# Patient Record
Sex: Female | Born: 1940 | Race: White | Hispanic: No | Smoking: Former smoker
Health system: Southern US, Community
[De-identification: ages and names within clinical notes are randomized; demographics above are authoritative.]

## PROBLEM LIST (undated history)

## (undated) DIAGNOSIS — E0500 Thyrotoxicosis with diffuse goiter without thyrotoxic crisis or storm: Secondary | ICD-10-CM

## (undated) DIAGNOSIS — I10 Essential (primary) hypertension: Secondary | ICD-10-CM

## (undated) DIAGNOSIS — R937 Abnormal findings on diagnostic imaging of other parts of musculoskeletal system: Secondary | ICD-10-CM

## (undated) DIAGNOSIS — Z720 Tobacco use: Secondary | ICD-10-CM

## (undated) DIAGNOSIS — K5792 Diverticulitis of intestine, part unspecified, without perforation or abscess without bleeding: Secondary | ICD-10-CM

## (undated) DIAGNOSIS — E0590 Thyrotoxicosis, unspecified without thyrotoxic crisis or storm: Secondary | ICD-10-CM

## (undated) DIAGNOSIS — K589 Irritable bowel syndrome without diarrhea: Secondary | ICD-10-CM

## (undated) DIAGNOSIS — M4800 Spinal stenosis, site unspecified: Secondary | ICD-10-CM

## (undated) DIAGNOSIS — M810 Age-related osteoporosis without current pathological fracture: Secondary | ICD-10-CM

## (undated) DIAGNOSIS — R000 Tachycardia, unspecified: Secondary | ICD-10-CM

## (undated) DIAGNOSIS — E162 Hypoglycemia, unspecified: Secondary | ICD-10-CM

## (undated) DIAGNOSIS — R002 Palpitations: Secondary | ICD-10-CM

## (undated) DIAGNOSIS — E785 Hyperlipidemia, unspecified: Secondary | ICD-10-CM

## (undated) DIAGNOSIS — Z780 Asymptomatic menopausal state: Secondary | ICD-10-CM

## (undated) DIAGNOSIS — E669 Obesity, unspecified: Secondary | ICD-10-CM

## (undated) HISTORY — PX: SHOULDER OPEN ROTATOR CUFF REPAIR: SHX2407

## (undated) HISTORY — DX: Thyrotoxicosis, unspecified without thyrotoxic crisis or storm: E05.90

## (undated) HISTORY — DX: Palpitations: R00.2

## (undated) HISTORY — PX: OTHER SURGICAL HISTORY: SHX169

## (undated) HISTORY — PX: APPENDECTOMY: SHX54

## (undated) HISTORY — DX: Abnormal findings on diagnostic imaging of other parts of musculoskeletal system: R93.7

## (undated) HISTORY — DX: Essential (primary) hypertension: I10

## (undated) HISTORY — DX: Asymptomatic menopausal state: Z78.0

## (undated) HISTORY — DX: Tobacco use: Z72.0

## (undated) HISTORY — PX: ABDOMINAL HYSTERECTOMY: SHX81

## (undated) HISTORY — PX: EYE SURGERY: SHX253

## (undated) HISTORY — DX: Hyperlipidemia, unspecified: E78.5

## (undated) HISTORY — DX: Age-related osteoporosis without current pathological fracture: M81.0

## (undated) HISTORY — DX: Thyrotoxicosis with diffuse goiter without thyrotoxic crisis or storm: E05.00

## (undated) HISTORY — DX: Irritable bowel syndrome, unspecified: K58.9

## (undated) HISTORY — PX: OOPHORECTOMY: SHX86

## (undated) HISTORY — DX: Obesity, unspecified: E66.9

## (undated) HISTORY — DX: Hypoglycemia, unspecified: E16.2

## (undated) HISTORY — DX: Diverticulitis of intestine, part unspecified, without perforation or abscess without bleeding: K57.92

## (undated) HISTORY — DX: Tachycardia, unspecified: R00.0

## (undated) HISTORY — PX: TONSILLECTOMY: SUR1361

---

## 2000-05-02 ENCOUNTER — Other Ambulatory Visit: Admission: RE | Admit: 2000-05-02 | Discharge: 2000-05-02 | Payer: Self-pay | Admitting: Obstetrics and Gynecology

## 2001-05-02 ENCOUNTER — Other Ambulatory Visit: Admission: RE | Admit: 2001-05-02 | Discharge: 2001-05-02 | Payer: Self-pay | Admitting: Obstetrics and Gynecology

## 2007-08-19 ENCOUNTER — Ambulatory Visit: Payer: Self-pay | Admitting: Internal Medicine

## 2007-10-29 ENCOUNTER — Encounter (HOSPITAL_COMMUNITY): Admission: RE | Admit: 2007-10-29 | Discharge: 2007-10-30 | Payer: Self-pay | Admitting: Internal Medicine

## 2007-11-02 ENCOUNTER — Emergency Department (HOSPITAL_COMMUNITY): Admission: EM | Admit: 2007-11-02 | Discharge: 2007-11-02 | Payer: Self-pay | Admitting: Emergency Medicine

## 2009-12-07 ENCOUNTER — Encounter (INDEPENDENT_AMBULATORY_CARE_PROVIDER_SITE_OTHER): Payer: Self-pay | Admitting: *Deleted

## 2009-12-08 ENCOUNTER — Ambulatory Visit: Payer: Self-pay | Admitting: Internal Medicine

## 2009-12-22 ENCOUNTER — Ambulatory Visit: Payer: Self-pay | Admitting: Internal Medicine

## 2010-05-02 ENCOUNTER — Encounter: Payer: Self-pay | Admitting: Internal Medicine

## 2010-05-11 ENCOUNTER — Encounter: Payer: Self-pay | Admitting: Endocrinology

## 2010-05-11 LAB — CONVERTED CEMR LAB: TSH: 0 microintl units/mL

## 2010-11-20 ENCOUNTER — Encounter: Payer: Self-pay | Admitting: Endocrinology

## 2010-11-21 ENCOUNTER — Other Ambulatory Visit (HOSPITAL_BASED_OUTPATIENT_CLINIC_OR_DEPARTMENT_OTHER): Payer: Self-pay | Admitting: Internal Medicine

## 2010-11-21 DIAGNOSIS — E0590 Thyrotoxicosis, unspecified without thyrotoxic crisis or storm: Secondary | ICD-10-CM

## 2010-11-21 NOTE — Letter (Signed)
Summary: Current Health Report/Patient  Current Health Report/Patient   Imported By: Sherian Rein 05/24/2010 09:15:50  _____________________________________________________________________  External Attachment:    Type:   Image     Comment:   External Document

## 2010-11-21 NOTE — Procedures (Signed)
Summary: Colonoscopy  Patient: Vanessa Boyd Note: All result statuses are Final unless otherwise noted.  Tests: (1) Colonoscopy (COL)   COL Colonoscopy           DONE     [facility]     [address]     [place], [place]  [address]           COLONOSCOPY PROCEDURE REPORT           PATIENT:  Vanessa Boyd, Vanessa Boyd  MR#:  [id number]     BIRTHDATE:  16-Sep-1941, 68 yrs. old  GENDER:  female           ENDOSCOPIST:  Hedwig Morton. Juanda Chance, MD     Referred by:  Jarome Matin, M.D.           PROCEDURE DATE:  12/22/2009     PROCEDURE:  Colonoscopy 19147     ASA CLASS:  Class II     INDICATIONS:  Routine Risk Screening           MEDICATIONS:   Versed 10 mg, Fentanyl 100 mcg           DESCRIPTION OF PROCEDURE:   After the risks benefits and     alternatives of the procedure were thoroughly explained, informed     consent was obtained.  Digital rectal exam was performed and     revealed no rectal masses.   The LB [id number]-[id number] endoscope     was introduced through the anus and advanced to the cecum, which     was identified by both the appendix and ileocecal valve, without     limitations.  The quality of the prep was good, using MiraLax.     The instrument was then slowly withdrawn as the colon was fully     examined.     <<PROCEDUREIMAGES>>           FINDINGS:  Moderate diverticulosis was found throughout the colon     (see image1 and image2). large haustral folds, narrow lumen with     spasm  This was otherwise a normal examination of the colon (see     image8, image7, image4, and image5).   Retroflexed views in the     rectum revealed no abnormalities.    The scope was then withdrawn     from the patient and the procedure completed.           COMPLICATIONS:  None           ENDOSCOPIC IMPRESSION:     1) Moderate diverticulosis throughout the colon     2) Otherwise normal examination     RECOMMENDATIONS:     high fiber diet           REPEAT EXAM:  In 10 year(s) for.        ______________________________     Hedwig Morton. Juanda Chance, MD           CC:           n.     eSIGNED:   Hedwig Morton. Brodie at 12/22/2009 10:47 AM           Linares, Beaulah Corin, [phone]  Note: An exclamation mark (!) indicates a result that was not dispersed into the flowsheet. Document Creation Date: 12/22/2009 10:47 AM _______________________________________________________________________  (1) Order result status: Final Collection or observation date-time: 12/22/2009 10:40 Requested date-time:  Receipt date-time:  Reported date-time:  Referring Physician:   Ordering Physician: Lina Sar [phone]) Specimen  Source:  Source: Launa Grill Order Number: [id number] Lab site:   Appended Document: Colonoscopy    Clinical Lists Changes  Observations: Added new observation of COLONNXTDUE: 12/2019 (12/22/2009 13:14)

## 2010-11-21 NOTE — Miscellaneous (Signed)
Summary: [facility] Previsit/prep  Clinical Lists Changes  Medications: Added new medication of DULCOLAX 5 MG  TBEC (BISACODYL) Day before procedure take 2 at 3pm and 2 at 8pm. - Signed Added new medication of METOCLOPRAMIDE HCL 10 MG  TABS (METOCLOPRAMIDE HCL) As per prep instructions. - Signed Added new medication of MIRALAX   POWD (POLYETHYLENE GLYCOL 3350) As per prep  instructions. - Signed Rx of DULCOLAX 5 MG  TBEC (BISACODYL) Day before procedure take 2 at 3pm and 2 at 8pm.;  #4 x 0;  Signed;  Entered by: Wyona Almas RN;  Authorized by: Hart Carwin MD;  Method used: [facility] to [facility]  [phone]*, [address] [place], [place]  [address], Ph: [phone] or [phone], Fax: [phone] Rx of METOCLOPRAMIDE HCL 10 MG  TABS (METOCLOPRAMIDE HCL) As per prep instructions.;  #2 x 0;  Signed;  Entered by: Wyona Almas RN;  Authorized by: Hart Carwin MD;  Method used: Electronically to [facility]  [phone]*, [address] [place], [place]  [address], Ph: [phone] or [phone], Fax: [phone] Rx of MIRALAX   POWD (POLYETHYLENE GLYCOL 3350) As per prep  instructions.;  #255gm x 0;  Signed;  Entered by: Wyona Almas RN;  Authorized by: Hart Carwin MD;  Method used: Electronically to [facility]  [phone]*, [address] [place], [place]  [address], Ph: [phone] or [phone], Fax: [phone] Allergies: Added new allergy or adverse reaction of CODEINE Observations: Added new observation of NKA: F (12/08/2009 15:22)    Prescriptions: MIRALAX   POWD (POLYETHYLENE GLYCOL 3350) As per prep  instructions.  #255gm x 0   Entered by:   Wyona Almas RN   Authorized by:   Hart Carwin MD   Signed by:   Wyona Almas RN on 12/08/2009   Method used:   Electronically to        [facility]  [phone]* (retail)       [address] [place], [place]  [address]       Ph: [phone] or [phone]       Fax: [phone]   RxID:    [id number] METOCLOPRAMIDE HCL 10 MG  TABS (METOCLOPRAMIDE HCL) As per prep instructions.  #2 x 0   Entered by:   Wyona Almas RN   Authorized by:   Hart Carwin MD   Signed by:   Wyona Almas RN on 12/08/2009   Method used:   Electronically to        [facility]  [phone]* (retail)       [address] [place], [place]  [address]       Ph: [phone] or [phone]       Fax: [phone]   RxID:   [id number] DULCOLAX 5 MG  TBEC (BISACODYL) Day before procedure take 2 at 3pm and 2 at 8pm.  #4 x 0   Entered by:   Wyona Almas RN   Authorized by:   Hart Carwin MD   Signed by:   Wyona Almas RN on 12/08/2009   Method used:   Electronically to        [facility]  [phone]* (retail)       [address] [place], [place]  [address]       Ph: [phone] or [phone]       Fax: [phone]  RxID:   [id number]

## 2010-11-21 NOTE — Letter (Signed)
Summary: [facility] Instructions  [facility]  [address] [place], [place] [address]   Phone: [phone]  Fax: [phone]       Vanessa Boyd    April 19, 1941    MRN: [id number]       Procedure Day Dorna Bloom:  Lenor Coffin  12/22/09     Arrival Time:  9:00AM     Procedure Time:  10:00AM     Location of Procedure:                    _ X_  [facility] (4th Floor)   PREPARATION FOR COLONOSCOPY WITH MIRALAX  Starting 5 days prior to your procedure 12/17/09 do not eat nuts, seeds, popcorn, corn, beans, peas,  salads, or any raw vegetables.  Do not take any fiber supplements (e.g. Metamucil, Citrucel, and Benefiber). ____________________________________________________________________________________________________   THE DAY BEFORE YOUR PROCEDURE         DATE: 12/21/09  DAY: WEDNESDAY  1   Drink clear liquids the entire day-NO SOLID FOOD  2   Do not drink anything colored red or purple.  Avoid juices with pulp.  No orange juice.  3   Drink at least 64 oz. (8 glasses) of fluid/clear liquids during the day to prevent dehydration and help the prep work efficiently.  CLEAR LIQUIDS INCLUDE: Water Jello Ice Popsicles Tea (sugar ok, no milk/cream) Powdered fruit flavored drinks Coffee (sugar ok, no milk/cream) Gatorade Juice: apple, white grape, white cranberry  Lemonade Clear bullion, consomm, broth Carbonated beverages (any kind) Strained chicken noodle soup Hard Candy  4   Mix the entire bottle of Miralax with 64 oz. of Gatorade/Powerade in the morning and put in the refrigerator to chill.  5   At 3:00 pm take 2 Dulcolax/Bisacodyl tablets.  6   At 4:30 pm take one Reglan/Metoclopramide tablet.  7  Starting at 5:00 pm drink one 8 oz glass of the Miralax mixture every 15-20 minutes until you have finished drinking the entire 64 oz.  You should finish drinking prep around 7:30 or 8:00 pm.  8   If you are nauseated, you may take the 2nd Reglan/Metoclopramide  tablet at 6:30 pm.        9    At 8:00 pm take 2 more DULCOLAX/Bisacodyl tablets.     THE DAY OF YOUR PROCEDURE      DATE:  12/22/09  DAY: Lenor Coffin  You may drink clear liquids until 8:00AM  (2 HOURS BEFORE PROCEDURE).   MEDICATION INSTRUCTIONS  Unless otherwise instructed, you should take regular prescription medications with a small sip of water as early as possible the morning of your procedure.           OTHER INSTRUCTIONS  You will need a responsible adult at least 68 years of age to accompany you and drive you home.   This person must remain in the waiting room during your procedure.  Wear loose fitting clothing that is easily removed.  Leave jewelry and other valuables at home.  However, you may wish to bring a book to read or an iPod/MP3 player to listen to music as you wait for your procedure to start.  Remove all body piercing jewelry and leave at home.  Total time from sign-in until discharge is approximately 2-3 hours.  You should go home directly after your procedure and rest.  You can resume normal activities the day after your procedure.  The day of your procedure you should not:  Drive   Make legal decisions   Operate machinery   Drink alcohol   Return to work  You will receive specific instructions about eating, activities and medications before you leave.   The above instructions have been reviewed and explained to me by   Wyona Almas RN  December 08, 2009 4:04 PM     I fully understand and can verbalize these instructions _____________________________ Date _______

## 2010-12-04 ENCOUNTER — Ambulatory Visit (HOSPITAL_COMMUNITY)
Admission: RE | Admit: 2010-12-04 | Discharge: 2010-12-04 | Disposition: A | Discharge status: Home / Self Care | Payer: Medicare Other | Source: Ambulatory Visit | Attending: Internal Medicine | Admitting: Internal Medicine

## 2010-12-04 DIAGNOSIS — E0590 Thyrotoxicosis, unspecified without thyrotoxic crisis or storm: Secondary | ICD-10-CM

## 2010-12-05 ENCOUNTER — Ambulatory Visit (HOSPITAL_COMMUNITY): Payer: Self-pay

## 2010-12-13 ENCOUNTER — Encounter: Payer: Self-pay | Admitting: Endocrinology

## 2010-12-13 DIAGNOSIS — E0590 Thyrotoxicosis, unspecified without thyrotoxic crisis or storm: Secondary | ICD-10-CM | POA: Insufficient documentation

## 2010-12-13 DIAGNOSIS — R002 Palpitations: Secondary | ICD-10-CM | POA: Insufficient documentation

## 2010-12-13 DIAGNOSIS — M810 Age-related osteoporosis without current pathological fracture: Secondary | ICD-10-CM | POA: Insufficient documentation

## 2010-12-13 DIAGNOSIS — I10 Essential (primary) hypertension: Secondary | ICD-10-CM | POA: Insufficient documentation

## 2010-12-13 DIAGNOSIS — E162 Hypoglycemia, unspecified: Secondary | ICD-10-CM | POA: Insufficient documentation

## 2010-12-18 ENCOUNTER — Other Ambulatory Visit: Payer: Medicare Other

## 2010-12-18 ENCOUNTER — Ambulatory Visit (INDEPENDENT_AMBULATORY_CARE_PROVIDER_SITE_OTHER): Payer: Medicare Other | Admitting: Endocrinology

## 2010-12-18 ENCOUNTER — Encounter: Payer: Self-pay | Admitting: Endocrinology

## 2010-12-18 ENCOUNTER — Other Ambulatory Visit: Payer: Self-pay | Admitting: Endocrinology

## 2010-12-18 DIAGNOSIS — E0590 Thyrotoxicosis, unspecified without thyrotoxic crisis or storm: Secondary | ICD-10-CM

## 2010-12-18 LAB — TSH: TSH: 0.01 u[IU]/mL — ABNORMAL LOW (ref 0.35–5.50)

## 2010-12-18 LAB — T4, FREE: Free T4: 1.06 ng/dL (ref 0.60–1.60)

## 2010-12-19 ENCOUNTER — Other Ambulatory Visit: Payer: Self-pay | Admitting: Endocrinology

## 2010-12-19 DIAGNOSIS — E0590 Thyrotoxicosis, unspecified without thyrotoxic crisis or storm: Secondary | ICD-10-CM

## 2010-12-28 NOTE — Assessment & Plan Note (Signed)
Summary: New Endo/[employer]/[facility]terson/overactive thyroid/cd--will fax o...   Vital Signs:  Patient profile:   70 year old female Height:      60 inches (152.40 cm) Weight:      167.4 pounds (76.09 kg) BMI:     32.81 O2 Sat:      97 % on Room air Temp:     98.1 degrees F (36.72 degrees C) oral Pulse rate:   80 / minute BP sitting:   120 / 72  (left arm) Cuff size:   regular  Vitals Entered By: Orlan Leavens RMA (December 18, 2010 2:07 PM)  O2 Flow:  Room air CC: New Endo   Referring Provider:  patterson  CC:  New Endo.  History of Present Illness: pt says she was dx'ed with hyperthyroidism.   she was rx'ed with increasing dosages of ptu, but sxs persist.  she went for a scan a few weeks ago, but it could not be done, as she was on ptu then.   current sxs are few mos of moderate palpitations in the chest, and assoc heat intolerance.  sxs have improved with increasing dosage of ptu.    Current Medications (verified): 1)  Atenolol 50 Mg Tabs (Atenolol) .Marland Kitchen.. 1 Tablet By Mouth Once Daily 2)  Caltrate 600+d Plus 600-400 Mg-Unit Chew (Calcium Carbonate-Vit D-Min) .Marland Kitchen.. 1 Tablet By Mouth Three Times A Day 3)  Multivitamins  Tabs (Multiple Vitamin) .Marland Kitchen.. 1 Tablet By Mouth Once Daily 4)  Propylthiouracil 50 Mg Tabs (Propylthiouracil) .... 4 Tablets Once Daily 5)  Krill Oil 1000 Mg Caps (Krill Oil) .Marland Kitchen.. 1 Capsule By Mouth Once Daily 6)  Vitamin D3 1000 Unit Caps (Cholecalciferol) .Marland Kitchen.. 1 Tablet By Mouth Once Daily 7)  Lovastatin 20 Mg Tabs (Lovastatin) .... Take 1 At Bedtime  Allergies (verified): 1)  ! Codeine  Past History:  Past Medical History: UNSPECIFIED ESSENTIAL HYPERTENSION (ICD-401.9) PALPITATIONS (ICD-785.1) HYPOGLYCEMIA, UNSPECIFIED (ICD-251.2) OSTEOPOROSIS (ICD-733.00) HYPERTHYROIDISM (ICD-242.90)  Family History: Reviewed history from 12/13/2010 and no changes required. Family History High cholesterol uvertain if any fhx of thyroid dz  Social History: Reviewed  history from 12/13/2010 and no changes required. Retired Current Smoker widowed 1985  Review of Systems       denies visual loss, sob, diarrhea, n/v, polyuria, muscle weakness, excessive diaphoresis, tremor, anxiety, menopausal sxs, hypoglycemia, easy bruising, and rhinorrhea   Physical Exam  General:  normal appearance.   Head:  head: no deformity eyes: no periorbital swelling, no proptosis external nose and ears are normal mouth: no lesion seen Neck:  several times normal size  thyroid, right > left Lungs:  Clear to auscultation bilaterally. Normal respiratory effort.  Heart:  Regular rate and rhythm without gallops noted. Normal S1,S2.  there is a soft systolic murmur Abdomen:  abdomen is soft, nontender.  no hepatosplenomegaly.   not distended.  no hernia  Msk:  muscle bulk and strength are grossly normal.  no obvious joint swelling.  gait is normal and steady  Pulses:  dorsalis pedis intact bilat.    Extremities:  no deformity.  no ulcer on the feet.  feet are of normal color and temp.  no edema  Neurologic:  cn 2-12 grossly intact.   readily moves all 4's.   sensation is intact to touch on all 4's no tremor Skin:  normal texture and temp.  no rash.  not diaphoretic  Cervical Nodes:  No significant adenopathy.  Psych:  Alert and cooperative; normal mood and affect; normal attention span and concentration.  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 Take 1 at bedtime  Other Orders: TLB-TSH (Thyroid Stimulating Hormone) (84443-TSH) TLB-T4 (Thyrox), Free [phone]) i-131 Thyroid uptake and scan (Capsule & Scan) Nuc Med Diagnostic (thyroid uptake & sca) Consultation Level IV (40981)  Patient Instructions: 1)  blood tests are being ordered for you today.  please call [phone] to hear your test results. 2)  pending the test results, please stop the propylthiouracil, but please continue the atenolol. 3)  (update: i left message on phone-tree:stay-off the ptu.  i have ordered a scan for the next 1-2 weeks).   Orders Added: 1)  TLB-TSH (Thyroid Stimulating Hormone) [84443-TSH] 2)  TLB-T4 (Thyrox), Free [95621-HY8M] 3)  i-131 Thyroid uptake and scan (Capsule & Scan) Nuc Med Diagnostic [thyroid uptake & sca] 4)  Consultation Level IV [57846]

## 2010-12-28 NOTE — Letter (Signed)
Summary: [facility]  [facility]   Imported By: Lennie Odor 12/21/2010 14:52:09  _____________________________________________________________________  External Attachment:    Type:   Image     Comment:   External Document

## 2011-01-02 ENCOUNTER — Encounter (HOSPITAL_COMMUNITY)
Admission: RE | Admit: 2011-01-02 | Discharge: 2011-01-02 | Disposition: A | Discharge status: Home / Self Care | Payer: Medicare Other | Source: Ambulatory Visit | Attending: Endocrinology | Admitting: Endocrinology

## 2011-01-02 DIAGNOSIS — E0500 Thyrotoxicosis with diffuse goiter without thyrotoxic crisis or storm: Secondary | ICD-10-CM | POA: Insufficient documentation

## 2011-01-02 DIAGNOSIS — E0590 Thyrotoxicosis, unspecified without thyrotoxic crisis or storm: Secondary | ICD-10-CM

## 2011-01-03 ENCOUNTER — Ambulatory Visit (HOSPITAL_COMMUNITY)
Admission: RE | Admit: 2011-01-03 | Discharge: 2011-01-03 | Disposition: A | Discharge status: Home / Self Care | Payer: Medicare Other | Source: Ambulatory Visit | Attending: Endocrinology | Admitting: Endocrinology

## 2011-01-03 MED ORDER — SODIUM PERTECHNETATE TC 99M INJECTION
10.3000 | Freq: Once | INTRAVENOUS | Status: AC | PRN
  Administered 2011-01-03: 10.3 via INTRAVENOUS

## 2011-01-03 MED ORDER — SODIUM IODIDE I 131 CAPSULE
9.5000 | Freq: Once | INTRAVENOUS | Status: AC | PRN
  Administered 2011-01-02: 9.5 via ORAL

## 2011-01-05 ENCOUNTER — Other Ambulatory Visit: Payer: Self-pay | Admitting: Endocrinology

## 2011-01-05 DIAGNOSIS — E0590 Thyrotoxicosis, unspecified without thyrotoxic crisis or storm: Secondary | ICD-10-CM

## 2011-01-12 ENCOUNTER — Encounter (HOSPITAL_COMMUNITY)
Admission: RE | Admit: 2011-01-12 | Discharge: 2011-01-12 | Disposition: A | Discharge status: Home / Self Care | Payer: Medicare Other | Source: Ambulatory Visit | Attending: Endocrinology | Admitting: Endocrinology

## 2011-01-12 DIAGNOSIS — E0590 Thyrotoxicosis, unspecified without thyrotoxic crisis or storm: Secondary | ICD-10-CM | POA: Insufficient documentation

## 2011-01-19 ENCOUNTER — Encounter (HOSPITAL_COMMUNITY)
Admission: RE | Admit: 2011-01-19 | Discharge: 2011-01-19 | Disposition: A | Discharge status: Home / Self Care | Payer: Medicare Other | Source: Ambulatory Visit | Attending: Endocrinology | Admitting: Endocrinology

## 2011-01-19 DIAGNOSIS — E0590 Thyrotoxicosis, unspecified without thyrotoxic crisis or storm: Secondary | ICD-10-CM | POA: Insufficient documentation

## 2011-01-19 MED ORDER — SODIUM IODIDE I 131 CAPSULE
21.5000 | Freq: Once | INTRAVENOUS | Status: AC | PRN
  Administered 2011-01-19: 21.5 via ORAL

## 2011-02-05 ENCOUNTER — Ambulatory Visit (INDEPENDENT_AMBULATORY_CARE_PROVIDER_SITE_OTHER): Payer: Medicare Other | Admitting: Internal Medicine

## 2011-02-05 ENCOUNTER — Encounter: Payer: Self-pay | Admitting: Internal Medicine

## 2011-02-05 ENCOUNTER — Telehealth: Payer: Self-pay | Admitting: *Deleted

## 2011-02-05 VITALS — BP 124/72 | HR 101 | Temp 98.7°F | Ht 60.0 in | Wt 164.0 lb

## 2011-02-05 DIAGNOSIS — Z23 Encounter for immunization: Secondary | ICD-10-CM

## 2011-02-05 DIAGNOSIS — E0590 Thyrotoxicosis, unspecified without thyrotoxic crisis or storm: Secondary | ICD-10-CM

## 2011-02-05 DIAGNOSIS — I10 Essential (primary) hypertension: Secondary | ICD-10-CM

## 2011-02-05 DIAGNOSIS — Z136 Encounter for screening for cardiovascular disorders: Secondary | ICD-10-CM

## 2011-02-05 MED ORDER — ZOSTER VACCINE LIVE 19400 UNT/0.65ML ~~LOC~~ SOLR: 0.6500 mL | Freq: Once | SUBCUTANEOUS | Status: DC

## 2011-02-05 NOTE — Telephone Encounter (Signed)
Pt ha had her i-131 rx--good.  F/u here < 1  month

## 2011-02-05 NOTE — Progress Notes (Signed)
Subjective:    Patient ID: Vanessa Boyd, female    DOB: 05/21/1941, 70 y.o.   MRN: [id number]  HPI Patient presents to establish for on-going primary continuity care. She has been seeing Dr. Juanda Chance for GI and Dr. Everardo All for thyroid management.  She is doing well and feels medically stable at this time.   Past Medical History  Diagnosis Date  . Hypertension   . Palpitations   . Hypoglycemia, unspecified   . Abnormal bone density screening   . Hyperthyroidism    Past Surgical History  Procedure Date  . Appendectomy   . Tonsillectomy   . Abdominal hysterectomy   . Oophorectomy   . Shoulder open rotator cuff repair   . Orif left elbow   . Eye surgery     bilateral cataract surgery   Family History  Problem Relation Age of Onset  . COPD Mother   . Hyperlipidemia Mother   . Hypertension Sister   . Hyperlipidemia Sister   . Diabetes Neg Hx   . Heart disease Neg Hx    History   Social History  . Marital Status: Widowed    Spouse Name: N/A    Number of Children: 3  . Years of Education: 12   Occupational History  . travel agent     retired   Social History Main Topics  . Smoking status: Former Smoker -- 20 years    Types: Cigarettes    Quit date: 10/22/1998  . Smokeless tobacco: Not on file  . Alcohol Use: 1.0 oz/week    2 drink(s) per week  . Drug Use: No  . Sexually Active: Not Currently   Other Topics Concern  . Not on file   Social History Narrative   Married '62 - widowed '85; 3 sons - '63, '64, '66; 6 grandchildren. HSG. 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 Has a good support network. End- of- Life: OK for CPR, no prolonged ventilator support, no prolong heroic or futile measures.      Review of Systems Review of Systems  Constitutional:  Negative for fever, chills, activity change and unexpected weight change.  HENT:  Negative for hearing loss, ear pain, congestion, neck stiffness and postnasal drip.  Full dentures   Eyes: Negative for pain, discharge and visual disturbance.  Respiratory: Negative for chest tightness and wheezing.   Cardiovascular: Negative for chest pain and palpitations.       [No decreased exercise tolerance Gastrointestinal: [No change in bowel habit. No bloating or gas. No reflux or indigestion Genitourinary: Negative for urgency, frequency, flank pain and difficulty urinating.  Musculoskeletal: Negative for myalgias, back pain, arthralgias and gait problem.  Neurological: Negative for dizziness, tremors, weakness and headaches.  Hematological: Negative for adenopathy.  Psychiatric/Behavioral: Negative for behavioral problems and dysphoric mood.       Objective:   Physical Exam WNWD white woman, mildly overweight in no distress HEENT- Normal cephalic, atraumatic; exernal ears normal, EACs/TMs normal; C&S clear - right eye is more prominent then left, Pupils equal, round and reactvie; oropharynx with full dentures, no buccal lesions, posterior pharynx is clear. Neck - supple with normal rane of motion, no thyromegaly or thyroid nodule palpable Nodes - no enlarged lymph nodes cervical, supraclavicular Chest - no CVAT, Lungs - clear to auscultation and percussion Heart - regular peripheral pulse - radial; quiet precordium; regular rate and rhythym Abd - BS positive Extremities - without deformity. Neuro- awake, alert and  oriented; speech clear; cognition normal.       Assessment & Plan:  1. Hyperthyroidism - patient with mild right exopthalmos. Normal neck exam. She is s/p radioactive thyroid ablation.  Plan - per Dr. Everardo All  2. Hypertension - good control on present medication.  3. Health maintenance - she will return for a wellness exam. She will obtain old labs prior to reordering any studies.  In summary - a very nice woman who is established for primary care.

## 2011-02-05 NOTE — Telephone Encounter (Signed)
Pt here for New pt visit with Dr Debby Bud. She was seen as  New endo pt back in feb. She would like to know what the Next step will be after having thyroid uptake and if she should take lovastatin and atenolol. Please Advise. Thank you

## 2011-02-06 NOTE — Telephone Encounter (Signed)
Pt advised via VM, told to call back with any further questions up until appt

## 2011-02-26 ENCOUNTER — Telehealth: Payer: Self-pay

## 2011-02-26 NOTE — Telephone Encounter (Signed)
Pt called requesting MD advise on when and if follow up appt was needed after having I-131 therapy 03/30, Please advise

## 2011-02-26 NOTE — Telephone Encounter (Signed)
Within next few weeks 

## 2011-02-26 NOTE — Telephone Encounter (Signed)
Pt advised and transferred to schedule appt.  

## 2011-03-02 ENCOUNTER — Encounter: Payer: Self-pay | Admitting: Endocrinology

## 2011-03-02 ENCOUNTER — Other Ambulatory Visit (INDEPENDENT_AMBULATORY_CARE_PROVIDER_SITE_OTHER): Payer: Medicare Other

## 2011-03-02 ENCOUNTER — Ambulatory Visit (INDEPENDENT_AMBULATORY_CARE_PROVIDER_SITE_OTHER): Payer: Medicare Other | Admitting: Endocrinology

## 2011-03-02 VITALS — BP 108/66 | HR 70 | Temp 98.7°F | Ht 60.0 in | Wt 161.4 lb

## 2011-03-02 DIAGNOSIS — E0590 Thyrotoxicosis, unspecified without thyrotoxic crisis or storm: Secondary | ICD-10-CM

## 2011-03-02 NOTE — Progress Notes (Signed)
Subjective:    Patient ID: Vanessa Boyd, female    DOB: 01-21-1941, 70 y.o.   MRN: [id number]  HPI Pt is now 5 weeks s/p i-131 rx, for hyperthyroidism due to grave's dz, right sided.  She has intermittent excessive diaphoresis, but no fever.   Past Medical History  Diagnosis Date  . Hypertension   . Palpitations   . Hypoglycemia, unspecified   . Abnormal bone density screening   . Hyperthyroidism     Past Surgical History  Procedure Date  . Appendectomy   . Tonsillectomy   . Abdominal hysterectomy   . Oophorectomy   . Shoulder open rotator cuff repair   . Orif left elbow   . Eye surgery     bilateral cataract surgery    History   Social History  . Marital Status: Widowed    Spouse Name: N/A    Number of Children: 3  . Years of Education: 12   Occupational History  . travel agent     retired   Social History Main Topics  . Smoking status: Former Smoker -- 20 years    Types: Cigarettes    Quit date: 10/22/1998  . Smokeless tobacco: Not on file  . Alcohol Use: 1.0 oz/week    2 drink(s) per week  . Drug Use: No  . Sexually Active: Not Currently   Other Topics Concern  . Not on file   Social History Narrative   Married '62 - widowed '85; 3 sons - '63, '64, '66; 6 grandchildren. HSG. 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 Has a good support network. End- of- Life: OK for CPR, no prolonged ventilator support, no prolong heroic or futile measures.     Current Outpatient Prescriptions on File Prior to Visit  Medication Sig Dispense Refill  . atenolol (TENORMIN) 50 MG tablet Take 50 mg by mouth daily.       . Calcium Carbonate-Vitamin D (CALTRATE 600+D) 600-400 MG-UNIT per tablet Take 1 tablet by mouth daily.        Marland Kitchen KRILL OIL 1000 MG CAPS Take by mouth.        . MULTIPLE VITAMIN PO Take by mouth.        . Omega-3 Fat Ac-Cholecalciferol (MINICAPS VITAMIN-D/OMEGA-3) [phone] MG-UNIT CAPS Take by mouth.        .  lovastatin (MEVACOR) 10 MG tablet Take 10 mg by mouth at bedtime.         Current Facility-Administered Medications on File Prior to Visit  Medication Dose Route Frequency Provider Last Rate Last Dose  . zoster vaccine live (PF) (ZOSTAVAX) injection 19,400 Units  0.65 mL Subcutaneous Once Duke Salvia, MD        Allergies  Allergen Reactions  . Codeine     REACTION: nausea/vomiting    Family History  Problem Relation Age of Onset  . COPD Mother   . Hyperlipidemia Mother   . Hypertension Sister   . Hyperlipidemia Sister   . Diabetes Neg Hx   . Heart disease Neg Hx     BP 108/66  Pulse 70  Temp(Src) 98.7 F (37.1 C) (Oral)  Ht 5' (1.524 m)  Wt 161 lb 6.4 oz (73.211 kg)  BMI 31.52 kg/m2  SpO2 96%    Review of Systems Denies tremor and palpitations.     Objective:   Physical Exam GENERAL: no distress Neck - No masses.  There is slight thyromegaly on the right  lobe, but none on the left.   Skin:  Not diaphoretic    Lab Results  Component Value Date   TSH 0.01* 03/02/2011   Assessment & Plan:  Hyperthyroidism.  Not better yet.

## 2011-03-02 NOTE — Patient Instructions (Addendum)
blood tests are being ordered for you today.  please call [phone] to hear your test results.  You will be prompted to enter the 9-digit "MRN" number that appears at the top left of this page, followed by #.  Then you will hear the message. Please make a follow-up appointment in 5-6 weeks. Cc dr Jarold Motto (update: i left message on phone-tree:  rx as we discussed)

## 2011-03-05 ENCOUNTER — Telehealth: Payer: Self-pay

## 2011-03-05 NOTE — Telephone Encounter (Signed)
Thyroid is not better yet.  Ret as scheduled, and we'll recheck then.

## 2011-03-05 NOTE — Telephone Encounter (Signed)
Pt called requesting results of recent labs. Pt states phone tree message states results not available (I verified).

## 2011-03-06 NOTE — Telephone Encounter (Signed)
Pt advised and will keep ROV as scheduled

## 2011-04-19 ENCOUNTER — Ambulatory Visit (INDEPENDENT_AMBULATORY_CARE_PROVIDER_SITE_OTHER): Payer: Medicare Other | Admitting: Endocrinology

## 2011-04-19 ENCOUNTER — Encounter: Payer: Self-pay | Admitting: Endocrinology

## 2011-04-19 ENCOUNTER — Other Ambulatory Visit (INDEPENDENT_AMBULATORY_CARE_PROVIDER_SITE_OTHER): Payer: Medicare Other

## 2011-04-19 VITALS — BP 110/66 | HR 61 | Temp 98.6°F | Ht 60.0 in | Wt 161.0 lb

## 2011-04-19 DIAGNOSIS — E0590 Thyrotoxicosis, unspecified without thyrotoxic crisis or storm: Secondary | ICD-10-CM

## 2011-04-19 MED ORDER — ATENOLOL 25 MG PO TABS: 25.0000 mg | ORAL_TABLET | Freq: Every day | ORAL | Status: DC

## 2011-04-19 NOTE — Progress Notes (Signed)
  Subjective:    Patient ID: Vanessa Boyd, female    DOB: 1940/10/31, 70 y.o.   MRN: [id number]  HPI Pt is now 3 months s/p i-131 rx, for hyperthyroidism due to grave's dz, right sided. She has intermittent fatigue and palpitations. She takes tenormin as rx'ed.  Past Medical History  Diagnosis Date  . Hypertension   . Palpitations   . Hypoglycemia, unspecified   . Abnormal bone density screening   . Hyperthyroidism     Past Surgical History  Procedure Date  . Appendectomy   . Tonsillectomy   . Abdominal hysterectomy   . Oophorectomy   . Shoulder open rotator cuff repair   . Orif left elbow   . Eye surgery     bilateral cataract surgery    History   Social History  . Marital Status: Widowed    Spouse Name: N/A    Number of Children: 3  . Years of Education: 12   Occupational History  . travel agent     retired   Social History Main Topics  . Smoking status: Former Smoker -- 20 years    Types: Cigarettes    Quit date: 10/22/1998  . Smokeless tobacco: Not on file  . Alcohol Use: 1.0 oz/week    2 drink(s) per week  . Drug Use: No  . Sexually Active: Not Currently   Other Topics Concern  . Not on file   Social History Narrative   Married '62 - widowed '85; 3 sons - '63, '64, '66; 6 grandchildren. HSG. 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 Has a good support network. End- of- Life: OK for CPR, no prolonged ventilator support, no prolong heroic or futile measures.     Current Outpatient Prescriptions on File Prior to Visit  Medication Sig Dispense Refill  . Calcium Carbonate-Vitamin D (CALTRATE 600+D) 600-400 MG-UNIT per tablet Take 1 tablet by mouth 2 (two) times daily.       Marland Kitchen KRILL OIL 1000 MG CAPS Take by mouth.        . Omega-3 Fat Ac-Cholecalciferol (MINICAPS VITAMIN-D/OMEGA-3) [phone] MG-UNIT CAPS Take by mouth.         Current Facility-Administered Medications on File Prior to Visit  Medication Dose Route  Frequency Provider Last Rate Last Dose  . zoster vaccine live (PF) (ZOSTAVAX) injection 19,400 Units  0.65 mL Subcutaneous Once Duke Salvia, MD        Allergies  Allergen Reactions  . Codeine     REACTION: nausea/vomiting    Family History  Problem Relation Age of Onset  . COPD Mother   . Hyperlipidemia Mother   . Hypertension Sister   . Hyperlipidemia Sister   . Diabetes Neg Hx   . Heart disease Neg Hx     BP 110/66  Pulse 61  Temp(Src) 98.6 F (37 C) (Oral)  Ht 5' (1.524 m)  Wt 161 lb (73.029 kg)  BMI 31.44 kg/m2  SpO2 98%     Review of Systems Denies weight change.      Objective:   Physical Exam GENERAL: no distress Thyroid:  Twice normal size on the right; normal on the left.    Lab Results  Component Value Date   TSH 0.01 Repeated and verified X2.* 04/19/2011   Assessment & Plan:  Hyperthyroidism, not better yet.

## 2011-04-19 NOTE — Patient Instructions (Addendum)
blood tests are being ordered for you today.  please call [phone] to hear your test results.  You will be prompted to enter the 9-digit "MRN" number that appears at the top left of this page, followed by #.  Then you will hear the message. Please make a follow-up appointment in 1 month, with blood tests prior (i'll order these for you).   (update: i left message on phone-tree:  Not better yet).

## 2011-05-15 ENCOUNTER — Other Ambulatory Visit (INDEPENDENT_AMBULATORY_CARE_PROVIDER_SITE_OTHER): Payer: Medicare Other

## 2011-05-15 DIAGNOSIS — E0590 Thyrotoxicosis, unspecified without thyrotoxic crisis or storm: Secondary | ICD-10-CM

## 2011-05-15 LAB — TSH: TSH: 0.05 u[IU]/mL — ABNORMAL LOW (ref 0.35–5.50)

## 2011-05-15 LAB — T4, FREE: Free T4: 1.19 ng/dL (ref 0.60–1.60)

## 2011-05-17 ENCOUNTER — Ambulatory Visit (INDEPENDENT_AMBULATORY_CARE_PROVIDER_SITE_OTHER): Payer: Medicare Other | Admitting: Endocrinology

## 2011-05-17 ENCOUNTER — Encounter: Payer: Self-pay | Admitting: Endocrinology

## 2011-05-17 VITALS — BP 118/68 | HR 67 | Temp 97.4°F | Ht 60.0 in | Wt 164.8 lb

## 2011-05-17 DIAGNOSIS — E0590 Thyrotoxicosis, unspecified without thyrotoxic crisis or storm: Secondary | ICD-10-CM

## 2011-05-17 NOTE — Patient Instructions (Addendum)
Your thyroid needs more time to get better from the radioactive iodine treatment. Please make a follow-up appointment in 1 month, with blood tests a few days prior (i have ordered these for you).   Reduce the atenolol to 1/2 of 25 mg, daily. If your thyroid is still high next month, we should consider another radioactive iodine treatment.

## 2011-05-17 NOTE — Progress Notes (Signed)
Subjective:    Patient ID: Vanessa Boyd, female    DOB: [birth year], 70 y.o.   MRN: [id number]  HPI Pt is now 4 months s/p i-131 rx, for hyperthyroidism due to grave's dz, right sided.  She has few mos of slight intermittent palpitations in the chest, and assoc fatigue Past Medical History  Diagnosis Date  . Hypertension   . Palpitations   . Hypoglycemia, unspecified   . Abnormal bone density screening   . Hyperthyroidism     Past Surgical History  Procedure Date  . Appendectomy   . Tonsillectomy   . Abdominal hysterectomy   . Oophorectomy   . Shoulder open rotator cuff repair   . Orif left elbow   . Eye surgery     bilateral cataract surgery    History   Social History  . Marital Status: Widowed    Spouse Name: N/A    Number of Children: 3  . Years of Education: 12   Occupational History  . travel agent     retired   Social History Main Topics  . Smoking status: Former Smoker -- 20 years    Types: Cigarettes    Quit date: 10/22/1998  . Smokeless tobacco: Not on file  . Alcohol Use: 1.0 oz/week    2 drink(s) per week  . Drug Use: No  . Sexually Active: Not Currently   Other Topics Concern  . Not on file   Social History Narrative   Married '62 - widowed '85; 3 sons - '63, '64, '66; 6 grandchildren. HSG. 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 Has a good support network. End- of- Life: OK for CPR, no prolonged ventilator support, no prolong heroic or futile measures.     Current Outpatient Prescriptions on File Prior to Visit  Medication Sig Dispense Refill  . atenolol (TENORMIN) 25 MG tablet Take 1 tablet (25 mg total) by mouth daily.  30 tablet  2  . Calcium Carbonate-Vitamin D (CALTRATE 600+D) 600-400 MG-UNIT per tablet Take 1 tablet by mouth 2 (two) times daily.       . Cholecalciferol (VITAMIN D3) 1000 UNITS CAPS Take 1 capsule by mouth daily.        Marland Kitchen KRILL OIL 1000 MG CAPS Take by mouth.        .  metFORMIN (GLUCOPHAGE-XR) 500 MG 24 hr tablet Take 500 mg by mouth daily.        . Multiple Vitamins-Minerals (CENTRUM SILVER PO) Take 1 tablet by mouth daily.        . Naproxen Sodium (ALEVE) 220 MG CAPS Take by mouth as needed.        . Pseudoephedrine-Naproxen Na (ALEVE COLD & SINUS) 120-220 MG TB12 Take by mouth as needed.        . simvastatin (ZOCOR) 20 MG tablet Take 20 mg by mouth at bedtime.         Current Facility-Administered Medications on File Prior to Visit  Medication Dose Route Frequency Provider Last Rate Last Dose  . zoster vaccine live (PF) (ZOSTAVAX) injection 19,400 Units  0.65 mL Subcutaneous Once Duke Salvia, MD        Allergies  Allergen Reactions  . Codeine     REACTION: nausea/vomiting    Family History  Problem Relation Age of Onset  . COPD Mother   . Hyperlipidemia Mother   . Hypertension Sister   . Hyperlipidemia Sister   . Diabetes Neg Hx   .  Heart disease Neg Hx    BP 118/68  Pulse 67  Temp(Src) 97.4 F (36.3 C) (Oral)  Ht 5' (1.524 m)  Wt 164 lb 12.8 oz (74.753 kg)  BMI 32.19 kg/m2  SpO2 96%  Review of Systems Denies fever and weight change.    Objective:   Physical Exam GENERAL: no distress Neck: right thyroid lobe is approx 2x normal size.  The left is normal. Ext: no edema    Lab Results  Component Value Date   TSH 0.05* 05/15/2011  free T4=1.19  Assessment & Plan:  Hyperthyroidism due to grave's dz.  Not better yet.  Htn, still slightly overcontrolled. Palpitations, mild. prob due to hyperthyroidism.  Based on the mild nature of this symptom, this would not by itself  necessitate the tenormin.

## 2011-06-12 ENCOUNTER — Other Ambulatory Visit (INDEPENDENT_AMBULATORY_CARE_PROVIDER_SITE_OTHER): Payer: Medicare Other

## 2011-06-12 DIAGNOSIS — E0590 Thyrotoxicosis, unspecified without thyrotoxic crisis or storm: Secondary | ICD-10-CM

## 2011-06-12 LAB — T4, FREE: Free T4: 0.8 ng/dL (ref 0.60–1.60)

## 2011-06-12 LAB — TSH: TSH: 0.03 u[IU]/mL — ABNORMAL LOW (ref 0.35–5.50)

## 2011-06-15 ENCOUNTER — Ambulatory Visit (INDEPENDENT_AMBULATORY_CARE_PROVIDER_SITE_OTHER): Payer: Medicare Other | Admitting: Endocrinology

## 2011-06-15 ENCOUNTER — Encounter: Payer: Self-pay | Admitting: Endocrinology

## 2011-06-15 VITALS — BP 110/72 | HR 67 | Temp 98.3°F | Ht 60.0 in | Wt 164.0 lb

## 2011-06-15 DIAGNOSIS — E0590 Thyrotoxicosis, unspecified without thyrotoxic crisis or storm: Secondary | ICD-10-CM

## 2011-06-15 NOTE — Progress Notes (Signed)
Subjective:    Patient ID: Vanessa Boyd, female    DOB: [birth year], 70 y.o.   MRN: [id number]  HPI Pt is now 5 months s/p i-131 rx, for hyperthyroidism due to grave's dz, right sided. palpitations are much less now.  Fatigue is also less Past Medical History  Diagnosis Date  . Hypertension   . Palpitations   . Hypoglycemia, unspecified   . Abnormal bone density screening   . Hyperthyroidism     Past Surgical History  Procedure Date  . Appendectomy   . Tonsillectomy   . Abdominal hysterectomy   . Oophorectomy   . Shoulder open rotator cuff repair   . Orif left elbow   . Eye surgery     bilateral cataract surgery    History   Social History  . Marital Status: Widowed    Spouse Name: N/A    Number of Children: 3  . Years of Education: 12   Occupational History  . travel agent     retired   Social History Main Topics  . Smoking status: Former Smoker -- 20 years    Types: Cigarettes    Quit date: 10/22/1998  . Smokeless tobacco: Not on file  . Alcohol Use: 1.0 oz/week    2 drink(s) per week  . Drug Use: No  . Sexually Active: Not Currently   Other Topics Concern  . Not on file   Social History Narrative   Married '62 - widowed '85; 3 sons - '63, '64, '66; 6 grandchildren. HSG. 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 Has a good support network. End- of- Life: OK for CPR, no prolonged ventilator support, no prolong heroic or futile measures.     Current Outpatient Prescriptions on File Prior to Visit  Medication Sig Dispense Refill  . atenolol (TENORMIN) 25 MG tablet 1/2 tab daily       . Calcium Carbonate-Vitamin D (CALTRATE 600+D) 600-400 MG-UNIT per tablet Take 1 tablet by mouth 2 (two) times daily.       . Cholecalciferol (VITAMIN D3) 1000 UNITS CAPS Take 1 capsule by mouth daily.        Marland Kitchen KRILL OIL 1000 MG CAPS Take by mouth.        . metFORMIN (GLUCOPHAGE-XR) 500 MG 24 hr tablet Take 500 mg by mouth daily.         . Multiple Vitamins-Minerals (CENTRUM SILVER PO) Take 1 tablet by mouth daily.        . Naproxen Sodium (ALEVE) 220 MG CAPS Take by mouth as needed.        . Pseudoephedrine-Naproxen Na (ALEVE COLD & SINUS) 120-220 MG TB12 Take by mouth as needed.        . simvastatin (ZOCOR) 20 MG tablet Take 20 mg by mouth at bedtime.         Current Facility-Administered Medications on File Prior to Visit  Medication Dose Route Frequency Provider Last Rate Last Dose  . zoster vaccine live (PF) (ZOSTAVAX) injection 19,400 Units  0.65 mL Subcutaneous Once Duke Salvia, MD        Allergies  Allergen Reactions  . Codeine     REACTION: nausea/vomiting    Family History  Problem Relation Age of Onset  . COPD Mother   . Hyperlipidemia Mother   . Hypertension Sister   . Hyperlipidemia Sister   . Diabetes Neg Hx   . Heart disease Neg Hx    BP  110/72  Pulse 67  Temp(Src) 98.3 F (36.8 C) (Oral)  Ht 5' (1.524 m)  Wt 164 lb (74.39 kg)  BMI 32.03 kg/m2  SpO2 97% Review of Systems Denies weight change    Objective:   Physical Exam GENERAL: no distress Neck: there is thyroid enlargement on the right lobe only.  No thyroid nodule is palpable.  No palpable lymphadenopathy at the anterior neck.  Lab Results  Component Value Date   TSH 0.03* 06/12/2011      Assessment & Plan:  Hyperthyroidism, persistent despite i-131 rx

## 2011-06-15 NOTE — Patient Instructions (Addendum)
let's check another thyroid "scan" (a special, but easy and painless type of thyroid x ray).  It works like this: you go to the x-ray department of the hospital to swallow a pill, which contains a miniscule amount of radiation.  You will not notice any symptoms from this.  You will go back to the x-ray department the next day, to lie down in front of a camera.  The results of this will be sent to me.   Based on the results, i hope to order for you a nother treatment pill of radioactive iodine.  Although it is a larger amount of radiation, you will again notice no symptoms from this.  The pill is gone from your body in a few days (during which you should stay away from other people), but takes several months to work.  Therefore, please return here approximately 6 weeks after the treatment.  This treatment has been available for many years, and the only known side-effect is an underactive thyroid.  It is possible that i would eventually prescribe for you a thyroid hormone pill, which is very inexpensive.  You don't have to worry about side-effects of this thyroid hormone pill, because it is the same molecule your thyroid makes.

## 2011-07-04 ENCOUNTER — Encounter (HOSPITAL_COMMUNITY)
Admission: RE | Admit: 2011-07-04 | Discharge: 2011-07-04 | Disposition: A | Discharge status: Home / Self Care | Payer: Medicare Other | Source: Ambulatory Visit | Attending: Endocrinology | Admitting: Endocrinology

## 2011-07-04 DIAGNOSIS — E0590 Thyrotoxicosis, unspecified without thyrotoxic crisis or storm: Secondary | ICD-10-CM

## 2011-07-04 DIAGNOSIS — E049 Nontoxic goiter, unspecified: Secondary | ICD-10-CM | POA: Insufficient documentation

## 2011-07-05 ENCOUNTER — Telehealth: Payer: Self-pay | Admitting: Endocrinology

## 2011-07-05 ENCOUNTER — Encounter (HOSPITAL_COMMUNITY)
Admission: RE | Admit: 2011-07-05 | Discharge: 2011-07-05 | Disposition: A | Discharge status: Home / Self Care | Payer: Medicare Other | Source: Ambulatory Visit | Attending: Endocrinology | Admitting: Endocrinology

## 2011-07-05 DIAGNOSIS — E0590 Thyrotoxicosis, unspecified without thyrotoxic crisis or storm: Secondary | ICD-10-CM

## 2011-07-05 MED ORDER — SODIUM PERTECHNETATE TC 99M INJECTION
9.4000 | Freq: Once | INTRAVENOUS | Status: AC | PRN
  Administered 2011-07-05: 9 via INTRAVENOUS

## 2011-07-05 MED ORDER — SODIUM IODIDE I 131 CAPSULE
6.7000 | Freq: Once | INTRAVENOUS | Status: AC | PRN
  Administered 2011-07-04: 6.7 via ORAL

## 2011-07-05 NOTE — Telephone Encounter (Signed)
i left message on phone tree i ordered i-131 rx Ret 6 weeks

## 2011-07-12 LAB — BASIC METABOLIC PANEL
BUN: 13
CO2: 23
GFR calc non Af Amer: >60
Glucose, Bld: 126 — ABNORMAL HIGH
Potassium: 3.8

## 2011-07-12 LAB — CBC
HCT: 43.5
MCHC: 34.1
Platelets: 206
RDW: 12.2

## 2011-07-12 LAB — POCT CARDIAC MARKERS: Myoglobin, poc: 59.9

## 2011-07-20 ENCOUNTER — Ambulatory Visit (HOSPITAL_COMMUNITY): Payer: Medicare Other

## 2011-07-31 ENCOUNTER — Encounter (HOSPITAL_COMMUNITY)
Admission: RE | Admit: 2011-07-31 | Discharge: 2011-07-31 | Disposition: A | Discharge status: Home / Self Care | Payer: Medicare Other | Source: Ambulatory Visit | Attending: Endocrinology | Admitting: Endocrinology

## 2011-07-31 ENCOUNTER — Encounter (HOSPITAL_COMMUNITY): Payer: Self-pay

## 2011-07-31 DIAGNOSIS — E0590 Thyrotoxicosis, unspecified without thyrotoxic crisis or storm: Secondary | ICD-10-CM | POA: Insufficient documentation

## 2011-07-31 MED ORDER — SODIUM IODIDE I 131 CAPSULE
50.7000 | Freq: Once | INTRAVENOUS | Status: AC | PRN
  Administered 2011-07-31: 50.7 via ORAL

## 2011-09-10 ENCOUNTER — Other Ambulatory Visit (INDEPENDENT_AMBULATORY_CARE_PROVIDER_SITE_OTHER): Payer: Medicare Other

## 2011-09-10 DIAGNOSIS — E0590 Thyrotoxicosis, unspecified without thyrotoxic crisis or storm: Secondary | ICD-10-CM

## 2011-09-10 LAB — T4, FREE: Free T4: 0.53 ng/dL — ABNORMAL LOW (ref 0.60–1.60)

## 2011-09-11 ENCOUNTER — Encounter: Payer: Self-pay | Admitting: Endocrinology

## 2011-09-11 ENCOUNTER — Ambulatory Visit (INDEPENDENT_AMBULATORY_CARE_PROVIDER_SITE_OTHER): Payer: Medicare Other | Admitting: Endocrinology

## 2011-09-11 VITALS — BP 102/64 | HR 64 | Temp 98.3°F | Ht 61.0 in | Wt 166.0 lb

## 2011-09-11 DIAGNOSIS — E0590 Thyrotoxicosis, unspecified without thyrotoxic crisis or storm: Secondary | ICD-10-CM

## 2011-09-11 NOTE — Progress Notes (Signed)
Subjective:    Patient ID: Vanessa Boyd, female    DOB: October 28, 1940, 70 y.o.   MRN: [id number]  HPI Pt is now 6 weeks s/p her 2nd i-131 rx for hyperthyroidism, due to right-sided grave's dz.  She now has cold intolerance, hair loss, and dry skin.   Past Medical History  Diagnosis Date  . Hypertension   . Palpitations   . Hypoglycemia, unspecified   . Abnormal bone density screening   . Hyperthyroidism     Past Surgical History  Procedure Date  . Appendectomy   . Tonsillectomy   . Abdominal hysterectomy   . Oophorectomy   . Shoulder open rotator cuff repair   . Orif left elbow   . Eye surgery     bilateral cataract surgery    History   Social History  . Marital Status: Widowed    Spouse Name: N/A    Number of Children: 3  . Years of Education: 12   Occupational History  . travel agent     retired   Social History Main Topics  . Smoking status: Former Smoker -- 20 years    Types: Cigarettes    Quit date: 10/22/1998  . Smokeless tobacco: Not on file  . Alcohol Use: 1.0 oz/week    2 drink(s) per week  . Drug Use: No  . Sexually Active: Not Currently   Other Topics Concern  . Not on file   Social History Narrative   Married '62 - widowed '85; 3 sons - '63, '64, '66; 6 grandchildren. HSG. 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 Has a good support network. End- of- Life: OK for CPR, no prolonged ventilator support, no prolong heroic or futile measures.     Current Outpatient Prescriptions on File Prior to Visit  Medication Sig Dispense Refill  . atenolol (TENORMIN) 25 MG tablet 1/2 tab daily       . Calcium Carbonate-Vitamin D (CALTRATE 600+D) 600-400 MG-UNIT per tablet Take 1 tablet by mouth 2 (two) times daily.       . Cholecalciferol (VITAMIN D3) 1000 UNITS CAPS Take 1 capsule by mouth daily.        Marland Kitchen KRILL OIL 1000 MG CAPS Take by mouth.        . metFORMIN (GLUCOPHAGE-XR) 500 MG 24 hr tablet Take 500 mg by mouth  daily.        . Multiple Vitamins-Minerals (CENTRUM SILVER PO) Take 1 tablet by mouth daily.        . Naproxen Sodium (ALEVE) 220 MG CAPS Take by mouth as needed.        . Pseudoephedrine-Naproxen Na (ALEVE COLD & SINUS) 120-220 MG TB12 Take by mouth as needed.        . simvastatin (ZOCOR) 20 MG tablet Take 20 mg by mouth at bedtime.         Current Facility-Administered Medications on File Prior to Visit  Medication Dose Route Frequency Provider Last Rate Last Dose  . zoster vaccine live (PF) (ZOSTAVAX) injection 19,400 Units  0.65 mL Subcutaneous Once Duke Salvia, MD        Allergies  Allergen Reactions  . Codeine     REACTION: nausea/vomiting    Family History  Problem Relation Age of Onset  . COPD Mother   . Hyperlipidemia Mother   . Hypertension Sister   . Hyperlipidemia Sister   . Diabetes Neg Hx   . Heart disease Neg Hx  BP 102/64  Pulse 64  Temp(Src) 98.3 F (36.8 C) (Oral)  Ht 5\' 1"  (1.549 m)  Wt 166 lb (75.297 kg)  BMI 31.37 kg/m2  SpO2 96%  Review of Systems Denies weight change.      Objective:   Physical Exam VITAL SIGNS:  See vs page GENERAL: no distress Thyroid is slightly enlarged on the right, and normal on the left.  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  She has some features of both.

## 2011-09-11 NOTE — Patient Instructions (Addendum)
Please go back to the lab in approx 2 weeks, to recheck the thyroid blood tests.  then  please call [phone] to hear your test results.  You will be prompted to enter the 9-digit "MRN" number that appears at the top left of this page, followed by #.  Then you will hear the message. Please come back for a follow-up appointment in 2 months.  Please make an appointment.

## 2011-10-09 ENCOUNTER — Other Ambulatory Visit (INDEPENDENT_AMBULATORY_CARE_PROVIDER_SITE_OTHER): Payer: Medicare Other

## 2011-10-09 DIAGNOSIS — E0590 Thyrotoxicosis, unspecified without thyrotoxic crisis or storm: Secondary | ICD-10-CM

## 2011-10-09 LAB — TSH: TSH: 1.02 u[IU]/mL (ref 0.35–5.50)

## 2011-10-09 LAB — T4, FREE: Free T4: 0.6 ng/dL (ref 0.60–1.60)

## 2011-10-14 ENCOUNTER — Other Ambulatory Visit: Payer: Self-pay | Admitting: Endocrinology

## 2011-10-30 ENCOUNTER — Telehealth: Payer: Self-pay | Admitting: *Deleted

## 2011-10-30 DIAGNOSIS — E0590 Thyrotoxicosis, unspecified without thyrotoxic crisis or storm: Secondary | ICD-10-CM

## 2011-10-30 NOTE — Telephone Encounter (Signed)
Pt has upcoming appointment on 1/22 for F/U on thyroid and she would like labs orders to be done before appointment.

## 2011-10-30 NOTE — Telephone Encounter (Signed)
Pt informed lab orders placed into system.  

## 2011-10-30 NOTE — Telephone Encounter (Signed)
i ordered

## 2011-11-12 ENCOUNTER — Other Ambulatory Visit (INDEPENDENT_AMBULATORY_CARE_PROVIDER_SITE_OTHER): Payer: Medicare Other

## 2011-11-12 DIAGNOSIS — E0590 Thyrotoxicosis, unspecified without thyrotoxic crisis or storm: Secondary | ICD-10-CM

## 2011-11-12 LAB — T4, FREE: Free T4: 0.7 ng/dL (ref 0.60–1.60)

## 2011-11-12 LAB — TSH: TSH: 0.66 u[IU]/mL (ref 0.35–5.50)

## 2011-11-13 ENCOUNTER — Encounter: Payer: Self-pay | Admitting: Endocrinology

## 2011-11-13 ENCOUNTER — Ambulatory Visit (INDEPENDENT_AMBULATORY_CARE_PROVIDER_SITE_OTHER): Payer: Medicare Other | Admitting: Endocrinology

## 2011-11-13 VITALS — BP 110/72 | HR 72 | Temp 97.0°F | Ht 59.0 in | Wt 165.1 lb

## 2011-11-13 DIAGNOSIS — E0590 Thyrotoxicosis, unspecified without thyrotoxic crisis or storm: Secondary | ICD-10-CM

## 2011-11-13 NOTE — Patient Instructions (Addendum)
Please come back for a follow-up appointment in 2 months.  Please make an appointment.  Please do the blood tests 1-2 days prior.   The is a chance that the overactive thyroid will flare up once again.   (update: i left message on phone-tree:  rx as we discussed)

## 2011-11-13 NOTE — Progress Notes (Signed)
Subjective:    Patient ID: Vanessa Boyd, female    DOB: 24-Apr-1941, 71 y.o.   MRN: [id number]  HPI Pt is now 3 1/2 months s/p her 2nd i-131 rx for hyperthyroidism, due to right-sided grave's dz.  pt states she feels well in general.   Past Medical History  Diagnosis Date  . Hypertension   . Palpitations   . Hypoglycemia, unspecified   . Abnormal bone density screening   . Hyperthyroidism     Past Surgical History  Procedure Date  . Appendectomy   . Tonsillectomy   . Abdominal hysterectomy   . Oophorectomy   . Shoulder open rotator cuff repair   . Orif left elbow   . Eye surgery     bilateral cataract surgery    History   Social History  . Marital Status: Widowed    Spouse Name: N/A    Number of Children: 3  . Years of Education: 12   Occupational History  . travel agent     retired   Social History Main Topics  . Smoking status: Former Smoker -- 20 years    Types: Cigarettes    Quit date: 10/22/1998  . Smokeless tobacco: Not on file  . Alcohol Use: 1.0 oz/week    2 drink(s) per week  . Drug Use: No  . Sexually Active: Not Currently   Other Topics Concern  . Not on file   Social History Narrative   Married '62 - widowed '85; 3 sons - '63, '64, '66; 6 grandchildren. HSG. 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 Has a good support network. End- of- Life: OK for CPR, no prolonged ventilator support, no prolong heroic or futile measures.     Current Outpatient Prescriptions on File Prior to Visit  Medication Sig Dispense Refill  . atenolol (TENORMIN) 25 MG tablet 1/2 tab daily       . Calcium Carbonate-Vitamin D (CALTRATE 600+D) 600-400 MG-UNIT per tablet Take 1 tablet by mouth 2 (two) times daily.       . Cholecalciferol (VITAMIN D3) 1000 UNITS CAPS Take 1 capsule by mouth daily.        Marland Kitchen KRILL OIL 1000 MG CAPS Take by mouth.        . metFORMIN (GLUCOPHAGE-XR) 500 MG 24 hr tablet Take 500 mg by mouth daily.          . Multiple Vitamins-Minerals (CENTRUM SILVER PO) Take 1 tablet by mouth daily.        . Naproxen Sodium (ALEVE) 220 MG CAPS Take by mouth as needed.        . simvastatin (ZOCOR) 20 MG tablet Take 20 mg by mouth at bedtime.        . Pseudoephedrine-Naproxen Na (ALEVE COLD & SINUS) 120-220 MG TB12 Take by mouth as needed.         Current Facility-Administered Medications on File Prior to Visit  Medication Dose Route Frequency Provider Last Rate Last Dose  . zoster vaccine live (PF) (ZOSTAVAX) injection 19,400 Units  0.65 mL Subcutaneous Once Duke Salvia, MD        Allergies  Allergen Reactions  . Codeine     REACTION: nausea/vomiting    Family History  Problem Relation Age of Onset  . COPD Mother   . Hyperlipidemia Mother   . Hypertension Sister   . Hyperlipidemia Sister   . Diabetes Neg Hx   . Heart disease Neg Hx  BP 110/72  Pulse 72  Temp(Src) 97 F (36.1 C) (Oral)  Ht 4\' 11"  (1.499 m)  Wt 165 lb 1.3 oz (74.88 kg)  BMI 33.34 kg/m2  SpO2 98%   Review of Systems Denies weight change.      Objective:   Physical Exam VITAL SIGNS:  See vs page GENERAL: no distress Thyroid is slightly enlarged on the right, and normal on the left.    Lab Results  Component Value Date   TSH 0.66 11/12/2011      Assessment & Plan:  Hyperthyroidism, resolved with i-131 rx

## 2012-01-08 ENCOUNTER — Other Ambulatory Visit: Payer: Medicare Other

## 2012-01-08 ENCOUNTER — Other Ambulatory Visit (INDEPENDENT_AMBULATORY_CARE_PROVIDER_SITE_OTHER): Payer: Medicare Other

## 2012-01-08 DIAGNOSIS — E0590 Thyrotoxicosis, unspecified without thyrotoxic crisis or storm: Secondary | ICD-10-CM

## 2012-01-10 ENCOUNTER — Encounter: Payer: Self-pay | Admitting: Endocrinology

## 2012-01-10 ENCOUNTER — Ambulatory Visit (INDEPENDENT_AMBULATORY_CARE_PROVIDER_SITE_OTHER): Payer: Medicare Other | Admitting: Endocrinology

## 2012-01-10 VITALS — BP 128/82 | HR 74 | Temp 98.2°F | Ht 60.0 in | Wt 166.0 lb

## 2012-01-10 DIAGNOSIS — E0590 Thyrotoxicosis, unspecified without thyrotoxic crisis or storm: Secondary | ICD-10-CM

## 2012-01-10 NOTE — Progress Notes (Signed)
Subjective:    Patient ID: Vanessa Boyd, female    DOB: 30-Nov-1940, 71 y.o.   MRN: [id number]  HPI Pt is now 5 1/2 months s/p her 2nd i-131 rx for hyperthyroidism, due to right-sided grave's dz.  pt states she feels well in general.  Past Medical History  Diagnosis Date  . Hypertension   . Palpitations   . Hypoglycemia, unspecified   . Abnormal bone density screening   . Hyperthyroidism     Past Surgical History  Procedure Date  . Appendectomy   . Tonsillectomy   . Abdominal hysterectomy   . Oophorectomy   . Shoulder open rotator cuff repair   . Orif left elbow   . Eye surgery     bilateral cataract surgery    History   Social History  . Marital Status: Widowed    Spouse Name: N/A    Number of Children: 3  . Years of Education: 12   Occupational History  . travel agent     retired   Social History Main Topics  . Smoking status: Former Smoker -- 20 years    Types: Cigarettes    Quit date: 10/22/1998  . Smokeless tobacco: Not on file  . Alcohol Use: 1.0 oz/week    2 drink(s) per week  . Drug Use: No  . Sexually Active: Not Currently   Other Topics Concern  . Not on file   Social History Narrative   Married '62 - widowed '85; 3 sons - '63, '64, '66; 6 grandchildren. HSG. 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 Has a good support network. End- of- Life: OK for CPR, no prolonged ventilator support, no prolong heroic or futile measures.     Current Outpatient Prescriptions on File Prior to Visit  Medication Sig Dispense Refill  . atenolol (TENORMIN) 25 MG tablet 1/2 tab daily       . Calcium Carbonate-Vitamin D (CALTRATE 600+D) 600-400 MG-UNIT per tablet Take 1 tablet by mouth 2 (two) times daily.       . Cholecalciferol (VITAMIN D3) 1000 UNITS CAPS Take 1 capsule by mouth daily.        Marland Kitchen KRILL OIL 1000 MG CAPS Take by mouth.        . metFORMIN (GLUCOPHAGE-XR) 500 MG 24 hr tablet Take 500 mg by mouth daily.          . Multiple Vitamins-Minerals (CENTRUM SILVER PO) Take 1 tablet by mouth daily.        . Naproxen Sodium (ALEVE) 220 MG CAPS Take by mouth as needed.        . Pseudoephedrine-Naproxen Na (ALEVE COLD & SINUS) 120-220 MG TB12 Take by mouth as needed.        . simvastatin (ZOCOR) 20 MG tablet Take 20 mg by mouth at bedtime.         Current Facility-Administered Medications on File Prior to Visit  Medication Dose Route Frequency Provider Last Rate Last Dose  . zoster vaccine live (PF) (ZOSTAVAX) injection 19,400 Units  0.65 mL Subcutaneous Once Jacques Navy, MD        Allergies  Allergen Reactions  . Codeine     REACTION: nausea/vomiting    Family History  Problem Relation Age of Onset  . COPD Mother   . Hyperlipidemia Mother   . Hypertension Sister   . Hyperlipidemia Sister   . Diabetes Neg Hx   . Heart disease Neg Hx  BP 128/82  Pulse 74  Temp(Src) 98.2 F (36.8 C) (Oral)  Ht 5' (1.524 m)  Wt 166 lb (75.297 kg)  BMI 32.42 kg/m2  SpO2 97%  Review of Systems Denies fever    Objective:   Physical Exam VITAL SIGNS:  See vs page GENERAL: no distress Right thyroid lobe is slightly enlarged.  Left is normal  Lab Results  Component Value Date   TSH 0.75 01/08/2012      Assessment & Plan:  Hyperthyroidism, resolved with i-131 rx.  However, she is at risk for recurrence, given her persistent endogenous thyroid function.

## 2012-01-10 NOTE — Patient Instructions (Addendum)
Please come back for a follow-up appointment in 4 months.  Please do the blood tests 1-2 days prior.   No treatment is needed now.

## 2012-01-14 ENCOUNTER — Other Ambulatory Visit: Payer: Self-pay | Admitting: *Deleted

## 2012-01-14 MED ORDER — ATENOLOL 25 MG PO TABS: ORAL_TABLET | ORAL | Status: DC

## 2012-01-14 NOTE — Telephone Encounter (Signed)
R'cd fax from [facility] for 90 day refill of Atenolol

## 2012-05-12 ENCOUNTER — Other Ambulatory Visit (INDEPENDENT_AMBULATORY_CARE_PROVIDER_SITE_OTHER): Payer: Medicare Other

## 2012-05-12 DIAGNOSIS — E0590 Thyrotoxicosis, unspecified without thyrotoxic crisis or storm: Secondary | ICD-10-CM

## 2012-05-14 ENCOUNTER — Encounter: Payer: Self-pay | Admitting: Endocrinology

## 2012-05-14 ENCOUNTER — Ambulatory Visit (INDEPENDENT_AMBULATORY_CARE_PROVIDER_SITE_OTHER): Payer: Medicare Other | Admitting: Endocrinology

## 2012-05-14 VITALS — BP 110/78 | HR 62 | Temp 98.3°F | Ht 61.0 in | Wt 167.0 lb

## 2012-05-14 DIAGNOSIS — E0590 Thyrotoxicosis, unspecified without thyrotoxic crisis or storm: Secondary | ICD-10-CM

## 2012-05-14 NOTE — Patient Instructions (Addendum)
i'm glad that your thyroid has gone down to normal, and that you need no thyroid medication.  However, this means that your thyroid was slowed down, but not completely destroyed by the radioactive iodine.  This in turn means that the overactive thyroid could flare-up again. Please come back for a follow-up appointment in 6 months

## 2012-05-14 NOTE — Progress Notes (Signed)
Subjective:    Patient ID: Vanessa Boyd, female    DOB: [birth year], 71 y.o.   MRN: [id number]  HPI Pt is now 9 months s/p her 2nd i-131 rx for hyperthyroidism, due to right-sided grave's dz.  pt states she feels well in general, except for intermittent palpitations.   Past Medical History  Diagnosis Date  . Hypertension   . Palpitations   . Hypoglycemia, unspecified   . Abnormal bone density screening   . Hyperthyroidism     Past Surgical History  Procedure Date  . Appendectomy   . Tonsillectomy   . Abdominal hysterectomy   . Oophorectomy   . Shoulder open rotator cuff repair   . Orif left elbow   . Eye surgery     bilateral cataract surgery    History   Social History  . Marital Status: Widowed    Spouse Name: N/A    Number of Children: 3  . Years of Education: 12   Occupational History  . travel agent     retired   Social History Main Topics  . Smoking status: Former Smoker -- 20 years    Types: Cigarettes    Quit date: 10/22/1998  . Smokeless tobacco: Not on file  . Alcohol Use: 1.0 oz/week    2 drink(s) per week  . Drug Use: No  . Sexually Active: Not Currently   Other Topics Concern  . Not on file   Social History Narrative   Married '62 - widowed '85; 3 sons - '63, '64, '66; 6 grandchildren. HSG. 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 Has a good support network. End- of- Life: OK for CPR, no prolonged ventilator support, no prolong heroic or futile measures.     Current Outpatient Prescriptions on File Prior to Visit  Medication Sig Dispense Refill  . atenolol (TENORMIN) 25 MG tablet Take 1/2 tablet by mouth daily  45 tablet  2  . Calcium Carbonate-Vitamin D (CALTRATE 600+D) 600-400 MG-UNIT per tablet Take 1 tablet by mouth 2 (two) times daily.       . Cholecalciferol (VITAMIN D3) 1000 UNITS CAPS Take 1 capsule by mouth daily.        Marland Kitchen KRILL OIL 1000 MG CAPS Take by mouth.        . metFORMIN  (GLUCOPHAGE-XR) 500 MG 24 hr tablet Take 500 mg by mouth daily.        . Multiple Vitamins-Minerals (CENTRUM SILVER PO) Take 1 tablet by mouth daily.        . Naproxen Sodium (ALEVE) 220 MG CAPS Take by mouth as needed.        . Pseudoephedrine-Naproxen Na (ALEVE COLD & SINUS) 120-220 MG TB12 Take by mouth as needed.        . simvastatin (ZOCOR) 20 MG tablet Take 20 mg by mouth at bedtime.         Current Facility-Administered Medications on File Prior to Visit  Medication Dose Route Frequency Provider Last Rate Last Dose  . zoster vaccine live (PF) (ZOSTAVAX) injection 19,400 Units  0.65 mL Subcutaneous Once Jacques Navy, MD        Allergies  Allergen Reactions  . Codeine     REACTION: nausea/vomiting    Family History  Problem Relation Age of Onset  . COPD Mother   . Hyperlipidemia Mother   . Hypertension Sister   . Hyperlipidemia Sister   . Diabetes Neg Hx   .  Heart disease Neg Hx     BP 110/78  Pulse 62  Temp 98.3 F (36.8 C) (Oral)  Ht 5\' 1"  (1.549 m)  Wt 167 lb (75.751 kg)  BMI 31.55 kg/m2  SpO2 98%    Review of Systems Denies fever    Objective:   Physical Exam VITAL SIGNS:  See vs page GENERAL: no distress Right thyroid lobe is slightly enlarged.  Left is normal   Lab Results  Component Value Date   TSH 1.25 05/12/2012      Assessment & Plan:  Hyperthyroidism, resolved with i-131 rx

## 2012-06-18 ENCOUNTER — Encounter: Payer: Medicare Other | Attending: Internal Medicine | Admitting: *Deleted

## 2012-06-18 ENCOUNTER — Encounter: Payer: Self-pay | Admitting: *Deleted

## 2012-06-18 VITALS — Ht 60.0 in | Wt 164.1 lb

## 2012-06-18 DIAGNOSIS — E669 Obesity, unspecified: Secondary | ICD-10-CM | POA: Insufficient documentation

## 2012-06-18 DIAGNOSIS — Z713 Dietary counseling and surveillance: Secondary | ICD-10-CM | POA: Insufficient documentation

## 2012-06-18 NOTE — Progress Notes (Signed)
  Medical Nutrition Therapy:  Appt start time: 1130 end time:  1230.   Assessment:  Primary concerns today: obesity and hyperglycemia  MEDICATIONS: see list   DIETARY INTAKE:  Usual eating pattern includes 3 meals and 2-3 snacks per day.  Everyday foods include starches, lean proteins, fruits, vegetables.  Avoided foods include none.    24-hr recall:  B ( AM): yoplait greek with 2 tbsp granola  Snk ( AM): clementine or 1/2 apple or string cheese  L ( PM): toasted sunflower seed bread and slices at home with sargento ultra thin cheese with ham with light mayo may have pickle Snk ( PM): fruit or string cheese D ( PM): roasted chicken with sliced cucumber with tabule (bulgher wheat with vegetables)  Snk ( PM): 1/2 cup frozen yogurt Beverages: water or diet green tea, crystal light, skim milk  [facility] [facility]  Has a trainer, but doesn't use every time  Estimated energy needs: 1400 calories 158 g carbohydrates 105 g protein 39 g fat  Progress Towards Goal(s):  In progress.   Nutritional Diagnosis:  Iola-3.3 Overweight/obesity As related to larger portions, snacking, and limited physical activity.  As evidenced by BMI of 32.    Intervention:  Nutrition counseling provided.  [place]uma is here for weight loss education.  She has started making changes to her diet and exercise routine and wants validation that she's on the right track.  Since her appointment with doctor on 06/05/12, she's lost 2 pounds.  She has been limiting her portions and limiting her "cheats" (sweets).  She is active 3 days/week.  Her HgA1C has been increasing over the years and today we spent some time discussing carbohydrates and the appropriate portion size.  She didn't know that fruits and milk products, in addition to starches and sweets, affect blood glucose.  Encouraged limited portions of carbohydrates to to continue to limit sweets.  Encouraged lean protein at every meal and to  increase non-starchy vegetables.  Encouraged more whole grains.  Discussed MyPlate recommendations for meal planning.  Encouraged daily walking for increased LBM, preserving bone density, and weight loss.    Handouts given during visit include:  My meal plan card  Monitoring/Evaluation:  Dietary intake, exercise, blood glucose, and body weight prn.  Madason will make follow up, if needed

## 2012-06-18 NOTE — Patient Instructions (Signed)
Goals:  Eat 3 meals/day, Avoid meal skipping   Increase protein rich foods  Follow "Plate Method" for portion control  Limit carbohydrate1-2 servings/meal   Choose more whole grains, lean protein, low-fat dairy, and fruits/non-starchy vegetables.   Aim for >30 min of physical activity daily  Limit sugar-sweetened beverages and concentrated sweets   

## 2012-10-13 ENCOUNTER — Other Ambulatory Visit: Payer: Self-pay | Admitting: Endocrinology

## 2013-03-03 ENCOUNTER — Encounter: Payer: Self-pay | Admitting: *Deleted

## 2013-03-05 ENCOUNTER — Ambulatory Visit: Payer: Self-pay | Admitting: Nurse Practitioner

## 2014-08-23 ENCOUNTER — Encounter: Payer: Self-pay | Admitting: *Deleted

## 2015-04-12 ENCOUNTER — Ambulatory Visit (HOSPITAL_COMMUNITY)
Admission: RE | Admit: 2015-04-12 | Discharge: 2015-04-12 | Disposition: A | Discharge status: Home / Self Care | Payer: Medicare Other | Source: Ambulatory Visit | Attending: Internal Medicine | Admitting: Internal Medicine

## 2015-04-12 ENCOUNTER — Encounter (HOSPITAL_COMMUNITY): Payer: Self-pay

## 2015-04-12 ENCOUNTER — Other Ambulatory Visit (HOSPITAL_COMMUNITY): Payer: Self-pay | Admitting: Internal Medicine

## 2015-04-12 DIAGNOSIS — M810 Age-related osteoporosis without current pathological fracture: Secondary | ICD-10-CM | POA: Insufficient documentation

## 2015-04-12 MED ORDER — DENOSUMAB 60 MG/ML ~~LOC~~ SOLN
60.0000 mg | Freq: Once | SUBCUTANEOUS | Status: AC
  Administered 2015-04-12: 60 mg via SUBCUTANEOUS
  Filled 2015-04-12: qty 1

## 2015-04-12 NOTE — Discharge Instructions (Signed)
IF YOU ARE GOING TO BE 15 OR MINUTES LATE FOR YOUR APPOINTMENT, PLEASE CALL [phone] TO MAKE OTHER ARRANGEMENTS FOR YOUR TREATMENT ° °IF YOU ARRIVE EARLY FOR YOUR SCHEDULED APPOINTMENT , YOU MAY HAVE TO WAIT UNTIL YOUR SCHEDULED TIME.Denosumab injection °What is this medicine? °DENOSUMAB (den oh sue mab) slows bone breakdown. Prolia is used to treat osteoporosis in women after menopause and in men. Xgeva is used to prevent bone fractures and other bone problems caused by cancer bone metastases. Xgeva is also used to treat giant cell tumor of the bone. °This medicine may be used for other purposes; ask your health care provider or pharmacist if you have questions. °COMMON BRAND NAME(S): Prolia, XGEVA °What should I tell my health care provider before I take this medicine? °They need to know if you have any of these conditions: °-dental disease °-eczema °-infection or history of infections °-kidney disease or on dialysis °-low blood calcium or vitamin D °-malabsorption syndrome °-scheduled to have surgery or tooth extraction °-taking medicine that contains denosumab °-thyroid or parathyroid disease °-an unusual reaction to denosumab, other medicines, foods, dyes, or preservatives °-pregnant or trying to get pregnant °-breast-feeding °How should I use this medicine? °This medicine is for injection under the skin. It is given by a health care professional in a hospital or clinic setting. °If you are getting Prolia, a special MedGuide will be given to you by the pharmacist with each prescription and refill. Be sure to read this information carefully each time. °For Prolia, talk to your pediatrician regarding the use of this medicine in children. Special care may be needed. For Xgeva, talk to your pediatrician regarding the use of this medicine in children. While this drug may be prescribed for children as young as 13 years for selected conditions, precautions do apply. °Overdosage: If you think you've taken too much of  this medicine contact a poison control center or emergency room at once. °Overdosage: If you think you have taken too much of this medicine contact a poison control center or emergency room at once. °NOTE: This medicine is only for you. Do not share this medicine with others. °What if I miss a dose? °It is important not to miss your dose. Call your doctor or health care professional if you are unable to keep an appointment. °What may interact with this medicine? °Do not take this medicine with any of the following medications: °-other medicines containing denosumab °This medicine may also interact with the following medications: °-medicines that suppress the immune system °-medicines that treat cancer °-steroid medicines like prednisone or cortisone °This list may not describe all possible interactions. Give your health care provider a list of all the medicines, herbs, non-prescription drugs, or dietary supplements you use. Also tell them if you smoke, drink alcohol, or use illegal drugs. Some items may interact with your medicine. °What should I watch for while using this medicine? °Visit your doctor or health care professional for regular checks on your progress. Your doctor or health care professional may order blood tests and other tests to see how you are doing. °Call your doctor or health care professional if you get a cold or other infection while receiving this medicine. Do not treat yourself. This medicine may decrease your body's ability to fight infection. °You should make sure you get enough calcium and vitamin D while you are taking this medicine, unless your doctor tells you not to. Discuss the foods you eat and the vitamins you take with your health care   professional. °See your dentist regularly. Brush and floss your teeth as directed. Before you have any dental work done, tell your dentist you are receiving this medicine. °Do not become pregnant while taking this medicine or for 5 months after  stopping it. Women should inform their doctor if they wish to become pregnant or think they might be pregnant. There is a potential for serious side effects to an unborn child. Talk to your health care professional or pharmacist for more information. °What side effects may I notice from receiving this medicine? °Side effects that you should report to your doctor or health care professional as soon as possible: °-allergic reactions like skin rash, itching or hives, swelling of the face, lips, or tongue °-breathing problems °-chest pain °-fast, irregular heartbeat °-feeling faint or lightheaded, falls °-fever, chills, or any other sign of infection °-muscle spasms, tightening, or twitches °-numbness or tingling °-skin blisters or bumps, or is dry, peels, or red °-slow healing or unexplained pain in the mouth or jaw °-unusual bleeding or bruising °Side effects that usually do not require medical attention (Report these to your doctor or health care professional if they continue or are bothersome.): °-muscle pain °-stomach upset, gas °This list may not describe all possible side effects. Call your doctor for medical advice about side effects. You may report side effects to FDA at 1-800-FDA-1088. °Where should I keep my medicine? °This medicine is only given in a clinic, doctor's office, or other health care setting and will not be stored at home. °NOTE: This sheet is a summary. It may not cover all possible information. If you have questions about this medicine, talk to your doctor, pharmacist, or health care provider. °© 2015, [employer]. (2012-04-07 12:37:47) ° °

## 2015-10-12 ENCOUNTER — Ambulatory Visit (HOSPITAL_COMMUNITY)
Admission: RE | Admit: 2015-10-12 | Discharge: 2015-10-12 | Disposition: A | Discharge status: Home / Self Care | Payer: Medicare Other | Source: Ambulatory Visit | Attending: Internal Medicine | Admitting: Internal Medicine

## 2015-10-23 DIAGNOSIS — M4800 Spinal stenosis, site unspecified: Secondary | ICD-10-CM

## 2015-10-23 HISTORY — DX: Spinal stenosis, site unspecified: M48.00

## 2015-10-26 ENCOUNTER — Encounter: Payer: Self-pay | Admitting: Internal Medicine

## 2015-11-14 ENCOUNTER — Encounter (HOSPITAL_COMMUNITY): Payer: Self-pay

## 2015-11-14 ENCOUNTER — Encounter (HOSPITAL_COMMUNITY)
Admission: RE | Admit: 2015-11-14 | Discharge: 2015-11-14 | Disposition: A | Discharge status: Home / Self Care | Payer: Medicare Other | Source: Ambulatory Visit | Attending: Internal Medicine | Admitting: Internal Medicine

## 2015-11-14 DIAGNOSIS — M810 Age-related osteoporosis without current pathological fracture: Secondary | ICD-10-CM | POA: Diagnosis not present

## 2015-11-14 MED ORDER — DENOSUMAB 60 MG/ML ~~LOC~~ SOLN
60.0000 mg | Freq: Once | SUBCUTANEOUS | Status: AC
  Administered 2015-11-14: 60 mg via SUBCUTANEOUS
  Filled 2015-11-14: qty 1

## 2015-11-14 NOTE — Discharge Instructions (Signed)
PROLIA °Denosumab injection °What is this medicine? °DENOSUMAB (den oh sue mab) slows bone breakdown. Prolia is used to treat osteoporosis in women after menopause and in men. Xgeva is used to prevent bone fractures and other bone problems caused by cancer bone metastases. Xgeva is also used to treat giant cell tumor of the bone. °This medicine may be used for other purposes; ask your health care provider or pharmacist if you have questions. °What should I tell my health care provider before I take this medicine? °They need to know if you have any of these conditions: °-dental disease °-eczema °-infection or history of infections °-kidney disease or on dialysis °-low blood calcium or vitamin D °-malabsorption syndrome °-scheduled to have surgery or tooth extraction °-taking medicine that contains denosumab °-thyroid or parathyroid disease °-an unusual reaction to denosumab, other medicines, foods, dyes, or preservatives °-pregnant or trying to get pregnant °-breast-feeding °How should I use this medicine? °This medicine is for injection under the skin. It is given by a health care professional in a hospital or clinic setting. °If you are getting Prolia, a special MedGuide will be given to you by the pharmacist with each prescription and refill. Be sure to read this information carefully each time. °For Prolia, talk to your pediatrician regarding the use of this medicine in children. Special care may be needed. For Xgeva, talk to your pediatrician regarding the use of this medicine in children. While this drug may be prescribed for children as young as 13 years for selected conditions, precautions do apply. °Overdosage: If you think you have taken too much of this medicine contact a poison control center or emergency room at once. °NOTE: This medicine is only for you. Do not share this medicine with others. °What if I miss a dose? °It is important not to miss your dose. Call your doctor or health care professional if  you are unable to keep an appointment. °What may interact with this medicine? °Do not take this medicine with any of the following medications: °-other medicines containing denosumab °This medicine may also interact with the following medications: °-medicines that suppress the immune system °-medicines that treat cancer °-steroid medicines like prednisone or cortisone °This list may not describe all possible interactions. Give your health care provider a list of all the medicines, herbs, non-prescription drugs, or dietary supplements you use. Also tell them if you smoke, drink alcohol, or use illegal drugs. Some items may interact with your medicine. °What should I watch for while using this medicine? °Visit your doctor or health care professional for regular checks on your progress. Your doctor or health care professional may order blood tests and other tests to see how you are doing. °Call your doctor or health care professional if you get a cold or other infection while receiving this medicine. Do not treat yourself. This medicine may decrease your body's ability to fight infection. °You should make sure you get enough calcium and vitamin D while you are taking this medicine, unless your doctor tells you not to. Discuss the foods you eat and the vitamins you take with your health care professional. °See your dentist regularly. Brush and floss your teeth as directed. Before you have any dental work done, tell your dentist you are receiving this medicine. °Do not become pregnant while taking this medicine or for 5 months after stopping it. Women should inform their doctor if they wish to become pregnant or think they might be pregnant. There is a potential for serious side   effects to an unborn child. Talk to your health care professional or pharmacist for more information. °What side effects may I notice from receiving this medicine? °Side effects that you should report to your doctor or health care professional as  soon as possible: °-allergic reactions like skin rash, itching or hives, swelling of the face, lips, or tongue °-breathing problems °-chest pain °-fast, irregular heartbeat °-feeling faint or lightheaded, falls °-fever, chills, or any other sign of infection °-muscle spasms, tightening, or twitches °-numbness or tingling °-skin blisters or bumps, or is dry, peels, or red °-slow healing or unexplained pain in the mouth or jaw °-unusual bleeding or bruising °Side effects that usually do not require medical attention (Report these to your doctor or health care professional if they continue or are bothersome.): °-muscle pain °-stomach upset, gas °This list may not describe all possible side effects. Call your doctor for medical advice about side effects. You may report side effects to FDA at 1-800-FDA-1088. °Where should I keep my medicine? °This medicine is only given in a clinic, doctor's office, or other health care setting and will not be stored at home. °NOTE: This sheet is a summary. It may not cover all possible information. If you have questions about this medicine, talk to your doctor, pharmacist, or health care provider. °  °© 2016, [employer]. (2012-04-07 12:37:47) °Osteoporosis °Osteoporosis is the thinning and loss of density in the bones. Osteoporosis makes the bones more brittle, fragile, and likely to break (fracture). Over time, osteoporosis can cause the bones to become so weak that they fracture after a simple fall. The bones most likely to fracture are the bones in the hip, wrist, and spine. °CAUSES  °The exact cause is not known. °RISK FACTORS °Anyone can develop osteoporosis. You may be at greater risk if you have a family history of the condition or have poor nutrition. You may also have a higher risk if you are:  °· Female.   °· 50 years old or older. °· A smoker. °· Not physically active.   °· White or Asian. °· Slender. °SIGNS AND SYMPTOMS  °A fracture might be the first sign of the  disease, especially if it results from a fall or injury that would not usually cause a bone to break. Other signs and symptoms include:  °· Low back and neck pain. °· Stooped posture. °· Height loss. °DIAGNOSIS  °To make a diagnosis, your health care provider may: °· Take a medical history. °· Perform a physical exam. °· Order tests, such as: °¨ A bone mineral density test. °¨ A dual-energy X-ray absorptiometry test. °TREATMENT  °The goal of osteoporosis treatment is to strengthen your bones to reduce your risk of a fracture. Treatment may involve: °· Making lifestyle changes, such as: °¨ Eating a diet rich in calcium. °¨ Doing weight-bearing and muscle-strengthening exercises. °¨ Stopping tobacco use. °¨ Limiting alcohol intake. °· Taking medicine to slow the process of bone loss or to increase bone density. °· Monitoring your levels of calcium and vitamin D. °HOME CARE INSTRUCTIONS °· Include calcium and vitamin D in your diet. Calcium is important for bone health, and vitamin D helps the body absorb calcium. °· Perform weight-bearing and muscle-strengthening exercises as directed by your health care provider. °· Do not use any tobacco products, including cigarettes, chewing tobacco, and electronic cigarettes. If you need help quitting, ask your health care provider. °· Limit your alcohol intake. °· Take medicines only as directed by your health care provider. °· Keep all   follow-up visits as directed by your health care provider. This is important. °· Take precautions at home to lower your risk of falling, such as: °¨ Keeping rooms well lit and clutter free. °¨ Installing safety rails on stairs. °¨ Using rubber mats in the bathroom and other areas that are often wet or slippery. °SEEK IMMEDIATE MEDICAL CARE IF:  °You fall or injure yourself.  °  °This information is not intended to replace advice given to you by your health care provider. Make sure you discuss any questions you have with your health care  provider. °  °Document Released: 07/18/2005 Document Revised: 10/29/2014 Document Reviewed: 03/18/2014 °Elsevier Interactive Patient Education ©2016 [employer] ° ° °

## 2016-01-30 ENCOUNTER — Ambulatory Visit (INDEPENDENT_AMBULATORY_CARE_PROVIDER_SITE_OTHER): Payer: Medicare Other | Admitting: Internal Medicine

## 2016-05-15 ENCOUNTER — Encounter (HOSPITAL_COMMUNITY): Payer: Self-pay

## 2016-05-15 ENCOUNTER — Ambulatory Visit (HOSPITAL_COMMUNITY)
Admission: RE | Admit: 2016-05-15 | Discharge: 2016-05-15 | Disposition: A | Discharge status: Home / Self Care | Payer: Medicare Other | Source: Ambulatory Visit | Attending: Internal Medicine | Admitting: Internal Medicine

## 2016-05-15 ENCOUNTER — Encounter (INDEPENDENT_AMBULATORY_CARE_PROVIDER_SITE_OTHER): Payer: Self-pay

## 2016-05-15 DIAGNOSIS — M810 Age-related osteoporosis without current pathological fracture: Secondary | ICD-10-CM | POA: Insufficient documentation

## 2016-05-15 HISTORY — DX: Spinal stenosis, site unspecified: M48.00

## 2016-05-15 MED ORDER — DENOSUMAB 60 MG/ML ~~LOC~~ SOLN
60.0000 mg | Freq: Once | SUBCUTANEOUS | Status: AC
  Administered 2016-05-15: 60 mg via SUBCUTANEOUS
  Filled 2016-05-15: qty 1

## 2016-05-15 NOTE — Discharge Instructions (Signed)
Prolia °Denosumab injection °What is this medicine? °DENOSUMAB (den oh sue mab) slows bone breakdown. Prolia is used to treat osteoporosis in women after menopause and in men. Xgeva is used to prevent bone fractures and other bone problems caused by cancer bone metastases. Xgeva is also used to treat giant cell tumor of the bone. °This medicine may be used for other purposes; ask your health care provider or pharmacist if you have questions. °What should I tell my health care provider before I take this medicine? °They need to know if you have any of these conditions: °-dental disease °-eczema °-infection or history of infections °-kidney disease or on dialysis °-low blood calcium or vitamin D °-malabsorption syndrome °-scheduled to have surgery or tooth extraction °-taking medicine that contains denosumab °-thyroid or parathyroid disease °-an unusual reaction to denosumab, other medicines, foods, dyes, or preservatives °-pregnant or trying to get pregnant °-breast-feeding °How should I use this medicine? °This medicine is for injection under the skin. It is given by a health care professional in a hospital or clinic setting. °If you are getting Prolia, a special MedGuide will be given to you by the pharmacist with each prescription and refill. Be sure to read this information carefully each time. °For Prolia, talk to your pediatrician regarding the use of this medicine in children. Special care may be needed. For Xgeva, talk to your pediatrician regarding the use of this medicine in children. While this drug may be prescribed for children as young as 13 years for selected conditions, precautions do apply. °Overdosage: If you think you have taken too much of this medicine contact a poison control center or emergency room at once. °NOTE: This medicine is only for you. Do not share this medicine with others. °What if I miss a dose? °It is important not to miss your dose. Call your doctor or health care professional  if you are unable to keep an appointment. °What may interact with this medicine? °Do not take this medicine with any of the following medications: °-other medicines containing denosumab °This medicine may also interact with the following medications: °-medicines that suppress the immune system °-medicines that treat cancer °-steroid medicines like prednisone or cortisone °This list may not describe all possible interactions. Give your health care provider a list of all the medicines, herbs, non-prescription drugs, or dietary supplements you use. Also tell them if you smoke, drink alcohol, or use illegal drugs. Some items may interact with your medicine. °What should I watch for while using this medicine? °Visit your doctor or health care professional for regular checks on your progress. Your doctor or health care professional may order blood tests and other tests to see how you are doing. °Call your doctor or health care professional if you get a cold or other infection while receiving this medicine. Do not treat yourself. This medicine may decrease your body's ability to fight infection. °You should make sure you get enough calcium and vitamin D while you are taking this medicine, unless your doctor tells you not to. Discuss the foods you eat and the vitamins you take with your health care professional. °See your dentist regularly. Brush and floss your teeth as directed. Before you have any dental work done, tell your dentist you are receiving this medicine. °Do not become pregnant while taking this medicine or for 5 months after stopping it. Women should inform their doctor if they wish to become pregnant or think they might be pregnant. There is a potential for serious side   effects to an unborn child. Talk to your health care professional or pharmacist for more information. °What side effects may I notice from receiving this medicine? °Side effects that you should report to your doctor or health care professional  as soon as possible: °-allergic reactions like skin rash, itching or hives, swelling of the face, lips, or tongue °-breathing problems °-chest pain °-fast, irregular heartbeat °-feeling faint or lightheaded, falls °-fever, chills, or any other sign of infection °-muscle spasms, tightening, or twitches °-numbness or tingling °-skin blisters or bumps, or is dry, peels, or red °-slow healing or unexplained pain in the mouth or jaw °-unusual bleeding or bruising °Side effects that usually do not require medical attention (Report these to your doctor or health care professional if they continue or are bothersome.): °-muscle pain °-stomach upset, gas °This list may not describe all possible side effects. Call your doctor for medical advice about side effects. You may report side effects to FDA at 1-800-FDA-1088. °Where should I keep my medicine? °This medicine is only given in a clinic, doctor's office, or other health care setting and will not be stored at home. °NOTE: This sheet is a summary. It may not cover all possible information. If you have questions about this medicine, talk to your doctor, pharmacist, or health care provider. °  °© 2016, [employer]. (2012-04-07 12:37:47) ° °

## 2016-10-17 ENCOUNTER — Emergency Department (HOSPITAL_COMMUNITY): Payer: Medicare Other

## 2016-10-17 ENCOUNTER — Emergency Department (HOSPITAL_COMMUNITY)
Admission: EM | Admit: 2016-10-17 | Discharge: 2016-10-17 | Disposition: A | Discharge status: Home / Self Care | Payer: Medicare Other | Attending: Emergency Medicine | Admitting: Emergency Medicine

## 2016-10-17 ENCOUNTER — Encounter (HOSPITAL_COMMUNITY): Payer: Self-pay

## 2016-10-17 DIAGNOSIS — R51 Headache: Secondary | ICD-10-CM | POA: Insufficient documentation

## 2016-10-17 DIAGNOSIS — Y999 Unspecified external cause status: Secondary | ICD-10-CM | POA: Insufficient documentation

## 2016-10-17 DIAGNOSIS — I10 Essential (primary) hypertension: Secondary | ICD-10-CM | POA: Insufficient documentation

## 2016-10-17 DIAGNOSIS — Y939 Activity, unspecified: Secondary | ICD-10-CM | POA: Insufficient documentation

## 2016-10-17 DIAGNOSIS — S199XXA Unspecified injury of neck, initial encounter: Secondary | ICD-10-CM | POA: Diagnosis present

## 2016-10-17 DIAGNOSIS — Y929 Unspecified place or not applicable: Secondary | ICD-10-CM | POA: Diagnosis not present

## 2016-10-17 DIAGNOSIS — Z79899 Other long term (current) drug therapy: Secondary | ICD-10-CM | POA: Insufficient documentation

## 2016-10-17 DIAGNOSIS — Z87891 Personal history of nicotine dependence: Secondary | ICD-10-CM | POA: Insufficient documentation

## 2016-10-17 DIAGNOSIS — R52 Pain, unspecified: Secondary | ICD-10-CM

## 2016-10-17 DIAGNOSIS — S12101A Unspecified nondisplaced fracture of second cervical vertebra, initial encounter for closed fracture: Secondary | ICD-10-CM | POA: Diagnosis not present

## 2016-10-17 DIAGNOSIS — W0110XA Fall on same level from slipping, tripping and stumbling with subsequent striking against unspecified object, initial encounter: Secondary | ICD-10-CM | POA: Diagnosis not present

## 2016-10-17 DIAGNOSIS — W010XXA Fall on same level from slipping, tripping and stumbling without subsequent striking against object, initial encounter: Secondary | ICD-10-CM

## 2016-10-17 LAB — I-STAT CHEM 8, ED
BUN: 12 mg/dL (ref 6–20)
CALCIUM ION: 1.2 mmol/L (ref 1.15–1.40)
Chloride: 103 mmol/L (ref 101–111)
Creatinine, Ser: 0.7 mg/dL (ref 0.44–1.00)
GLUCOSE: 110 mg/dL — AB (ref 65–99)
HCT: 37 % (ref 36.0–46.0)
HEMOGLOBIN: 12.6 g/dL (ref 12.0–15.0)
Potassium: 4.3 mmol/L (ref 3.5–5.1)
SODIUM: 139 mmol/L (ref 135–145)
TCO2: 26 mmol/L (ref 0–100)

## 2016-10-17 MED ORDER — IOPAMIDOL (ISOVUE-370) INJECTION 76%
100.0000 mL | Freq: Once | INTRAVENOUS | Status: AC | PRN
  Administered 2016-10-17: 100 mL via INTRAVENOUS

## 2016-10-17 MED ORDER — IOPAMIDOL (ISOVUE-370) INJECTION 76%
INTRAVENOUS | Status: AC
  Filled 2016-10-17: qty 100

## 2016-10-17 MED ORDER — TRAMADOL HCL 50 MG PO TABS: 50.0000 mg | ORAL_TABLET | Freq: Four times a day (QID) | ORAL | 0 refills | Status: DC | PRN

## 2016-10-17 MED ORDER — OXYCODONE-ACETAMINOPHEN 5-325 MG PO TABS
1.0000 | ORAL_TABLET | ORAL | Status: AC | PRN
Start: 2016-10-17 — End: 2016-10-17
  Administered 2016-10-17 (×2): 1 via ORAL
  Filled 2016-10-17 (×2): qty 1

## 2016-10-17 NOTE — ED Triage Notes (Signed)
Patient states she hit her head on a cabinet door received a laceration on her head and then fell backwards hitting the back of her head on the floor. Today, patient is still having pain in the back of her head and right posterior neck pain. MAE, No numbness or tingling of extremities. Patient denies blurred vision or N/V.

## 2016-10-17 NOTE — Discharge Instructions (Signed)
It was our pleasure to provide your ER care today - we hope that you feel better.  Wear cervical collar at all times until evaluated by neurosurgeon in their office - see referral - call office tomorrow AM to arrange appointment time.   Take motrin or aleve as need for pain.  You may also take ultram as need for pain - no driving when taking.  Your ct scan made incidental note of 'a 2.9 cm RIGHT thyroid nodule' - the radiologist recommends an outpatient thyroid ultrasound in the next couple weeks - contact your primary care doctors office tomorrow, discuss this result with them, and have them arrange the outpatient imaging study.  Return to ER if worse, new symptoms, numbness/weakness, other concern.

## 2016-10-17 NOTE — ED Provider Notes (Addendum)
[facility]-EMERGENCY DEPT Provider Note   CSN: [id number] Arrival date & time: 10/17/16  1131     History   Chief Complaint Chief Complaint  Patient presents with  . Fall  . Head Injury  . Neck Pain    HPI Vanessa Boyd is a 75 y.o. female.  Patient c/o recent fall and head injury, with persistent pain to back of head and neck. Fall was approximately 6 days ago. Stood up, hit cabinet with top of head, then fell back and hit back of head on floor. Notes transient loc. Since fall, persistent pain to back or head and upper neck. Pain constant, dull, non radiating, moderate. No nausea or vomiting. Denies other pain or injury. No numbness/weakness, or change in normal functional ability.    The history is provided by the patient.  Fall  Associated symptoms include headaches. Pertinent negatives include no chest pain, no abdominal pain and no shortness of breath.  Head Injury   Pertinent negatives include no numbness, no vomiting and no weakness.  Neck Pain   Associated symptoms include headaches. Pertinent negatives include no chest pain, no numbness and no weakness.    Past Medical History:  Diagnosis Date  . Abnormal bone density screening   . Hypertension   . Hyperthyroidism   . Hypoglycemia, unspecified   . IBS (irritable bowel syndrome)   . Obesity   . Osteoporosis   . Palpitations   . Post-menopausal   . Spinal stenosis 2017    Patient Active Problem List   Diagnosis Date Noted  . HYPERTHYROIDISM 12/13/2010  . HYPOGLYCEMIA, UNSPECIFIED 12/13/2010  . UNSPECIFIED ESSENTIAL HYPERTENSION 12/13/2010  . OSTEOPOROSIS 12/13/2010  . PALPITATIONS 12/13/2010    Past Surgical History:  Procedure Laterality Date  . ABDOMINAL HYSTERECTOMY    . APPENDECTOMY    . EYE SURGERY     bilateral cataract surgery  . OOPHORECTOMY    . orif left elbow    . SHOULDER OPEN ROTATOR CUFF REPAIR    . TONSILLECTOMY      OB History    Gravida Para Term Preterm AB Living   3 3       3      SAB TAB Ectopic Multiple Live Births                   Home Medications    Prior to Admission medications   Medication Sig Start Date End Date Taking? Authorizing Provider  Ascorbic Acid (VITAMIN C PO) Take 1 tablet by mouth daily.   Yes Historical Provider, MD  Calcium Carbonate-Vitamin D (CALTRATE 600+D) 600-400 MG-UNIT per tablet Take 1 tablet by mouth 2 (two) times daily.    Yes Historical Provider, MD  Cholecalciferol (VITAMIN D3) 1000 UNITS CAPS Take 1 capsule by mouth daily.     Yes Historical Provider, MD  denosumab (PROLIA) 60 MG/ML SOLN injection Inject 60 mg into the skin every 6 (six) months. Administer in upper arm, thigh, or abdomen   Yes Historical Provider, MD  gabapentin (NEURONTIN) 300 MG capsule Take 300 mg by mouth 2 (two) times daily as needed (back pain).  07/10/16  Yes Historical Provider, MD  KRILL OIL 1000 MG CAPS Take by mouth.     Yes Historical Provider, MD  metFORMIN (GLUCOPHAGE-XR) 500 MG 24 hr tablet Take 500 mg by mouth daily.     Yes Historical Provider, MD  Multiple Vitamins-Minerals (CENTRUM SILVER PO) Take 1 tablet by mouth daily.     Yes Historical Provider, MD  Naproxen Sodium (ALEVE) 220 MG CAPS Take 440 mg by mouth daily as needed (pain,headache).    Yes Historical Provider, MD  simvastatin (ZOCOR) 40 MG tablet Take 40 mg by mouth daily. 10/06/16  Yes Historical Provider, MD  atenolol (TENORMIN) 25 MG tablet TAKE 1/2 TABLET BY MOUTH ONCE DAILY Patient not taking: Reported on 10/17/2016 10/13/12   Jacques NavyMichael E Norins, MD    Family History Family History  Problem Relation Age of Onset  . COPD Mother   . Hyperlipidemia Mother   . Hypertension Sister   . Hyperlipidemia Sister   . Diabetes Neg Hx   . Heart disease Neg Hx     Social History Social History  Substance Use Topics  . Smoking status: Former Smoker    Years: 20.00    Types: Cigarettes    Quit date: 10/22/1998  . Smokeless tobacco: Never Used  . Alcohol use 1.0 oz/week    2 Standard  drinks or equivalent per week     Comment: occasionally     Allergies   Codeine   Review of Systems Review of Systems  Constitutional: Negative for chills and fever.  HENT: Negative for sore throat.   Eyes: Negative for visual disturbance.  Respiratory: Negative for shortness of breath.   Cardiovascular: Negative for chest pain.  Gastrointestinal: Negative for abdominal pain and vomiting.  Genitourinary: Negative for flank pain.  Musculoskeletal: Positive for neck pain. Negative for back pain.  Skin: Negative for rash.  Neurological: Positive for headaches. Negative for weakness and numbness.  Hematological: Does not bruise/bleed easily.  Psychiatric/Behavioral: Negative for confusion.     Physical Exam Updated Vital Signs BP 180/94 (BP Location: Right Arm) Comment: informed nurse of b/p  Pulse 97   Temp 98.4 F (36.9 C) (Oral)   Resp 20   SpO2 100%   Physical Exam  Constitutional: She is oriented to person, place, and time. She appears well-developed and well-nourished. No distress.  HENT:  Tenderness posterior scalp.   Eyes: Conjunctivae are normal. Pupils are equal, round, and reactive to light. No scleral icterus.  Neck: Neck supple. No tracheal deviation present.  Cardiovascular: Normal rate, regular rhythm, normal heart sounds and intact distal pulses.   Pulmonary/Chest: Effort normal and breath sounds normal. No respiratory distress.  Abdominal: Soft. Normal appearance and bowel sounds are normal. She exhibits no distension. There is no tenderness.  Musculoskeletal: She exhibits no edema.  Upper cervical tenderness, otherwise, CTLS spine, non tender, aligned, no step off.   Neurological: She is alert and oriented to person, place, and time.  Speech clear/fluent. Motor intact bil, stre 5/5. sens grossly intact. Ambulates w steady gait.   Skin: Skin is warm and dry. No rash noted. She is not diaphoretic.  Psychiatric: She has a normal mood and affect.  Nursing  note and vitals reviewed.    ED Treatments / Results  Labs (all labs ordered are listed, but only abnormal results are displayed) Labs Reviewed - No data to display  EKG  EKG Interpretation None       Radiology No results found.  Procedures Procedures (including critical care time)  Medications Ordered in ED Medications  oxyCODONE-acetaminophen (PERCOCET/ROXICET) 5-325 MG per tablet 1 tablet (1 tablet Oral Given 10/17/16 1439)     Initial Impression / Assessment and Plan / ED Course  I have reviewed the triage vital signs and the nursing notes.  Pertinent labs & imaging results that were available during my care of the patient were reviewed by  me and considered in my medical decision making (see chart for details).  Clinical Course    Cervical collar.  CT's ordered.   Reviewed nursing notes and prior charts for additional history.   Tylenol po.   Neurosurgery consulted.    Final Clinical Impressions(s) / ED Diagnoses   Final diagnoses:  None    New Prescriptions New Prescriptions   No medications on file         Cathren Laine, MD 10/17/16 [phone]

## 2016-10-17 NOTE — ED Notes (Signed)
Pt in ct 

## 2016-10-17 NOTE — ED Notes (Signed)
She is in no distress and ambulates to b.r. And back without difficulty. She is aware we are awaiting CT.

## 2016-11-16 ENCOUNTER — Ambulatory Visit (HOSPITAL_COMMUNITY)
Admission: RE | Admit: 2016-11-16 | Discharge: 2016-11-16 | Disposition: A | Discharge status: Home / Self Care | Payer: Medicare Other | Source: Ambulatory Visit | Attending: Internal Medicine | Admitting: Internal Medicine

## 2016-11-16 ENCOUNTER — Encounter (HOSPITAL_COMMUNITY): Payer: Self-pay

## 2016-11-16 DIAGNOSIS — M810 Age-related osteoporosis without current pathological fracture: Secondary | ICD-10-CM | POA: Insufficient documentation

## 2016-11-16 MED ORDER — DENOSUMAB 60 MG/ML ~~LOC~~ SOLN
60.0000 mg | Freq: Once | SUBCUTANEOUS | Status: AC
  Administered 2016-11-16: 60 mg via SUBCUTANEOUS
  Filled 2016-11-16: qty 1

## 2016-11-16 NOTE — Discharge Instructions (Signed)
°Prolia  ° °Denosumab injection °What is this medicine? °DENOSUMAB (den oh sue mab) slows bone breakdown. Prolia is used to treat osteoporosis in women after menopause and in men. Xgeva is used to prevent bone fractures and other bone problems caused by cancer bone metastases. Xgeva is also used to treat giant cell tumor of the bone. °COMMON BRAND NAME(S): Prolia, XGEVA °What should I tell my health care provider before I take this medicine? °They need to know if you have any of these conditions: °-dental disease °-eczema °-infection or history of infections °-kidney disease or on dialysis °-low blood calcium or vitamin D °-malabsorption syndrome °-scheduled to have surgery or tooth extraction °-taking medicine that contains denosumab °-thyroid or parathyroid disease °-an unusual reaction to denosumab, other medicines, foods, dyes, or preservatives °-pregnant or trying to get pregnant °-breast-feeding °How should I use this medicine? °This medicine is for injection under the skin. It is given by a health care professional in a hospital or clinic setting. °If you are getting Prolia, a special MedGuide will be given to you by the pharmacist with each prescription and refill. Be sure to read this information carefully each time. °For Prolia, talk to your pediatrician regarding the use of this medicine in children. Special care may be needed. For Xgeva, talk to your pediatrician regarding the use of this medicine in children. While this drug may be prescribed for children as young as 13 years for selected conditions, precautions do apply. °What if I miss a dose? °It is important not to miss your dose. Call your doctor or health care professional if you are unable to keep an appointment. °What may interact with this medicine? °Do not take this medicine with any of the following medications: °-other medicines containing denosumab °This medicine may also interact with the following medications: °-medicines that suppress  the immune system °-medicines that treat cancer °-steroid medicines like prednisone or cortisone °What should I watch for while using this medicine? °Visit your doctor or health care professional for regular checks on your progress. Your doctor or health care professional may order blood tests and other tests to see how you are doing. °Call your doctor or health care professional if you get a cold or other infection while receiving this medicine. Do not treat yourself. This medicine may decrease your body's ability to fight infection. °You should make sure you get enough calcium and vitamin D while you are taking this medicine, unless your doctor tells you not to. Discuss the foods you eat and the vitamins you take with your health care professional. °See your dentist regularly. Brush and floss your teeth as directed. Before you have any dental work done, tell your dentist you are receiving this medicine. °Do not become pregnant while taking this medicine or for 5 months after stopping it. Women should inform their doctor if they wish to become pregnant or think they might be pregnant. There is a potential for serious side effects to an unborn child. Talk to your health care professional or pharmacist for more information. °What side effects may I notice from receiving this medicine? °Side effects that you should report to your doctor or health care professional as soon as possible: °-allergic reactions like skin rash, itching or hives, swelling of the face, lips, or tongue °-breathing problems °-chest pain °-fast, irregular heartbeat °-feeling faint or lightheaded, falls °-fever, chills, or any other sign of infection °-muscle spasms, tightening, or twitches °-numbness or tingling °-skin blisters or bumps, or is dry, peels,   or red °-slow healing or unexplained pain in the mouth or jaw °-unusual bleeding or bruising °Side effects that usually do not require medical attention (report to your doctor or health care  professional if they continue or are bothersome): °-muscle pain °-stomach upset, gas °Where should I keep my medicine? °This medicine is only given in a clinic, doctor's office, or other health care setting and will not be stored at home. °© 2017 [employer] (2015-11-10 10:06:55) ° °

## 2016-12-28 ENCOUNTER — Other Ambulatory Visit: Payer: Self-pay | Admitting: Internal Medicine

## 2016-12-28 DIAGNOSIS — E041 Nontoxic single thyroid nodule: Secondary | ICD-10-CM

## 2017-02-11 ENCOUNTER — Other Ambulatory Visit: Payer: Medicare Other

## 2017-02-21 ENCOUNTER — Ambulatory Visit
Admission: RE | Admit: 2017-02-21 | Discharge: 2017-02-21 | Disposition: A | Discharge status: Home / Self Care | Payer: Medicare Other | Source: Ambulatory Visit | Attending: Internal Medicine | Admitting: Internal Medicine

## 2017-02-21 DIAGNOSIS — E041 Nontoxic single thyroid nodule: Secondary | ICD-10-CM

## 2017-02-26 ENCOUNTER — Other Ambulatory Visit: Payer: Self-pay | Admitting: Internal Medicine

## 2017-02-26 DIAGNOSIS — E041 Nontoxic single thyroid nodule: Secondary | ICD-10-CM

## 2017-03-06 ENCOUNTER — Other Ambulatory Visit (HOSPITAL_COMMUNITY)
Admission: RE | Admit: 2017-03-06 | Discharge: 2017-03-06 | Disposition: A | Discharge status: Home / Self Care | Payer: Medicare Other | Source: Ambulatory Visit | Attending: Radiology | Admitting: Radiology

## 2017-03-06 ENCOUNTER — Ambulatory Visit
Admission: RE | Admit: 2017-03-06 | Discharge: 2017-03-06 | Disposition: A | Discharge status: Home / Self Care | Payer: Medicare Other | Source: Ambulatory Visit | Attending: Internal Medicine | Admitting: Internal Medicine

## 2017-03-06 DIAGNOSIS — E041 Nontoxic single thyroid nodule: Secondary | ICD-10-CM | POA: Diagnosis present

## 2017-05-17 ENCOUNTER — Ambulatory Visit (HOSPITAL_COMMUNITY)
Admission: RE | Admit: 2017-05-17 | Discharge: 2017-05-17 | Disposition: A | Discharge status: Home / Self Care | Payer: Medicare Other | Source: Ambulatory Visit | Attending: Internal Medicine | Admitting: Internal Medicine

## 2017-05-17 ENCOUNTER — Encounter (HOSPITAL_COMMUNITY): Payer: Self-pay

## 2017-05-17 DIAGNOSIS — M810 Age-related osteoporosis without current pathological fracture: Secondary | ICD-10-CM | POA: Diagnosis not present

## 2017-05-17 MED ORDER — DENOSUMAB 60 MG/ML ~~LOC~~ SOLN
60.0000 mg | Freq: Once | SUBCUTANEOUS | Status: AC
  Administered 2017-05-17: 60 mg via SUBCUTANEOUS
  Filled 2017-05-17: qty 1

## 2017-05-17 NOTE — Discharge Instructions (Signed)
Denosumab injection / Prolia What is this medicine? DENOSUMAB (den oh sue mab) slows bone breakdown. Prolia is used to treat osteoporosis in women after menopause and in men. Delton See is used to treat a high calcium level due to cancer and to prevent bone fractures and other bone problems caused by multiple myeloma or cancer bone metastases. Delton See is also used to treat giant cell tumor of the bone. This medicine may be used for other purposes; ask your health care provider or pharmacist if you have questions. COMMON BRAND NAME(S): Prolia, XGEVA What should I tell my health care provider before I take this medicine? They need to know if you have any of these conditions: -dental disease -having surgery or tooth extraction -infection -kidney disease -low levels of calcium or Vitamin D in the blood -malnutrition -on hemodialysis -skin conditions or sensitivity -thyroid or parathyroid disease -an unusual reaction to denosumab, other medicines, foods, dyes, or preservatives -pregnant or trying to get pregnant -breast-feeding How should I use this medicine? This medicine is for injection under the skin. It is given by a health care professional in a hospital or clinic setting. If you are getting Prolia, a special MedGuide will be given to you by the pharmacist with each prescription and refill. Be sure to read this information carefully each time. For Prolia, talk to your pediatrician regarding the use of this medicine in children. Special care may be needed. For Delton See, talk to your pediatrician regarding the use of this medicine in children. While this drug may be prescribed for children as young as 13 years for selected conditions, precautions do apply. Overdosage: If you think you have taken too much of this medicine contact a poison control center or emergency room at once. NOTE: This medicine is only for you. Do not share this medicine with others. What if I miss a dose? It is important not to  miss your dose. Call your doctor or health care professional if you are unable to keep an appointment. What may interact with this medicine? Do not take this medicine with any of the following medications: -other medicines containing denosumab This medicine may also interact with the following medications: -medicines that lower your chance of fighting infection -steroid medicines like prednisone or cortisone This list may not describe all possible interactions. Give your health care provider a list of all the medicines, herbs, non-prescription drugs, or dietary supplements you use. Also tell them if you smoke, drink alcohol, or use illegal drugs. Some items may interact with your medicine. What should I watch for while using this medicine? Visit your doctor or health care professional for regular checks on your progress. Your doctor or health care professional may order blood tests and other tests to see how you are doing. Call your doctor or health care professional for advice if you get a fever, chills or sore throat, or other symptoms of a cold or flu. Do not treat yourself. This drug may decrease your body's ability to fight infection. Try to avoid being around people who are sick. You should make sure you get enough calcium and vitamin D while you are taking this medicine, unless your doctor tells you not to. Discuss the foods you eat and the vitamins you take with your health care professional. See your dentist regularly. Brush and floss your teeth as directed. Before you have any dental work done, tell your dentist you are receiving this medicine. Do not become pregnant while taking this medicine or for 5 months  after stopping it. Talk with your doctor or health care professional about your birth control options while taking this medicine. Women should inform their doctor if they wish to become pregnant or think they might be pregnant. There is a potential for serious side effects to an unborn  child. Talk to your health care professional or pharmacist for more information. What side effects may I notice from receiving this medicine? Side effects that you should report to your doctor or health care professional as soon as possible: -allergic reactions like skin rash, itching or hives, swelling of the face, lips, or tongue -bone pain -breathing problems -dizziness -jaw pain, especially after dental work -redness, blistering, peeling of the skin -signs and symptoms of infection like fever or chills; cough; sore throat; pain or trouble passing urine -signs of low calcium like fast heartbeat, muscle cramps or muscle pain; pain, tingling, numbness in the hands or feet; seizures -unusual bleeding or bruising -unusually weak or tired Side effects that usually do not require medical attention (report to your doctor or health care professional if they continue or are bothersome): -constipation -diarrhea -headache -joint pain -loss of appetite -muscle pain -runny nose -tiredness -upset stomach This list may not describe all possible side effects. Call your doctor for medical advice about side effects. You may report side effects to FDA at 1-800-FDA-1088. Where should I keep my medicine? This medicine is only given in a clinic, doctor's office, or other health care setting and will not be stored at home. NOTE: This sheet is a summary. It may not cover all possible information. If you have questions about this medicine, talk to your doctor, pharmacist, or health care provider.  2018 [employer] (2016-10-30 19:17:21)

## 2017-11-18 ENCOUNTER — Ambulatory Visit (HOSPITAL_COMMUNITY)
Admission: RE | Admit: 2017-11-18 | Discharge: 2017-11-18 | Disposition: A | Discharge status: Home / Self Care | Payer: Medicare Other | Source: Ambulatory Visit | Attending: Internal Medicine | Admitting: Internal Medicine

## 2017-11-18 ENCOUNTER — Encounter (HOSPITAL_COMMUNITY): Payer: Self-pay

## 2017-11-18 DIAGNOSIS — M810 Age-related osteoporosis without current pathological fracture: Secondary | ICD-10-CM | POA: Insufficient documentation

## 2017-11-18 MED ORDER — DENOSUMAB 60 MG/ML ~~LOC~~ SOLN
60.0000 mg | Freq: Once | SUBCUTANEOUS | Status: AC
  Administered 2017-11-18: 60 mg via SUBCUTANEOUS
  Filled 2017-11-18: qty 1

## 2017-11-18 NOTE — Discharge Instructions (Signed)
Denosumab injection / Prolia What is this medicine? DENOSUMAB (den oh sue mab) slows bone breakdown. Prolia is used to treat osteoporosis in women after menopause and in men. Delton See is used to treat a high calcium level due to cancer and to prevent bone fractures and other bone problems caused by multiple myeloma or cancer bone metastases. Delton See is also used to treat giant cell tumor of the bone. This medicine may be used for other purposes; ask your health care provider or pharmacist if you have questions. COMMON BRAND NAME(S): Prolia, XGEVA What should I tell my health care provider before I take this medicine? They need to know if you have any of these conditions: -dental disease -having surgery or tooth extraction -infection -kidney disease -low levels of calcium or Vitamin D in the blood -malnutrition -on hemodialysis -skin conditions or sensitivity -thyroid or parathyroid disease -an unusual reaction to denosumab, other medicines, foods, dyes, or preservatives -pregnant or trying to get pregnant -breast-feeding How should I use this medicine? This medicine is for injection under the skin. It is given by a health care professional in a hospital or clinic setting. If you are getting Prolia, a special MedGuide will be given to you by the pharmacist with each prescription and refill. Be sure to read this information carefully each time. For Prolia, talk to your pediatrician regarding the use of this medicine in children. Special care may be needed. For Delton See, talk to your pediatrician regarding the use of this medicine in children. While this drug may be prescribed for children as young as 13 years for selected conditions, precautions do apply. Overdosage: If you think you have taken too much of this medicine contact a poison control center or emergency room at once. NOTE: This medicine is only for you. Do not share this medicine with others. What if I miss a dose? It is important not to  miss your dose. Call your doctor or health care professional if you are unable to keep an appointment. What may interact with this medicine? Do not take this medicine with any of the following medications: -other medicines containing denosumab This medicine may also interact with the following medications: -medicines that lower your chance of fighting infection -steroid medicines like prednisone or cortisone This list may not describe all possible interactions. Give your health care provider a list of all the medicines, herbs, non-prescription drugs, or dietary supplements you use. Also tell them if you smoke, drink alcohol, or use illegal drugs. Some items may interact with your medicine. What should I watch for while using this medicine? Visit your doctor or health care professional for regular checks on your progress. Your doctor or health care professional may order blood tests and other tests to see how you are doing. Call your doctor or health care professional for advice if you get a fever, chills or sore throat, or other symptoms of a cold or flu. Do not treat yourself. This drug may decrease your body's ability to fight infection. Try to avoid being around people who are sick. You should make sure you get enough calcium and vitamin D while you are taking this medicine, unless your doctor tells you not to. Discuss the foods you eat and the vitamins you take with your health care professional. See your dentist regularly. Brush and floss your teeth as directed. Before you have any dental work done, tell your dentist you are receiving this medicine. Do not become pregnant while taking this medicine or for 5 months  after stopping it. Talk with your doctor or health care professional about your birth control options while taking this medicine. Women should inform their doctor if they wish to become pregnant or think they might be pregnant. There is a potential for serious side effects to an unborn  child. Talk to your health care professional or pharmacist for more information. What side effects may I notice from receiving this medicine? Side effects that you should report to your doctor or health care professional as soon as possible: -allergic reactions like skin rash, itching or hives, swelling of the face, lips, or tongue -bone pain -breathing problems -dizziness -jaw pain, especially after dental work -redness, blistering, peeling of the skin -signs and symptoms of infection like fever or chills; cough; sore throat; pain or trouble passing urine -signs of low calcium like fast heartbeat, muscle cramps or muscle pain; pain, tingling, numbness in the hands or feet; seizures -unusual bleeding or bruising -unusually weak or tired Side effects that usually do not require medical attention (report to your doctor or health care professional if they continue or are bothersome): -constipation -diarrhea -headache -joint pain -loss of appetite -muscle pain -runny nose -tiredness -upset stomach This list may not describe all possible side effects. Call your doctor for medical advice about side effects. You may report side effects to FDA at 1-800-FDA-1088. Where should I keep my medicine? This medicine is only given in a clinic, doctor's office, or other health care setting and will not be stored at home. NOTE: This sheet is a summary. It may not cover all possible information. If you have questions about this medicine, talk to your doctor, pharmacist, or health care provider.  2018 [employer] (2016-10-30 19:17:21)

## 2018-02-26 ENCOUNTER — Encounter: Payer: Medicare Other | Admitting: Obstetrics and Gynecology

## 2018-05-20 ENCOUNTER — Ambulatory Visit (HOSPITAL_COMMUNITY): Payer: Medicare Other

## 2018-05-22 ENCOUNTER — Ambulatory Visit (HOSPITAL_COMMUNITY)
Admission: RE | Admit: 2018-05-22 | Discharge: 2018-05-22 | Disposition: A | Discharge status: Home / Self Care | Payer: Medicare Other | Source: Ambulatory Visit | Attending: Internal Medicine | Admitting: Internal Medicine

## 2018-05-22 ENCOUNTER — Encounter (HOSPITAL_COMMUNITY): Payer: Self-pay

## 2018-05-22 DIAGNOSIS — M810 Age-related osteoporosis without current pathological fracture: Secondary | ICD-10-CM | POA: Insufficient documentation

## 2018-05-22 MED ORDER — DENOSUMAB 60 MG/ML ~~LOC~~ SOSY
60.0000 mg | PREFILLED_SYRINGE | Freq: Once | SUBCUTANEOUS | Status: AC
  Administered 2018-05-22: 60 mg via SUBCUTANEOUS
  Filled 2018-05-22: qty 1

## 2018-05-22 NOTE — Discharge Instructions (Signed)
Denosumab injection °What is this medicine? °DENOSUMAB (den oh sue mab) slows bone breakdown. Prolia is used to treat osteoporosis in women after menopause and in men. Xgeva is used to treat a high calcium level due to cancer and to prevent bone fractures and other bone problems caused by multiple myeloma or cancer bone metastases. Xgeva is also used to treat giant cell tumor of the bone. °This medicine may be used for other purposes; ask your health care provider or pharmacist if you have questions. °COMMON BRAND NAME(S): Prolia, XGEVA °What should I tell my health care provider before I take this medicine? °They need to know if you have any of these conditions: °-dental disease °-having surgery or tooth extraction °-infection °-kidney disease °-low levels of calcium or Vitamin D in the blood °-malnutrition °-on hemodialysis °-skin conditions or sensitivity °-thyroid or parathyroid disease °-an unusual reaction to denosumab, other medicines, foods, dyes, or preservatives °-pregnant or trying to get pregnant °-breast-feeding °How should I use this medicine? °This medicine is for injection under the skin. It is given by a health care professional in a hospital or clinic setting. °If you are getting Prolia, a special MedGuide will be given to you by the pharmacist with each prescription and refill. Be sure to read this information carefully each time. °For Prolia, talk to your pediatrician regarding the use of this medicine in children. Special care may be needed. For Xgeva, talk to your pediatrician regarding the use of this medicine in children. While this drug may be prescribed for children as young as 13 years for selected conditions, precautions do apply. °Overdosage: If you think you have taken too much of this medicine contact a poison control center or emergency room at once. °NOTE: This medicine is only for you. Do not share this medicine with others. °What if I miss a dose? °It is important not to miss your  dose. Call your doctor or health care professional if you are unable to keep an appointment. °What may interact with this medicine? °Do not take this medicine with any of the following medications: °-other medicines containing denosumab °This medicine may also interact with the following medications: °-medicines that lower your chance of fighting infection °-steroid medicines like prednisone or cortisone °This list may not describe all possible interactions. Give your health care provider a list of all the medicines, herbs, non-prescription drugs, or dietary supplements you use. Also tell them if you smoke, drink alcohol, or use illegal drugs. Some items may interact with your medicine. °What should I watch for while using this medicine? °Visit your doctor or health care professional for regular checks on your progress. Your doctor or health care professional may order blood tests and other tests to see how you are doing. °Call your doctor or health care professional for advice if you get a fever, chills or sore throat, or other symptoms of a cold or flu. Do not treat yourself. This drug may decrease your body's ability to fight infection. Try to avoid being around people who are sick. °You should make sure you get enough calcium and vitamin D while you are taking this medicine, unless your doctor tells you not to. Discuss the foods you eat and the vitamins you take with your health care professional. °See your dentist regularly. Brush and floss your teeth as directed. Before you have any dental work done, tell your dentist you are receiving this medicine. °Do not become pregnant while taking this medicine or for 5 months after stopping   it. Talk with your doctor or health care professional about your birth control options while taking this medicine. Women should inform their doctor if they wish to become pregnant or think they might be pregnant. There is a potential for serious side effects to an unborn child. Talk  to your health care professional or pharmacist for more information. What side effects may I notice from receiving this medicine? Side effects that you should report to your doctor or health care professional as soon as possible: -allergic reactions like skin rash, itching or hives, swelling of the face, lips, or tongue -bone pain -breathing problems -dizziness -jaw pain, especially after dental work -redness, blistering, peeling of the skin -signs and symptoms of infection like fever or chills; cough; sore throat; pain or trouble passing urine -signs of low calcium like fast heartbeat, muscle cramps or muscle pain; pain, tingling, numbness in the hands or feet; seizures -unusual bleeding or bruising -unusually weak or tired Side effects that usually do not require medical attention (report to your doctor or health care professional if they continue or are bothersome): -constipation -diarrhea -headache -joint pain -loss of appetite -muscle pain -runny nose -tiredness -upset stomach This list may not describe all possible side effects. Call your doctor for medical advice about side effects. You may report side effects to FDA at 1-800-FDA-1088. Where should I keep my medicine? This medicine is only given in a clinic, doctor's office, or other health care setting and will not be stored at home. NOTE: This sheet is a summary. It may not cover all possible information. If you have questions about this medicine, talk to your doctor, pharmacist, or health care provider.  2018 Elsevier/Gold Standard (2016-10-30 19:17:21)

## 2018-09-24 ENCOUNTER — Encounter: Payer: Self-pay | Admitting: Gastroenterology

## 2018-09-24 ENCOUNTER — Ambulatory Visit: Payer: Medicare Other | Admitting: Gastroenterology

## 2018-09-24 VITALS — BP 140/70 | HR 76 | Ht 59.0 in | Wt 143.6 lb

## 2018-09-24 DIAGNOSIS — R195 Other fecal abnormalities: Secondary | ICD-10-CM

## 2018-09-24 MED ORDER — NA SULFATE-K SULFATE-MG SULF 17.5-3.13-1.6 GM/177ML PO SOLN: ORAL | 0 refills | Status: DC

## 2018-09-24 NOTE — Patient Instructions (Signed)
You have been scheduled for a colonoscopy. Please follow written instructions given to you at your visit today.  Please pick up your prep supplies at the pharmacy within the next 1-3 days. If you use inhalers (even only as needed), please bring them with you on the day of your procedure.   If you are age 75 or older, your body mass index should be between 23-30. Your Body mass index is 29 kg/m. If this is out of the aforementioned range listed, please consider follow up with your Primary Care Provider.  If you are age 75 or younger, your body mass index should be between 19-25. Your Body mass index is 29 kg/m. If this is out of the aformentioned range listed, please consider follow up with your Primary Care Provider.    Thank you for choosing [facility]  Philbert RiserKavitha Nandigam,MD

## 2018-09-24 NOTE — Progress Notes (Signed)
Vanessa Boyd    [id number]    15-Jun-1941  Primary Care Physician:Paterson, Quillian Quince, MD  Referring Physician: Leanna Battles, MD [address] [place], [place] [address]  Chief complaint:  Positive heme ocult  HPI: 77 year old female with history of osteoporosis, hypothyroidism here for evaluation of heme positive stool.  She has occasional issue with protrusion of hemorrhoids and swelling otherwise denies any overt rectal bleeding or dark stool.  No heartburn, dysphagia, odynophagia, nausea, vomiting, abdominal pain or weight loss. She takes naproxen  220 mg almost daily for nerve pain radiating down her leg  Colonoscopy December 22, 2009 by Dr. Olevia Perches showed moderate diverticulosis throughout the colon otherwise normal exam   Outpatient Encounter Medications as of 09/24/2018  Medication Sig  . Calcium Carbonate-Vitamin D (CALTRATE 600+D) 600-400 MG-UNIT per tablet Take 1 tablet by mouth 2 (two) times daily.   . Cholecalciferol (VITAMIN D3) 1000 UNITS CAPS Take 1 capsule by mouth daily.    Marland Kitchen denosumab (PROLIA) 60 MG/ML SOLN injection Inject 60 mg into the skin every 6 (six) months. Administer in upper arm, thigh, or abdomen  . KRILL OIL 1000 MG CAPS Take by mouth.    . metFORMIN (GLUCOPHAGE-XR) 500 MG 24 hr tablet Take 500 mg by mouth daily.    . Multiple Vitamins-Minerals (CENTRUM SILVER PO) Take 1 tablet by mouth daily.    . Naproxen Sodium (ALEVE) 220 MG CAPS Take 440 mg by mouth daily as needed (pain,headache).   . simvastatin (ZOCOR) 40 MG tablet Take 40 mg by mouth daily.  Marland Kitchen Zoster Vac Recomb Adjuvanted (SHINGRIX IM) Inject into the muscle. Done Dec 2018  . Na Sulfate-K Sulfate-Mg Sulf 17.5-3.13-1.6 GM/177ML SOLN 1 suprep kit  . [DISCONTINUED] Ascorbic Acid (VITAMIN C PO) Take 1 tablet by mouth daily.  . [DISCONTINUED] atenolol (TENORMIN) 25 MG tablet TAKE 1/2 TABLET BY MOUTH ONCE DAILY (Patient not taking: Reported on 10/17/2016)  . [DISCONTINUED] gabapentin  (NEURONTIN) 300 MG capsule Take 300 mg by mouth 2 (two) times daily as needed (back pain).   . [DISCONTINUED] traMADol (ULTRAM) 50 MG tablet Take 1 tablet (50 mg total) by mouth every 6 (six) hours as needed.   Facility-Administered Encounter Medications as of 09/24/2018  Medication  . zoster vaccine live (PF) (ZOSTAVAX) injection 19,400 Units    Allergies as of 09/24/2018 - Review Complete 09/24/2018  Allergen Reaction Noted  . Codeine  12/08/2009    Past Medical History:  Diagnosis Date  . Abnormal bone density screening   . Diverticulitis   . Hypertension   . Hyperthyroidism   . Hypoglycemia, unspecified   . IBS (irritable bowel syndrome)   . Obesity   . Osteoporosis   . Palpitations   . Post-menopausal   . Spinal stenosis 2017    Past Surgical History:  Procedure Laterality Date  . ABDOMINAL HYSTERECTOMY    . APPENDECTOMY    . EYE SURGERY     bilateral cataract surgery  . OOPHORECTOMY    . orif left elbow    . SHOULDER OPEN ROTATOR CUFF REPAIR    . TONSILLECTOMY      Family History  Problem Relation Age of Onset  . COPD Mother   . Hyperlipidemia Mother   . Hypertension Sister   . Hyperlipidemia Sister   . Diabetes Neg Hx   . Heart disease Neg Hx     Social History   Socioeconomic History  . Marital status: Widowed  Spouse name: Not on file  . Number of children: 3  . Years of education: 4  . Highest education level: Not on file  Occupational History  . 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 Financial resource strain: Not on file  . Food insecurity:    Worry: Not on file    Inability: Not on file  . Transportation needs:    Medical: Not on file    Non-medical: Not on file  Tobacco Use  . Smoking status: Former Smoker    Years: 20.00    Types: Cigarettes    Last attempt to quit: 10/22/1998    Years since quitting: 19.9  . Smokeless tobacco: Never Used  Substance and Sexual Activity  . Alcohol use: Yes     Alcohol/week: 2.0 standard drinks    Types: 2 Standard drinks or equivalent per week    Comment: occasionally  . Drug use: No  . Sexual activity: Not Currently  Lifestyle  . Physical activity:    Days per week: Not on file    Minutes per session: Not on file  . Stress: Not on file  Relationships  . Social connections:    Talks on phone: Not on file    Gets together: Not on file    Attends religious service: Not on file    Active member of club or organization: Not on file    Attends meetings of clubs or organizations: Not on file    Relationship status: Not on file  . Intimate partner violence:    Fear of current or ex partner: Not on file    Emotionally abused: Not on file    Physically abused: Not on file    Forced sexual activity: Not on file  Other Topics Concern  . Not on file  Social History Narrative   Married '62 - widowed '85; 3 sons - '63, '64, '66; 6 grandchildren. [employer]. Worked as a Garment/textile technologist. Lives alone. No pets. Hobbies - walking, gym, needle point. Has a good support network. End- of- Life: OK for CPR, no prolonged ventilator support, no prolong heroic or futile measures.       Review of systems: Review of Systems  Constitutional: Negative for fever and chills.  HENT: Negative.   Eyes: Negative for blurred vision.  Respiratory: Negative for cough, shortness of breath and wheezing.   Cardiovascular: Negative for chest pain and palpitations.  Gastrointestinal: as per HPI Genitourinary: Negative for dysuria, urgency, frequency and hematuria.  Positive for urinary incontinence Musculoskeletal: Negative for myalgias, back pain and joint pain.  Skin: Negative for itching and rash.  Neurological: Negative for dizziness, tremors, focal weakness, seizures and loss of consciousness.  Endo/Heme/Allergies: Negative for seasonal allergies.  Psychiatric/Behavioral: Negative for depression, suicidal ideas and hallucinations.  All other systems reviewed and are  negative.   Physical Exam: Vitals:   09/24/18 1001  BP: 140/70  Pulse: 76   Body mass index is 29 kg/m. Gen:      No acute distress HEENT:  EOMI, sclera anicteric Neck:     No masses; no thyromegaly Lungs:    Clear to auscultation bilaterally; normal respiratory effort CV:         Regular rate and rhythm; no murmurs Abd:      + bowel sounds; soft, non-tender; no palpable masses, no distension Ext:    No edema; adequate peripheral perfusion Skin:      Warm and dry; no rash Neuro: alert  and oriented x 3 Psych: normal mood and affect  Data Reviewed:  Reviewed labs, radiology imaging, old records and pertinent past GI work up   Assessment and Plan/Recommendations:  77 year old female here for evaluation of positive fecal Hemoccult  History of frequent NSAID use Advised patient to avoid using NSAIDs regularly, okay to use Tylenol up to 2 g daily in divided doses as needed Colonoscopy almost 9 years ago Will schedule for colonoscopy to exclude any neoplastic lesion and also EGD to exclude severe gastritis or NSAID induced gastric ulcers to further evaluate positive fecal Hemoccult The risks and benefits as well as alternatives of endoscopic procedure(s) have been discussed and reviewed. All questions answered. The patient agrees to proceed.     Damaris Hippo , MD [phone]    CC: Leanna Battles, MD

## 2018-10-20 ENCOUNTER — Encounter: Payer: Self-pay | Admitting: Gastroenterology

## 2018-10-31 ENCOUNTER — Encounter: Payer: Medicare Other | Admitting: Gastroenterology

## 2018-12-01 ENCOUNTER — Telehealth: Payer: Self-pay | Admitting: Gastroenterology

## 2018-12-01 ENCOUNTER — Other Ambulatory Visit: Payer: Self-pay

## 2018-12-01 NOTE — Telephone Encounter (Signed)
Patient is sick. Cancelled and rescheduled her procedure.

## 2018-12-01 NOTE — Telephone Encounter (Signed)
Pt was put on antibiotics per her obgyn, initially she was put on Valacyclodir but gave her side effects and was switched to  Acyclodirhas. She stater to take yesterday and has to take it for 10 days, She would like to speak with you before making a decision to cancel procedure on 2/12. Pls call her.

## 2018-12-03 ENCOUNTER — Encounter: Payer: Medicare Other | Admitting: Gastroenterology

## 2018-12-19 ENCOUNTER — Ambulatory Visit (HOSPITAL_COMMUNITY): Payer: Medicare Other

## 2018-12-19 ENCOUNTER — Other Ambulatory Visit: Payer: Self-pay | Admitting: *Deleted

## 2018-12-23 ENCOUNTER — Ambulatory Visit (AMBULATORY_SURGERY_CENTER): Payer: Medicare Other | Admitting: Internal Medicine

## 2018-12-23 ENCOUNTER — Other Ambulatory Visit: Payer: Self-pay

## 2018-12-23 ENCOUNTER — Encounter: Payer: Self-pay | Admitting: Internal Medicine

## 2018-12-23 VITALS — BP 123/73 | HR 91 | Temp 98.2°F | Resp 13 | Ht 59.0 in | Wt 143.0 lb

## 2018-12-23 DIAGNOSIS — K449 Diaphragmatic hernia without obstruction or gangrene: Secondary | ICD-10-CM

## 2018-12-23 DIAGNOSIS — R195 Other fecal abnormalities: Secondary | ICD-10-CM | POA: Diagnosis not present

## 2018-12-23 DIAGNOSIS — K3189 Other diseases of stomach and duodenum: Secondary | ICD-10-CM | POA: Diagnosis not present

## 2018-12-23 DIAGNOSIS — K5730 Diverticulosis of large intestine without perforation or abscess without bleeding: Secondary | ICD-10-CM | POA: Diagnosis not present

## 2018-12-23 DIAGNOSIS — K2970 Gastritis, unspecified, without bleeding: Secondary | ICD-10-CM

## 2018-12-23 MED ORDER — OMEPRAZOLE 20 MG PO CPDR: 20.0000 mg | DELAYED_RELEASE_CAPSULE | Freq: Every day | ORAL | 3 refills | Status: DC

## 2018-12-23 MED ORDER — SODIUM CHLORIDE 0.9 % IV SOLN: 500.0000 mL | Freq: Once | INTRAVENOUS | Status: DC

## 2018-12-23 NOTE — Patient Instructions (Addendum)
There is erosive gastritis and a hiatal hernia. Colon shows diverticulosis. Am starting acid blocking medicine and checked for infection in stomach.  Naproxen likely causing some/all of this.  I appreciate the opportunity to care for you. Iva Boop, MD, FACG YOU HAD AN ENDOSCOPIC PROCEDURE TODAY AT THE [facility]:   Refer to the procedure report that was given to you for any specific questions about what was found during the examination.  If the procedure report does not answer your questions, please call your gastroenterologist to clarify.  If you requested that your care partner not be given the details of your procedure findings, then the procedure report has been included in a sealed envelope for you to review at your convenience later.  YOU SHOULD EXPECT: Some feelings of bloating in the abdomen. Passage of more gas than usual.  Walking can help get rid of the air that was put into your GI tract during the procedure and reduce the bloating. If you had a lower endoscopy (such as a colonoscopy or flexible sigmoidoscopy) you may notice spotting of blood in your stool or on the toilet paper. If you underwent a bowel prep for your procedure, you may not have a normal bowel movement for a few days.  Please Note:  You might notice some irritation and congestion in your nose or some drainage.  This is from the oxygen used during your procedure.  There is no need for concern and it should clear up in a day or so.  SYMPTOMS TO REPORT IMMEDIATELY:   Following lower endoscopy (colonoscopy or flexible sigmoidoscopy):  Excessive amounts of blood in the stool  Significant tenderness or worsening of abdominal pains  Swelling of the abdomen that is new, acute  Fever of 100F or higher   Following upper endoscopy (EGD)  Vomiting of blood or coffee ground material  New chest pain or pain under the shoulder blades  Painful or persistently difficult swallowing  New shortness  of breath  Fever of 100F or higher  Black, tarry-looking stools  For urgent or emergent issues, a gastroenterologist can be reached at any hour by calling [phone].   DIET:  We do recommend a small meal at first, but then you may proceed to your regular diet.  Drink plenty of fluids but you should avoid alcoholic beverages for 24 hours.  MEDICATIONS: Continue present medications. Use Prilosec (omeprazole) 20 mg by mouth daily indefinitely. This may help protect your stomach from the NSAIDs that you use chronically.  ACTIVITY:  You should plan to take it easy for the rest of today and you should NOT DRIVE or use heavy machinery until tomorrow (because of the sedation medicines used during the test).    FOLLOW UP: Our staff will call the number listed on your records the next business day following your procedure to check on you and address any questions or concerns that you may have regarding the information given to you following your procedure. If we do not reach you, we will leave a message.  However, if you are feeling well and you are not experiencing any problems, there is no need to return our call.  We will assume that you have returned to your regular daily activities without incident.  If any biopsies were taken you will be contacted by phone or by letter within the next 1-3 weeks.  Please call us at [phone] if you have not heard about the biopsies in 3 weeks.  SIGNATURES/CONFIDENTIALITY: You and/or your care partner have signed paperwork which will be entered into your electronic medical record.  These signatures attest to the fact that that the information above on your After Visit Summary has been reviewed and is understood.  Full responsibility of the confidentiality of this discharge information lies with you and/or your care-partner.

## 2018-12-23 NOTE — Progress Notes (Signed)
Spontaneous respirations throughout. VSS. Resting comfortably. To PACU on room air. Report to  RN. 

## 2018-12-23 NOTE — Progress Notes (Signed)
Called to room to assist during endoscopic procedure.  Patient ID and intended procedure confirmed with present staff. Received instructions for my participation in the procedure from the performing physician.  

## 2018-12-24 ENCOUNTER — Telehealth: Payer: Self-pay

## 2018-12-24 LAB — HELICOBACTER PYLORI SCREEN-BIOPSY: UREASE: NEGATIVE

## 2018-12-24 NOTE — Telephone Encounter (Signed)
  Follow up Call-  Call back number 12/23/2018  Post procedure Call Back phone  # [phone]  Permission to leave phone message Yes  Some recent data might be hidden     Patient questions:  Do you have a fever, pain , or abdominal swelling? No. Pain Score  0 *  Have you tolerated food without any problems? Yes.    Have you been able to return to your normal activities? Yes.    Do you have any questions about your discharge instructions: Diet   No. Medications  No. Follow up visit  No.  Do you have questions or concerns about your Care? No.  Actions: * If pain score is 4 or above: No action needed, pain <4.  No problems noted per pt. maw

## 2018-12-24 NOTE — Op Note (Signed)
[facility] Patient Name: Vanessa Boyd Procedure Date: 12/23/2018 1:06 PM MRN: [id number] Endoscopist: Iva Booparl E Gessner , MD Age: [age] Referring MD:  Date of Birth: [birth year] Gender: Female Account #: [id number] Procedure:                Upper GI endoscopy Indications:              Heme positive stool Medicines:                Propofol per Anesthesia, Monitored Anesthesia Care Procedure:                Pre-Anesthesia Assessment:                           - Prior to the procedure, a History and Physical                            was performed, and patient medications and                            allergies were reviewed. The patient's tolerance of                            previous anesthesia was also reviewed. The risks                            and benefits of the procedure and the sedation                            options and risks were discussed with the patient.                            All questions were answered, and informed consent                            was obtained. Prior Anticoagulants: The patient has                            taken no previous anticoagulant or antiplatelet                            agents. ASA Grade Assessment: II - A patient with                            mild systemic disease. After reviewing the risks                            and benefits, the patient was deemed in                            satisfactory condition to undergo the procedure.                           After obtaining informed consent, the endoscope was  passed under direct vision. Throughout the                            procedure, the patient's blood pressure, pulse, and                            oxygen saturations were monitored continuously. The                            Endoscope was introduced through the mouth, and                            advanced to the second part of duodenum. The upper                            GI endoscopy  was accomplished without difficulty.                            The patient tolerated the procedure well. Scope In: Scope Out: Findings:                 Multiple dispersed, small non-bleeding erosions                            were found in the prepyloric region of the stomach.                            There were stigmata of recent bleeding. Biopsies                            were taken with a cold forceps for Helicobacter                            pylori testing using CLOtest. Verification of                            patient identification for the specimen was done.                            Estimated blood loss was minimal.                           A 6 cm hiatal hernia with a few Cameron ulcers was                            found.                           The exam was otherwise without abnormality.                           The cardia and gastric fundus were normal on                            retroflexion. Complications:  No immediate complications. Estimated Blood Loss:     Estimated blood loss was minimal. Impression:               - Non-bleeding erosive gastropathy. Biopsied.                           - 6 cm hiatal hernia with a few Cameron ulcers.                           - The examination was otherwise normal. Recommendation:           - Patient has a contact number available for                            emergencies. The signs and symptoms of potential                            delayed complications were discussed with the                            patient. Return to normal activities tomorrow.                            Written discharge instructions were provided to the                            patient.                           - Resume previous diet.                           - Continue present medications.                           - Await pathology results.                           - Use Prilosec (omeprazole) 20 mg PO daily                             indefinitely. She takes chrionic NSAID and this may                            protect Iva Boop, MD 12/23/2018 2:26:33 PM This report has been signed electronically.

## 2018-12-24 NOTE — Op Note (Signed)
[facility] Patient Name: Vanessa Boyd Procedure Date: 12/23/2018 1:05 PM MRN: [id number] Endoscopist: Iva Boop , MD Age: 78 Referring MD:  Date of Birth: 02/08/1941 Gender: Female Account #: [id number] Procedure:                Colonoscopy Indications:              Heme positive stool Medicines:                Propofol per Anesthesia, Monitored Anesthesia Care Procedure:                Pre-Anesthesia Assessment:                           - Prior to the procedure, a History and Physical                            was performed, and patient medications and                            allergies were reviewed. The patient's tolerance of                            previous anesthesia was also reviewed. The risks                            and benefits of the procedure and the sedation                            options and risks were discussed with the patient.                            All questions were answered, and informed consent                            was obtained. Prior Anticoagulants: The patient has                            taken no previous anticoagulant or antiplatelet                            agents. ASA Grade Assessment: II - A patient with                            mild systemic disease. After reviewing the risks                            and benefits, the patient was deemed in                            satisfactory condition to undergo the procedure.                           After obtaining informed consent, the colonoscope  was passed under direct vision. Throughout the                            procedure, the patient's blood pressure, pulse, and                            oxygen saturations were monitored continuously. The                            Model CF-HQ190L [id number]) scope was introduced                            through the anus and advanced to the the cecum,                            identified by  appendiceal orifice and ileocecal                            valve. The colonoscopy was performed with moderate                            difficulty due to restricted mobility of the colon.                            Successful completion of the procedure was aided by                            changing endoscopes. The patient tolerated the                            procedure well. The quality of the bowel                            preparation was good. The ileocecal valve,                            appendiceal orifice, and rectum were photographed. Scope In: 1:34:08 PM Scope Out: 1:59:54 PM Scope Withdrawal Time: 0 hours 8 minutes 48 seconds  Total Procedure Duration: 0 hours 25 minutes 46 seconds  Findings:                 The perianal and digital rectal examinations were                            normal.                           Multiple small and large-mouthed diverticula were                            found in the sigmoid colon. There was narrowing of                            the colon in association with the diverticular  opening.                           The exam was otherwise without abnormality on                            direct and retroflexion views. Complications:            No immediate complications. Estimated Blood Loss:     Estimated blood loss: none. Impression:               - Severe diverticulosis in the sigmoid colon. There                            was narrowing of the colon in association with the                            diverticular opening. Required using pediatric                            colonoscope                           - The examination was otherwise normal on direct                            and retroflexion views.                           - No specimens collected. Recommendation:           - Patient has a contact number available for                            emergencies. The signs and symptoms of potential                             delayed complications were discussed with the                            patient. Return to normal activities tomorrow.                            Written discharge instructions were provided to the                            patient.                           - Resume previous diet.                           - Continue present medications.                           - No repeat colonoscopy due to age. No more  hemoccults either Iva Boop, MD 12/23/2018 2:28:49 PM This report has been signed electronically.

## 2018-12-25 ENCOUNTER — Encounter: Payer: Self-pay | Admitting: Internal Medicine

## 2018-12-25 NOTE — Progress Notes (Signed)
H pylori neg Stay on PP Letter to pt

## 2018-12-31 ENCOUNTER — Encounter (HOSPITAL_COMMUNITY): Payer: Self-pay

## 2018-12-31 ENCOUNTER — Other Ambulatory Visit: Payer: Self-pay

## 2018-12-31 ENCOUNTER — Ambulatory Visit (HOSPITAL_COMMUNITY)
Admission: RE | Admit: 2018-12-31 | Discharge: 2018-12-31 | Disposition: A | Discharge status: Home / Self Care | Payer: Medicare Other | Source: Ambulatory Visit | Attending: Internal Medicine | Admitting: Internal Medicine

## 2018-12-31 DIAGNOSIS — M810 Age-related osteoporosis without current pathological fracture: Secondary | ICD-10-CM | POA: Diagnosis present

## 2018-12-31 MED ORDER — DENOSUMAB 60 MG/ML ~~LOC~~ SOSY
60.0000 mg | PREFILLED_SYRINGE | Freq: Once | SUBCUTANEOUS | Status: AC
  Administered 2018-12-31: 60 mg via SUBCUTANEOUS
  Filled 2018-12-31: qty 1

## 2018-12-31 NOTE — Discharge Instructions (Signed)
Denosumab injection °What is this medicine? °DENOSUMAB (den oh sue mab) slows bone breakdown. Prolia is used to treat osteoporosis in women after menopause and in men, and in people who are taking corticosteroids for 6 months or more. Xgeva is used to treat a high calcium level due to cancer and to prevent bone fractures and other bone problems caused by multiple myeloma or cancer bone metastases. Xgeva is also used to treat giant cell tumor of the bone. °This medicine may be used for other purposes; ask your health care provider or pharmacist if you have questions. °COMMON BRAND NAME(S): Prolia, XGEVA °What should I tell my health care provider before I take this medicine? °They need to know if you have any of these conditions: °-dental disease °-having surgery or tooth extraction °-infection °-kidney disease °-low levels of calcium or Vitamin D in the blood °-malnutrition °-on hemodialysis °-skin conditions or sensitivity °-thyroid or parathyroid disease °-an unusual reaction to denosumab, other medicines, foods, dyes, or preservatives °-pregnant or trying to get pregnant °-breast-feeding °How should I use this medicine? °This medicine is for injection under the skin. It is given by a health care professional in a hospital or clinic setting. °A special MedGuide will be given to you before each treatment. Be sure to read this information carefully each time. °For Prolia, talk to your pediatrician regarding the use of this medicine in children. Special care may be needed. For Xgeva, talk to your pediatrician regarding the use of this medicine in children. While this drug may be prescribed for children as young as 13 years for selected conditions, precautions do apply. °Overdosage: If you think you have taken too much of this medicine contact a poison control center or emergency room at once. °NOTE: This medicine is only for you. Do not share this medicine with others. °What if I miss a dose? °It is important not to  miss your dose. Call your doctor or health care professional if you are unable to keep an appointment. °What may interact with this medicine? °Do not take this medicine with any of the following medications: °-other medicines containing denosumab °This medicine may also interact with the following medications: °-medicines that lower your chance of fighting infection °-steroid medicines like prednisone or cortisone °This list may not describe all possible interactions. Give your health care provider a list of all the medicines, herbs, non-prescription drugs, or dietary supplements you use. Also tell them if you smoke, drink alcohol, or use illegal drugs. Some items may interact with your medicine. °What should I watch for while using this medicine? °Visit your doctor or health care professional for regular checks on your progress. Your doctor or health care professional may order blood tests and other tests to see how you are doing. °Call your doctor or health care professional for advice if you get a fever, chills or sore throat, or other symptoms of a cold or flu. Do not treat yourself. This drug may decrease your body's ability to fight infection. Try to avoid being around people who are sick. °You should make sure you get enough calcium and vitamin D while you are taking this medicine, unless your doctor tells you not to. Discuss the foods you eat and the vitamins you take with your health care professional. °See your dentist regularly. Brush and floss your teeth as directed. Before you have any dental work done, tell your dentist you are receiving this medicine. °Do not become pregnant while taking this medicine or for 5 months   after stopping it. Talk with your doctor or health care professional about your birth control options while taking this medicine. Women should inform their doctor if they wish to become pregnant or think they might be pregnant. There is a potential for serious side effects to an unborn  child. Talk to your health care professional or pharmacist for more information. °What side effects may I notice from receiving this medicine? °Side effects that you should report to your doctor or health care professional as soon as possible: °-allergic reactions like skin rash, itching or hives, swelling of the face, lips, or tongue °-bone pain °-breathing problems °-dizziness °-jaw pain, especially after dental work °-redness, blistering, peeling of the skin °-signs and symptoms of infection like fever or chills; cough; sore throat; pain or trouble passing urine °-signs of low calcium like fast heartbeat, muscle cramps or muscle pain; pain, tingling, numbness in the hands or feet; seizures °-unusual bleeding or bruising °-unusually weak or tired °Side effects that usually do not require medical attention (report to your doctor or health care professional if they continue or are bothersome): °-constipation °-diarrhea °-headache °-joint pain °-loss of appetite °-muscle pain °-runny nose °-tiredness °-upset stomach °This list may not describe all possible side effects. Call your doctor for medical advice about side effects. You may report side effects to FDA at 1-800-FDA-1088. °Where should I keep my medicine? °This medicine is only given in a clinic, doctor's office, or other health care setting and will not be stored at home. °NOTE: This sheet is a summary. It may not cover all possible information. If you have questions about this medicine, talk to your doctor, pharmacist, or health care provider. °© 2019 [employer] (2018-02-14 16:10:44) ° °

## 2019-04-28 ENCOUNTER — Telehealth: Payer: Self-pay | Admitting: Gastroenterology

## 2019-04-28 NOTE — Telephone Encounter (Signed)
I can see her if desired and ok with Dr. Silverio Decamp  Sounds like she needs an in office evaluation for what sounds like symptomatic hemorrhoids.

## 2019-04-28 NOTE — Telephone Encounter (Signed)
Patient contacted. States she has an appointment 05/13/19 with [facility]. She has not had any further bleeding since we spoke this morning. She will purchase TUCK's or other hemorrhoidal wipe. Call back if she acutely worsens.

## 2019-04-28 NOTE — Telephone Encounter (Signed)
The patient calls with a request to change to Dr Vanessa Boyd. She feels she has rapport with Dr Vanessa Boyd. She is satisfied with her care by Dr Vanessa Boyd, but "felt a connection with Dr Vanessa Boyd." Dr Vanessa Boyd will you take the patient in your service? Dr Vanessa Boyd is this okay with you?  She also calls to report bright red blood seen past 2 bowel movements on the past 2 days. She will see this when cleaning. Also see some blood when cleaning after urination. Denies any pain, nausea, fever or changes in her bowel movements. Thinks maybe a little protrusion at the rectum, but she is not certain. Please advise.

## 2019-04-28 NOTE — Telephone Encounter (Signed)
Pt reported rectal bleeding.  Please advise.

## 2019-04-28 NOTE — Telephone Encounter (Signed)
Patient called back

## 2019-04-28 NOTE — Telephone Encounter (Signed)
Called to the patient. No answer. Left a voicemail to call back when she is available.

## 2019-05-13 ENCOUNTER — Ambulatory Visit: Payer: Medicare Other | Admitting: Physician Assistant

## 2019-07-06 ENCOUNTER — Ambulatory Visit (HOSPITAL_COMMUNITY)
Admission: RE | Admit: 2019-07-06 | Discharge: 2019-07-06 | Disposition: A | Discharge status: Home / Self Care | Payer: Medicare Other | Source: Ambulatory Visit | Attending: Internal Medicine | Admitting: Internal Medicine

## 2019-07-06 ENCOUNTER — Other Ambulatory Visit: Payer: Self-pay

## 2019-07-06 ENCOUNTER — Encounter (HOSPITAL_COMMUNITY): Payer: Self-pay

## 2019-07-06 DIAGNOSIS — M810 Age-related osteoporosis without current pathological fracture: Secondary | ICD-10-CM | POA: Insufficient documentation

## 2019-07-06 MED ORDER — DENOSUMAB 60 MG/ML ~~LOC~~ SOSY
60.0000 mg | PREFILLED_SYRINGE | Freq: Once | SUBCUTANEOUS | Status: AC
  Administered 2019-07-06: 11:00:00 60 mg via SUBCUTANEOUS
  Filled 2019-07-06: qty 1

## 2019-07-06 NOTE — Discharge Instructions (Signed)
Denosumab injection °What is this medicine? °DENOSUMAB (den oh sue mab) slows bone breakdown. Prolia is used to treat osteoporosis in women after menopause and in men, and in people who are taking corticosteroids for 6 months or more. Xgeva is used to treat a high calcium level due to cancer and to prevent bone fractures and other bone problems caused by multiple myeloma or cancer bone metastases. Xgeva is also used to treat giant cell tumor of the bone. °This medicine may be used for other purposes; ask your health care provider or pharmacist if you have questions. °COMMON BRAND NAME(S): Prolia, XGEVA °What should I tell my health care provider before I take this medicine? °They need to know if you have any of these conditions: °· dental disease °· having surgery or tooth extraction °· infection °· kidney disease °· low levels of calcium or Vitamin D in the blood °· malnutrition °· on hemodialysis °· skin conditions or sensitivity °· thyroid or parathyroid disease °· an unusual reaction to denosumab, other medicines, foods, dyes, or preservatives °· pregnant or trying to get pregnant °· breast-feeding °How should I use this medicine? °This medicine is for injection under the skin. It is given by a health care professional in a hospital or clinic setting. °A special MedGuide will be given to you before each treatment. Be sure to read this information carefully each time. °For Prolia, talk to your pediatrician regarding the use of this medicine in children. Special care may be needed. For Xgeva, talk to your pediatrician regarding the use of this medicine in children. While this drug may be prescribed for children as young as 13 years for selected conditions, precautions do apply. °Overdosage: If you think you have taken too much of this medicine contact a poison control center or emergency room at once. °NOTE: This medicine is only for you. Do not share this medicine with others. °What if I miss a dose? °It is  important not to miss your dose. Call your doctor or health care professional if you are unable to keep an appointment. °What may interact with this medicine? °Do not take this medicine with any of the following medications: °· other medicines containing denosumab °This medicine may also interact with the following medications: °· medicines that lower your chance of fighting infection °· steroid medicines like prednisone or cortisone °This list may not describe all possible interactions. Give your health care provider a list of all the medicines, herbs, non-prescription drugs, or dietary supplements you use. Also tell them if you smoke, drink alcohol, or use illegal drugs. Some items may interact with your medicine. °What should I watch for while using this medicine? °Visit your doctor or health care professional for regular checks on your progress. Your doctor or health care professional may order blood tests and other tests to see how you are doing. °Call your doctor or health care professional for advice if you get a fever, chills or sore throat, or other symptoms of a cold or flu. Do not treat yourself. This drug may decrease your body's ability to fight infection. Try to avoid being around people who are sick. °You should make sure you get enough calcium and vitamin D while you are taking this medicine, unless your doctor tells you not to. Discuss the foods you eat and the vitamins you take with your health care professional. °See your dentist regularly. Brush and floss your teeth as directed. Before you have any dental work done, tell your dentist you are   receiving this medicine. Do not become pregnant while taking this medicine or for 5 months after stopping it. Talk with your doctor or health care professional about your birth control options while taking this medicine. Women should inform their doctor if they wish to become pregnant or think they might be pregnant. There is a potential for serious side  effects to an unborn child. Talk to your health care professional or pharmacist for more information. What side effects may I notice from receiving this medicine? Side effects that you should report to your doctor or health care professional as soon as possible:  allergic reactions like skin rash, itching or hives, swelling of the face, lips, or tongue  bone pain  breathing problems  dizziness  jaw pain, especially after dental work  redness, blistering, peeling of the skin  signs and symptoms of infection like fever or chills; cough; sore throat; pain or trouble passing urine  signs of low calcium like fast heartbeat, muscle cramps or muscle pain; pain, tingling, numbness in the hands or feet; seizures  unusual bleeding or bruising  unusually weak or tired Side effects that usually do not require medical attention (report to your doctor or health care professional if they continue or are bothersome):  constipation  diarrhea  headache  joint pain  loss of appetite  muscle pain  runny nose  tiredness  upset stomach This list may not describe all possible side effects. Call your doctor for medical advice about side effects. You may report side effects to FDA at 1-800-FDA-1088. Where should I keep my medicine? This medicine is only given in a clinic, doctor's office, or other health care setting and will not be stored at home. NOTE: This sheet is a summary. It may not cover all possible information. If you have questions about this medicine, talk to your doctor, pharmacist, or health care provider.  2020 Elsevier/Gold Standard (2018-02-14 16:10:44)

## 2019-07-06 NOTE — Progress Notes (Signed)
PT. Ms. Vanessa Boyd in today for Prolia injection.   No changes since last injection/medication.  States she had been doing well.  No change in Hx./Allergies/ etc Provided next appointment 01/04/2020.

## 2019-11-03 ENCOUNTER — Ambulatory Visit: Payer: Medicare Other | Attending: Internal Medicine

## 2019-11-03 DIAGNOSIS — Z23 Encounter for immunization: Secondary | ICD-10-CM | POA: Insufficient documentation

## 2019-11-03 NOTE — Progress Notes (Signed)
   Covid-19 Vaccination Clinic  Name:  Vanessa Boyd    MRN: [id number] DOB: December 11, 1940  11/03/2019  [name] was observed post Covid-19 immunization for 15 minutes without incidence. She was provided with Vaccine Information Sheet and instruction to access the V-Safe system.   [name] was instructed to call 911 with any severe reactions post vaccine: Marland Kitchen Difficulty breathing  . Swelling of your face and throat  . A fast heartbeat  . A bad rash all over your body  . Dizziness and weakness    Immunizations Administered    Name Date Dose VIS Date Route   Pfizer COVID-19 Vaccine 11/03/2019 10:41 AM 0.3 mL 10/02/2019 Intramuscular   Manufacturer: [employer], [employer]   Lot: [id number]   NDC: [address]-3

## 2019-11-23 ENCOUNTER — Ambulatory Visit: Payer: Medicare Other | Attending: Internal Medicine

## 2019-11-23 DIAGNOSIS — Z23 Encounter for immunization: Secondary | ICD-10-CM | POA: Insufficient documentation

## 2019-11-23 NOTE — Progress Notes (Deleted)
   Covid-19 Vaccination Clinic  Name:  Vanessa Boyd    MRN: [id number] DOB: [birth year]  11/23/2019  [name] was observed post Covid-19 immunization for 15 minutes without incidence. She was provided with Vaccine Information Sheet and instruction to access the V-Safe system.   [name] was instructed to call 911 with any severe reactions post vaccine: Marland Kitchen Difficulty breathing  . Swelling of your face and throat  . A fast heartbeat  . A bad rash all over your body  . Dizziness and weakness    Immunizations Administered    Name Date Dose VIS Date Route   Pfizer COVID-19 Vaccine 11/23/2019  8:16 AM 0.3 mL 10/02/2019 Intramuscular   Manufacturer: [employer], [employer]   Lot: [id number]   NDC: [address]-0

## 2019-11-23 NOTE — Progress Notes (Signed)
   Covid-19 Vaccination Clinic  Name:  Vanessa Boyd    MRN: [id number] DOB: 08/21/1941  11/23/2019  [name] was observed post Covid-19 immunization for 15 minutes without incidence. She was provided with Vaccine Information Sheet and instruction to access the V-Safe system.   [name] was instructed to call 911 with any severe reactions post vaccine: . Difficulty breathing  . Swelling of your face and throat  . A fast heartbeat  . A bad rash all over your body  . Dizziness and weakness    Immunizations Administered    Name Date Dose VIS Date Route   Pfizer COVID-19 Vaccine 11/23/2019  8:16 AM 0.3 mL 10/02/2019 Intramuscular   Manufacturer: Pfizer[employer] Inc   Lot: [id number]   NDC: [address]-1     

## 2019-12-14 ENCOUNTER — Other Ambulatory Visit: Payer: Self-pay | Admitting: Internal Medicine

## 2020-01-04 ENCOUNTER — Encounter (HOSPITAL_COMMUNITY): Payer: Self-pay

## 2020-01-04 ENCOUNTER — Ambulatory Visit (HOSPITAL_COMMUNITY)
Admission: RE | Admit: 2020-01-04 | Discharge: 2020-01-04 | Disposition: A | Discharge status: Home / Self Care | Payer: Medicare Other | Source: Ambulatory Visit | Attending: Internal Medicine | Admitting: Internal Medicine

## 2020-01-04 ENCOUNTER — Other Ambulatory Visit: Payer: Self-pay

## 2020-01-04 DIAGNOSIS — M810 Age-related osteoporosis without current pathological fracture: Secondary | ICD-10-CM | POA: Insufficient documentation

## 2020-01-04 MED ORDER — DENOSUMAB 60 MG/ML ~~LOC~~ SOSY
60.0000 mg | PREFILLED_SYRINGE | Freq: Once | SUBCUTANEOUS | Status: AC
  Administered 2020-01-04: 10:00:00 60 mg via SUBCUTANEOUS
  Filled 2020-01-04: qty 1

## 2020-01-04 NOTE — Discharge Instructions (Signed)
Denosumab injection What is this medicine? DENOSUMAB (den oh sue mab) slows bone breakdown. Prolia is used to treat osteoporosis in women after menopause and in men, and in people who are taking corticosteroids for 6 months or more. Xgeva is used to treat a high calcium level due to cancer and to prevent bone fractures and other bone problems caused by multiple myeloma or cancer bone metastases. Xgeva is also used to treat giant cell tumor of the bone. This medicine may be used for other purposes; ask your health care provider or pharmacist if you have questions. COMMON BRAND NAME(S): Prolia, XGEVA What should I tell my health care provider before I take this medicine? They need to know if you have any of these conditions:  dental disease  having surgery or tooth extraction  infection  kidney disease  low levels of calcium or Vitamin D in the blood  malnutrition  on hemodialysis  skin conditions or sensitivity  thyroid or parathyroid disease  an unusual reaction to denosumab, other medicines, foods, dyes, or preservatives  pregnant or trying to get pregnant  breast-feeding How should I use this medicine? This medicine is for injection under the skin. It is given by a health care professional in a hospital or clinic setting. A special MedGuide will be given to you before each treatment. Be sure to read this information carefully each time. For Prolia, talk to your pediatrician regarding the use of this medicine in children. Special care may be needed. For Xgeva, talk to your pediatrician regarding the use of this medicine in children. While this drug may be prescribed for children as young as 13 years for selected conditions, precautions do apply. Overdosage: If you think you have taken too much of this medicine contact a poison control center or emergency room at once. NOTE: This medicine is only for you. Do not share this medicine with others. What if I miss a dose? It is  important not to miss your dose. Call your doctor or health care professional if you are unable to keep an appointment. What may interact with this medicine? Do not take this medicine with any of the following medications:  other medicines containing denosumab This medicine may also interact with the following medications:  medicines that lower your chance of fighting infection  steroid medicines like prednisone or cortisone This list may not describe all possible interactions. Give your health care provider a list of all the medicines, herbs, non-prescription drugs, or dietary supplements you use. Also tell them if you smoke, drink alcohol, or use illegal drugs. Some items may interact with your medicine. What should I watch for while using this medicine? Visit your doctor or health care professional for regular checks on your progress. Your doctor or health care professional may order blood tests and other tests to see how you are doing. Call your doctor or health care professional for advice if you get a fever, chills or sore throat, or other symptoms of a cold or flu. Do not treat yourself. This drug may decrease your body's ability to fight infection. Try to avoid being around people who are sick. You should make sure you get enough calcium and vitamin D while you are taking this medicine, unless your doctor tells you not to. Discuss the foods you eat and the vitamins you take with your health care professional. See your dentist regularly. Brush and floss your teeth as directed. Before you have any dental work done, tell your dentist you are   receiving this medicine. Do not become pregnant while taking this medicine or for 5 months after stopping it. Talk with your doctor or health care professional about your birth control options while taking this medicine. Women should inform their doctor if they wish to become pregnant or think they might be pregnant. There is a potential for serious side  effects to an unborn child. Talk to your health care professional or pharmacist for more information. What side effects may I notice from receiving this medicine? Side effects that you should report to your doctor or health care professional as soon as possible:  allergic reactions like skin rash, itching or hives, swelling of the face, lips, or tongue  bone pain  breathing problems  dizziness  jaw pain, especially after dental work  redness, blistering, peeling of the skin  signs and symptoms of infection like fever or chills; cough; sore throat; pain or trouble passing urine  signs of low calcium like fast heartbeat, muscle cramps or muscle pain; pain, tingling, numbness in the hands or feet; seizures  unusual bleeding or bruising  unusually weak or tired Side effects that usually do not require medical attention (report to your doctor or health care professional if they continue or are bothersome):  constipation  diarrhea  headache  joint pain  loss of appetite  muscle pain  runny nose  tiredness  upset stomach This list may not describe all possible side effects. Call your doctor for medical advice about side effects. You may report side effects to FDA at 1-800-FDA-1088. Where should I keep my medicine? This medicine is only given in a clinic, doctor's office, or other health care setting and will not be stored at home. NOTE: This sheet is a summary. It may not cover all possible information. If you have questions about this medicine, talk to your doctor, pharmacist, or health care provider.  2020 Elsevier/Gold Standard (2018-02-14 16:10:44)  

## 2020-03-16 ENCOUNTER — Other Ambulatory Visit: Payer: Self-pay | Admitting: Internal Medicine

## 2020-03-16 DIAGNOSIS — G8929 Other chronic pain: Secondary | ICD-10-CM

## 2020-03-16 DIAGNOSIS — M545 Low back pain, unspecified: Secondary | ICD-10-CM

## 2020-04-19 ENCOUNTER — Ambulatory Visit
Admission: RE | Admit: 2020-04-19 | Discharge: 2020-04-19 | Disposition: A | Discharge status: Home / Self Care | Payer: Medicare Other | Source: Ambulatory Visit | Attending: Internal Medicine | Admitting: Internal Medicine

## 2020-04-19 DIAGNOSIS — M545 Low back pain, unspecified: Secondary | ICD-10-CM

## 2020-05-03 ENCOUNTER — Other Ambulatory Visit: Payer: Self-pay | Admitting: Neurosurgery

## 2020-05-19 ENCOUNTER — Other Ambulatory Visit: Payer: Self-pay | Admitting: Neurosurgery

## 2020-05-31 NOTE — Pre-Procedure Instructions (Addendum)
[facility] #[id number] [name], [place] - [address] AT [facility] OF [facility] [address] & [facility] [address] [name] [address] Phone: [phone] Fax: [phone]     Your procedure is scheduled on Wednesday, August 18, from 11:00 AM- 3:04 PM.  Report to [facility] Main Entrance "A" at 09:00 A.M., and check in at the Admitting office.  Call this number if you have problems the morning of surgery:  [phone]  Call [phone] if you have any questions prior to your surgery date Monday-Friday 8am-4pm.    Remember:  Do not eat or drink after midnight the night before your surgery.     Take these medicines the morning of surgery with A SIP OF WATER: omeprazole (PRILOSEC) simvastatin (ZOCOR)   As of today, STOP taking any Aspirin (unless otherwise instructed by your surgeon) Aleve, Naproxen, Ibuprofen, Motrin, Advil, Goody's, BC's, all herbal medications, fish oil, and all vitamins.    WHAT DO I DO ABOUT MY DIABETES MEDICATION?  Marland Kitchen Do not take metFORMIN (GLUCOPHAGE-XR) the morning of surgery.   HOW TO MANAGE YOUR DIABETES BEFORE AND AFTER SURGERY  Why is it important to control my blood sugar before and after surgery? . Improving blood sugar levels before and after surgery helps healing and can limit problems. . A way of improving blood sugar control is eating a healthy diet by: o  Eating less sugar and carbohydrates o  Increasing activity/exercise o  Talking with your doctor about reaching your blood sugar goals . High blood sugars (greater than 180 mg/dL) can raise your risk of infections and slow your recovery, so you will need to focus on controlling your diabetes during the weeks before surgery. . Make sure that the doctor who takes care of your diabetes knows about your planned surgery including the date and location.  How do I manage my blood sugar before surgery? . Check your blood sugar at least 4 times a day, starting 2 days before surgery, to  make sure that the level is not too high or low. . Check your blood sugar the morning of your surgery when you wake up and every 2 hours until you get to the Short Stay unit. o If your blood sugar is less than 70 mg/dL, you will need to treat for low blood sugar: - Do not take insulin. - Treat a low blood sugar (less than 70 mg/dL) with  cup of clear juice (cranberry or apple), 4 glucose tablets, OR glucose gel. - Recheck blood sugar in 15 minutes after treatment (to make sure it is greater than 70 mg/dL). If your blood sugar is not greater than 70 mg/dL on recheck, call [phone] for further instructions. . Report your blood sugar to the short stay nurse when you get to Short Stay.  . If you are admitted to the hospital after surgery: o Your blood sugar will be checked by the staff and you will probably be given insulin after surgery (instead of oral diabetes medicines) to make sure you have good blood sugar levels. o The goal for blood sugar control after surgery is 80-180 mg/dL.       The Morning of Surgery:                Do not wear jewelry, make up, or nail polish.            Do not wear lotions, powders, perfumes, or deodorant.            Do  not shave 48 hours prior to surgery.              Do not bring valuables to the hospital.            [facility] is not responsible for any belongings or valuables.  Do NOT Smoke (Tobacco/Vaping) or drink Alcohol 24 hours prior to your procedure.  If you use a CPAP at night, you may bring all equipment for your overnight stay.   Contacts, glasses, dentures or bridgework may not be worn into surgery.      For patients admitted to the hospital, discharge time will be determined by your treatment team.   Patients discharged the day of surgery will not be allowed to drive home, and someone needs to stay with them for 24 hours.    Special instructions:   [facility]- Preparing For Surgery  Before surgery, you can play an important role.  Because skin is not sterile, your skin needs to be as free of germs as possible. You can reduce the number of germs on your skin by washing with CHG (chlorahexidine gluconate) Soap before surgery.  CHG is an antiseptic cleaner which kills germs and bonds with the skin to continue killing germs even after washing.    Oral Hygiene is also important to reduce your risk of infection.  Remember - BRUSH YOUR TEETH THE MORNING OF SURGERY WITH YOUR REGULAR TOOTHPASTE  Please do not use if you have an allergy to CHG or antibacterial soaps. If your skin becomes reddened/irritated stop using the CHG.  Do not shave (including legs and underarms) for at least 48 hours prior to first CHG shower. It is OK to shave your face.  Please follow these instructions carefully.   1. Shower the NIGHT BEFORE SURGERY and the MORNING OF SURGERY with CHG Soap.   2. If you chose to wash your hair, wash your hair first as usual with your normal shampoo.  3. After you shampoo, rinse your hair and body thoroughly to remove the shampoo.  4. Use CHG as you would any other liquid soap. You can apply CHG directly to the skin and wash gently with a scrungie or a clean washcloth.   5. Apply the CHG Soap to your body ONLY FROM THE NECK DOWN.  Do not use on open wounds or open sores. Avoid contact with your eyes, ears, mouth and genitals (private parts). Wash Face and genitals (private parts)  with your normal soap.   6. Wash thoroughly, paying special attention to the area where your surgery will be performed.  7. Thoroughly rinse your body with warm water from the neck down.  8. DO NOT shower/wash with your normal soap after using and rinsing off the CHG Soap.  9. Pat yourself dry with a CLEAN TOWEL.  10. Wear CLEAN PAJAMAS to bed the night before surgery  11. Place CLEAN SHEETS on your bed the night of your first shower and DO NOT SLEEP WITH PETS.   Day of Surgery: Wear Clean/Comfortable clothing the morning of  surgery. Do not apply any deodorants/lotions.   Remember to brush your teeth WITH YOUR REGULAR TOOTHPASTE.   Please read over the following fact sheets that you were given.

## 2020-06-01 ENCOUNTER — Encounter (HOSPITAL_COMMUNITY)
Admission: RE | Admit: 2020-06-01 | Discharge: 2020-06-01 | Disposition: A | Discharge status: Home / Self Care | Payer: Medicare Other | Source: Ambulatory Visit | Attending: Neurosurgery | Admitting: Neurosurgery

## 2020-06-01 ENCOUNTER — Other Ambulatory Visit (HOSPITAL_COMMUNITY): Payer: Medicare Other

## 2020-06-01 ENCOUNTER — Encounter (HOSPITAL_COMMUNITY): Payer: Self-pay

## 2020-06-01 ENCOUNTER — Other Ambulatory Visit: Payer: Self-pay

## 2020-06-01 DIAGNOSIS — E119 Type 2 diabetes mellitus without complications: Secondary | ICD-10-CM | POA: Insufficient documentation

## 2020-06-01 DIAGNOSIS — Z01818 Encounter for other preprocedural examination: Secondary | ICD-10-CM | POA: Insufficient documentation

## 2020-06-01 LAB — BASIC METABOLIC PANEL
Anion gap: 10 (ref 5–15)
BUN: 18 mg/dL (ref 8–23)
CO2: 27 mmol/L (ref 22–32)
Calcium: 9.9 mg/dL (ref 8.9–10.3)
Chloride: 101 mmol/L (ref 98–111)
Creatinine, Ser: 0.69 mg/dL (ref 0.44–1.00)
GFR calc Af Amer: >60 mL/min (ref >60)
GFR calc non Af Amer: >60 mL/min (ref >60)
Glucose, Bld: 111 mg/dL — ABNORMAL HIGH (ref 70–99)
Potassium: 4.2 mmol/L (ref 3.5–5.1)
Sodium: 138 mmol/L (ref 135–145)

## 2020-06-01 LAB — CBC
HCT: 40.4 % (ref 36.0–46.0)
Hemoglobin: 12.2 g/dL (ref 12.0–15.0)
MCH: 27.4 pg (ref 26.0–34.0)
MCHC: 30.2 g/dL (ref 30.0–36.0)
MCV: 90.8 fL (ref 80.0–100.0)
Platelets: 274 10*3/uL (ref 150–400)
RBC: 4.45 MIL/uL (ref 3.87–5.11)
RDW: 15.4 % (ref 11.5–15.5)
WBC: 7 10*3/uL (ref 4.0–10.5)
nRBC: 0 % (ref 0.0–0.2)

## 2020-06-01 LAB — SURGICAL PCR SCREEN
MRSA, PCR: NEGATIVE
Staphylococcus aureus: NEGATIVE

## 2020-06-01 LAB — TYPE AND SCREEN
ABO/RH(D): A POS
Antibody Screen: NEGATIVE

## 2020-06-01 LAB — GLUCOSE, CAPILLARY: Glucose-Capillary: 100 mg/dL — ABNORMAL HIGH (ref 70–99)

## 2020-06-01 NOTE — Progress Notes (Signed)
PCP - Ivery Quale Cardiologist - denies  Chest x-ray - N/A EKG - 06/01/20 Stress Test - denies ECHO - denies Cardiac Cath - denies   Pt does not check CBG at home   Aspirin Instructions: Patient instructed to hold all Aspirin, NSAID's, herbal medications, fish oil and vitamins 7 days prior to surgery.    COVID TEST-  06/06/20 at at [facility]. Pt instructed to remain in their car. Educated on Haematologist until [employer]    Anesthesia review:   Patient denies shortness of breath, fever, cough and chest pain at PAT appointment   All instructions explained to the patient, with a verbal understanding of the material. Patient agrees to go over the instructions while at home for a better understanding. Patient also instructed to self quarantine after being tested for COVID-19. The opportunity to ask questions was provided.

## 2020-06-01 NOTE — Pre-Procedure Instructions (Signed)
[facility] DRUG STORE #[id number] [place],  - [address] AT [facility] OF [facility] [address] & [facility] [address] [place] [address] Phone: [phone] Fax: [phone]     Your procedure is scheduled on Wednesday, August 18th  Report to [facility] Main Entrance "A" at 09:00 A.M., and check in  at the Admitting office.  Call this number if you have problems the morning of surgery:  [phone]  Call [phone] if you have any questions prior to your surgery date Monday-Friday 8am-4pm.    Remember:  Do not eat or drink after midnight the night before your surgery.     Take these medicines the morning of surgery with A SIP OF WATER: omeprazole (PRILOSEC) simvastatin (ZOCOR)   As of today, STOP taking any Aspirin (unless otherwise instructed by your surgeon) Aleve, Naproxen, Ibuprofen, Motrin, Advil, Goody's, BC's, all herbal medications, fish oil, and all vitamins.    WHAT DO I DO ABOUT MY DIABETES MEDICATION?   Do not take metFORMIN (GLUCOPHAGE-XR) the morning of surgery.   HOW TO MANAGE YOUR DIABETES BEFORE AND AFTER SURGERY  Why is it important to control my blood sugar before and after surgery?  Improving blood sugar levels before and after surgery helps healing and can limit problems.  A way of improving blood sugar control is eating a healthy diet by: o  Eating less sugar and carbohydrates o  Increasing activity/exercise o  Talking with your doctor about reaching your blood sugar goals  High blood sugars (greater than 180 mg/dL) can raise your risk of infections and slow your recovery, so you will need to focus on controlling your diabetes during the weeks before surgery.  Make sure that the doctor who takes care of your diabetes knows about your planned surgery including the date and location.  How do I manage my blood sugar before surgery?  Check your blood sugar at least 4 times a day, starting 2 days before surgery, to make sure that the  level is not too high or low.  Check your blood sugar the morning of your surgery when you wake up and every 2 hours until you get to the Short Stay unit. o If your blood sugar is less than 70 mg/dL, you will need to treat for low blood sugar: - Do not take insulin. - Treat a low blood sugar (less than 70 mg/dL) with  cup of clear juice (cranberry or apple), 4 glucose tablets, OR glucose gel. - Recheck blood sugar in 15 minutes after treatment (to make sure it is greater than 70 mg/dL). If your blood sugar is not greater than 70 mg/dL on recheck, call [phone] for further instructions.  Report your blood sugar to the short stay nurse when you get to Short Stay.   If you are admitted to the hospital after surgery: o Your blood sugar will be checked by the staff and you will probably be given insulin after surgery (instead of oral diabetes medicines) to make sure you have good blood sugar levels. o The goal for blood sugar control after surgery is 80-180 mg/dL.       The Morning of Surgery:                Do not wear jewelry, make up, or nail polish.            Do not wear lotions, powders, perfumes, or deodorant.            Do not shave 48 hours  prior to surgery.              Do not bring valuables to the hospital.            [facility] is not responsible for any belongings or valuables.  Do NOT Smoke (Tobacco/Vaping) or drink Alcohol 24 hours prior to your procedure.  If you use a CPAP at night, you may bring all equipment for your overnight stay.   Contacts, glasses, dentures or bridgework may not be worn into surgery.      For patients admitted to the hospital, discharge time will be determined by your treatment team.   Patients discharged the day of surgery will not be allowed to drive home, and someone needs to stay with them for 24 hours.    Special instructions:   [facility]- Preparing For Surgery  Before surgery, you can play an important role. Because skin is not  sterile, your skin needs to be as free of germs as possible. You can reduce the number of germs on your skin by washing with CHG (chlorahexidine gluconate) Soap before surgery.  CHG is an antiseptic cleaner which kills germs and bonds with the skin to continue killing germs even after washing.    Oral Hygiene is also important to reduce your risk of infection.  Remember - BRUSH YOUR TEETH THE MORNING OF SURGERY WITH YOUR REGULAR TOOTHPASTE  Please do not use if you have an allergy to CHG or antibacterial soaps. If your skin becomes reddened/irritated stop using the CHG.  Do not shave (including legs and underarms) for at least 48 hours prior to first CHG shower. It is OK to shave your face.  Please follow these instructions carefully.   1. Shower the NIGHT BEFORE SURGERY and the MORNING OF SURGERY with CHG Soap.   2. If you chose to wash your hair, wash your hair first as usual with your normal shampoo.  3. After you shampoo, rinse your hair and body thoroughly to remove the shampoo.  4. Use CHG as you would any other liquid soap. You can apply CHG directly to the skin and wash gently with a scrungie or a clean washcloth.   5. Apply the CHG Soap to your body ONLY FROM THE NECK DOWN.  Do not use on open wounds or open sores. Avoid contact with your eyes, ears, mouth and genitals (private parts). Wash Face and genitals (private parts)  with your normal soap.   6. Wash thoroughly, paying special attention to the area where your surgery will be performed.  7. Thoroughly rinse your body with warm water from the neck down.  8. DO NOT shower/wash with your normal soap after using and rinsing off the CHG Soap.  9. Pat yourself dry with a CLEAN TOWEL.  10. Wear CLEAN PAJAMAS to bed the night before surgery  11. Place CLEAN SHEETS on your bed the night of your first shower and DO NOT SLEEP WITH PETS.   Day of Surgery: Wear Clean/Comfortable clothing the morning of surgery. Do not apply any  deodorants/lotions.   Remember to brush your teeth WITH YOUR REGULAR TOOTHPASTE.   Please read over the following fact sheets that you were given.

## 2020-06-06 ENCOUNTER — Other Ambulatory Visit (HOSPITAL_COMMUNITY)
Admission: RE | Admit: 2020-06-06 | Discharge: 2020-06-06 | Disposition: A | Discharge status: Home / Self Care | Payer: Medicare Other | Source: Ambulatory Visit | Attending: Neurosurgery | Admitting: Neurosurgery

## 2020-06-06 DIAGNOSIS — Z01812 Encounter for preprocedural laboratory examination: Secondary | ICD-10-CM | POA: Insufficient documentation

## 2020-06-06 DIAGNOSIS — Z20822 Contact with and (suspected) exposure to covid-19: Secondary | ICD-10-CM | POA: Insufficient documentation

## 2020-06-06 LAB — SARS CORONAVIRUS 2 (TAT 6-24 HRS): SARS Coronavirus 2: NEGATIVE

## 2020-06-07 NOTE — Anesthesia Preprocedure Evaluation (Addendum)
Anesthesia Evaluation  Patient identified by MRN, date of birth, ID band Patient awake    Reviewed: Allergy & Precautions, NPO status , Patient's Chart, lab work & pertinent test results  Airway Mallampati: II  TM Distance: >3 FB Neck ROM: Full    Dental  (+) Lower Dentures, Upper Dentures   Pulmonary former smoker,    Pulmonary exam normal breath sounds clear to auscultation       Cardiovascular hypertension, Normal cardiovascular exam Rhythm:Regular Rate:Normal     Neuro/Psych SPONDYLOLISTHESIS, LUMBAR REGION negative psych ROS   GI/Hepatic Neg liver ROS, GERD  Medicated and Controlled,  Endo/Other  diabetes, Type 2, Oral Hypoglycemic AgentsObesity   Renal/GU negative Renal ROS     Musculoskeletal negative musculoskeletal ROS (+)   Abdominal   Peds  Hematology negative hematology ROS (+)   Anesthesia Other Findings   Reproductive/Obstetrics                           Anesthesia Physical Anesthesia Plan  ASA: II  Anesthesia Plan: General   Post-op Pain Management:    Induction: Intravenous  PONV Risk Score and Plan: 3 and Dexamethasone, Ondansetron, Treatment may vary due to age or medical condition and Propofol infusion  Airway Management Planned: Oral ETT  Additional Equipment:   Intra-op Plan:   Post-operative Plan: Extubation in OR  Informed Consent: I have reviewed the patients History and Physical, chart, labs and discussed the procedure including the risks, benefits and alternatives for the proposed anesthesia with the patient or authorized representative who has indicated his/her understanding and acceptance.     Dental advisory given  Plan Discussed with: CRNA  Anesthesia Plan Comments:       Anesthesia Quick Evaluation

## 2020-06-08 ENCOUNTER — Other Ambulatory Visit: Payer: Self-pay

## 2020-06-08 ENCOUNTER — Inpatient Hospital Stay (HOSPITAL_COMMUNITY): Payer: Medicare Other

## 2020-06-08 ENCOUNTER — Encounter (HOSPITAL_COMMUNITY): Payer: Self-pay | Admitting: Neurosurgery

## 2020-06-08 ENCOUNTER — Inpatient Hospital Stay (HOSPITAL_COMMUNITY): Payer: Medicare Other | Admitting: Certified Registered Nurse Anesthetist

## 2020-06-08 ENCOUNTER — Encounter (HOSPITAL_COMMUNITY)
Admission: RE | Disposition: A | Discharge status: Home / Self Care | Payer: Self-pay | Source: Home / Self Care | Attending: Neurosurgery

## 2020-06-08 ENCOUNTER — Inpatient Hospital Stay (HOSPITAL_COMMUNITY)
Admission: RE | Admit: 2020-06-08 | Discharge: 2020-06-11 | DRG: 455 | Disposition: A | Discharge status: Home Health | Payer: Medicare Other | Attending: Neurosurgery | Admitting: Neurosurgery

## 2020-06-08 DIAGNOSIS — M7138 Other bursal cyst, other site: Secondary | ICD-10-CM | POA: Diagnosis present

## 2020-06-08 DIAGNOSIS — Z87891 Personal history of nicotine dependence: Secondary | ICD-10-CM

## 2020-06-08 DIAGNOSIS — Z6830 Body mass index (BMI) 30.0-30.9, adult: Secondary | ICD-10-CM

## 2020-06-08 DIAGNOSIS — E119 Type 2 diabetes mellitus without complications: Secondary | ICD-10-CM | POA: Diagnosis present

## 2020-06-08 DIAGNOSIS — Z7984 Long term (current) use of oral hypoglycemic drugs: Secondary | ICD-10-CM

## 2020-06-08 DIAGNOSIS — M4726 Other spondylosis with radiculopathy, lumbar region: Secondary | ICD-10-CM | POA: Diagnosis present

## 2020-06-08 DIAGNOSIS — M810 Age-related osteoporosis without current pathological fracture: Secondary | ICD-10-CM | POA: Diagnosis present

## 2020-06-08 DIAGNOSIS — I10 Essential (primary) hypertension: Secondary | ICD-10-CM | POA: Diagnosis present

## 2020-06-08 DIAGNOSIS — E669 Obesity, unspecified: Secondary | ICD-10-CM | POA: Diagnosis present

## 2020-06-08 DIAGNOSIS — Z79899 Other long term (current) drug therapy: Secondary | ICD-10-CM | POA: Diagnosis not present

## 2020-06-08 DIAGNOSIS — M5116 Intervertebral disc disorders with radiculopathy, lumbar region: Principal | ICD-10-CM | POA: Diagnosis present

## 2020-06-08 DIAGNOSIS — Z90721 Acquired absence of ovaries, unilateral: Secondary | ICD-10-CM | POA: Diagnosis not present

## 2020-06-08 DIAGNOSIS — M4316 Spondylolisthesis, lumbar region: Secondary | ICD-10-CM | POA: Diagnosis present

## 2020-06-08 DIAGNOSIS — Z20822 Contact with and (suspected) exposure to covid-19: Secondary | ICD-10-CM | POA: Diagnosis present

## 2020-06-08 DIAGNOSIS — Z419 Encounter for procedure for purposes other than remedying health state, unspecified: Secondary | ICD-10-CM

## 2020-06-08 DIAGNOSIS — K219 Gastro-esophageal reflux disease without esophagitis: Secondary | ICD-10-CM | POA: Diagnosis present

## 2020-06-08 DIAGNOSIS — M48062 Spinal stenosis, lumbar region with neurogenic claudication: Secondary | ICD-10-CM | POA: Diagnosis present

## 2020-06-08 DIAGNOSIS — Z9049 Acquired absence of other specified parts of digestive tract: Secondary | ICD-10-CM | POA: Diagnosis not present

## 2020-06-08 DIAGNOSIS — D649 Anemia, unspecified: Secondary | ICD-10-CM | POA: Diagnosis not present

## 2020-06-08 LAB — GLUCOSE, CAPILLARY
Glucose-Capillary: 116 mg/dL — ABNORMAL HIGH (ref 70–99)
Glucose-Capillary: 140 mg/dL — ABNORMAL HIGH (ref 70–99)

## 2020-06-08 LAB — ABO/RH: ABO/RH(D): A POS

## 2020-06-08 SURGERY — POSTERIOR LUMBAR FUSION 1 LEVEL
Anesthesia: General

## 2020-06-08 MED ORDER — DEXAMETHASONE SODIUM PHOSPHATE 10 MG/ML IJ SOLN
INTRAMUSCULAR | Status: DC | PRN
  Administered 2020-06-08: 5 mg via INTRAVENOUS

## 2020-06-08 MED ORDER — MORPHINE SULFATE (PF) 4 MG/ML IV SOLN: 4.0000 mg | INTRAVENOUS | Status: DC | PRN

## 2020-06-08 MED ORDER — PHENYLEPHRINE HCL-NACL 10-0.9 MG/250ML-% IV SOLN
INTRAVENOUS | Status: DC | PRN
  Administered 2020-06-08: 25 ug/min via INTRAVENOUS

## 2020-06-08 MED ORDER — CHLORHEXIDINE GLUCONATE CLOTH 2 % EX PADS: 6.0000 | MEDICATED_PAD | Freq: Once | CUTANEOUS | Status: DC

## 2020-06-08 MED ORDER — MENTHOL 3 MG MT LOZG: 1.0000 | LOZENGE | OROMUCOSAL | Status: DC | PRN

## 2020-06-08 MED ORDER — ONDANSETRON HCL 4 MG/2ML IJ SOLN
INTRAMUSCULAR | Status: DC | PRN
  Administered 2020-06-08: 4 mg via INTRAVENOUS

## 2020-06-08 MED ORDER — DOCUSATE SODIUM 100 MG PO CAPS
100.0000 mg | ORAL_CAPSULE | Freq: Two times a day (BID) | ORAL | Status: DC
  Administered 2020-06-08 – 2020-06-11 (×6): 100 mg via ORAL
  Filled 2020-06-08 (×6): qty 1

## 2020-06-08 MED ORDER — CHLORHEXIDINE GLUCONATE 0.12 % MT SOLN: 15.0000 mL | Freq: Once | OROMUCOSAL | Status: AC

## 2020-06-08 MED ORDER — LIDOCAINE 2% (20 MG/ML) 5 ML SYRINGE
INTRAMUSCULAR | Status: DC | PRN
  Administered 2020-06-08: 60 mg via INTRAVENOUS

## 2020-06-08 MED ORDER — PROPOFOL 500 MG/50ML IV EMUL
INTRAVENOUS | Status: DC | PRN
  Administered 2020-06-08: 50 ug/kg/min via INTRAVENOUS

## 2020-06-08 MED ORDER — LACTATED RINGERS IV SOLN: INTRAVENOUS | Status: DC

## 2020-06-08 MED ORDER — ACETAMINOPHEN 500 MG PO TABS
1000.0000 mg | ORAL_TABLET | Freq: Four times a day (QID) | ORAL | Status: AC
  Administered 2020-06-08 – 2020-06-09 (×3): 1000 mg via ORAL
  Filled 2020-06-08 (×3): qty 2

## 2020-06-08 MED ORDER — SUGAMMADEX SODIUM 200 MG/2ML IV SOLN
INTRAVENOUS | Status: DC | PRN
Start: 2020-06-08 — End: 2020-06-08
  Administered 2020-06-08: 200 mg via INTRAVENOUS

## 2020-06-08 MED ORDER — BACITRACIN ZINC 500 UNIT/GM EX OINT
TOPICAL_OINTMENT | CUTANEOUS | Status: AC
  Filled 2020-06-08: qty 28.35

## 2020-06-08 MED ORDER — SODIUM CHLORIDE 0.9 % IV SOLN: 250.0000 mL | INTRAVENOUS | Status: DC

## 2020-06-08 MED ORDER — ONDANSETRON HCL 4 MG PO TABS: 4.0000 mg | ORAL_TABLET | Freq: Four times a day (QID) | ORAL | Status: DC | PRN

## 2020-06-08 MED ORDER — HYDROMORPHONE HCL 2 MG PO TABS
2.0000 mg | ORAL_TABLET | ORAL | Status: DC | PRN
  Administered 2020-06-08 (×2): 2 mg via ORAL
  Administered 2020-06-09: 4 mg via ORAL
  Administered 2020-06-09 – 2020-06-10 (×4): 2 mg via ORAL
  Administered 2020-06-10 (×2): 4 mg via ORAL
  Administered 2020-06-10 – 2020-06-11 (×5): 2 mg via ORAL
  Filled 2020-06-08: qty 2
  Filled 2020-06-08 (×4): qty 1
  Filled 2020-06-08: qty 2
  Filled 2020-06-08 (×3): qty 1
  Filled 2020-06-08: qty 2
  Filled 2020-06-08 (×3): qty 1
  Filled 2020-06-08: qty 2

## 2020-06-08 MED ORDER — CYCLOBENZAPRINE HCL 10 MG PO TABS
ORAL_TABLET | ORAL | Status: AC
  Filled 2020-06-08: qty 1

## 2020-06-08 MED ORDER — FENTANYL CITRATE (PF) 100 MCG/2ML IJ SOLN
25.0000 ug | INTRAMUSCULAR | Status: DC | PRN
  Administered 2020-06-08 (×2): 25 ug via INTRAVENOUS

## 2020-06-08 MED ORDER — SODIUM CHLORIDE 0.9% FLUSH: 3.0000 mL | INTRAVENOUS | Status: DC | PRN

## 2020-06-08 MED ORDER — ACETAMINOPHEN 325 MG PO TABS
650.0000 mg | ORAL_TABLET | ORAL | Status: DC | PRN
  Administered 2020-06-09 – 2020-06-11 (×7): 650 mg via ORAL
  Filled 2020-06-08 (×7): qty 2

## 2020-06-08 MED ORDER — FENTANYL CITRATE (PF) 250 MCG/5ML IJ SOLN
INTRAMUSCULAR | Status: AC
  Filled 2020-06-08: qty 5

## 2020-06-08 MED ORDER — FENTANYL CITRATE (PF) 100 MCG/2ML IJ SOLN
INTRAMUSCULAR | Status: DC | PRN
  Administered 2020-06-08: 100 ug via INTRAVENOUS
  Administered 2020-06-08 (×2): 25 ug via INTRAVENOUS
  Administered 2020-06-08 (×2): 50 ug via INTRAVENOUS

## 2020-06-08 MED ORDER — CEFAZOLIN SODIUM-DEXTROSE 2-4 GM/100ML-% IV SOLN
2.0000 g | INTRAVENOUS | Status: AC
  Administered 2020-06-08: 2 g via INTRAVENOUS

## 2020-06-08 MED ORDER — ACETAMINOPHEN 500 MG PO TABS: 1000.0000 mg | ORAL_TABLET | Freq: Once | ORAL | Status: AC

## 2020-06-08 MED ORDER — BUPIVACAINE LIPOSOME 1.3 % IJ SUSP
INTRAMUSCULAR | Status: DC | PRN
  Administered 2020-06-08: 20 mL

## 2020-06-08 MED ORDER — CEFAZOLIN SODIUM-DEXTROSE 2-4 GM/100ML-% IV SOLN
2.0000 g | Freq: Three times a day (TID) | INTRAVENOUS | Status: AC
  Administered 2020-06-08 – 2020-06-09 (×2): 2 g via INTRAVENOUS
  Filled 2020-06-08 (×2): qty 100

## 2020-06-08 MED ORDER — 0.9 % SODIUM CHLORIDE (POUR BTL) OPTIME
TOPICAL | Status: DC | PRN
  Administered 2020-06-08: 1000 mL

## 2020-06-08 MED ORDER — PHENYLEPHRINE HCL (PRESSORS) 10 MG/ML IV SOLN
INTRAVENOUS | Status: DC | PRN
  Administered 2020-06-08: 120 ug via INTRAVENOUS

## 2020-06-08 MED ORDER — METFORMIN HCL ER 500 MG PO TB24
500.0000 mg | ORAL_TABLET | Freq: Every day | ORAL | Status: DC
  Administered 2020-06-09 – 2020-06-11 (×3): 500 mg via ORAL
  Filled 2020-06-08 (×3): qty 1

## 2020-06-08 MED ORDER — PROPOFOL 10 MG/ML IV BOLUS
INTRAVENOUS | Status: AC
  Filled 2020-06-08: qty 20

## 2020-06-08 MED ORDER — PHENOL 1.4 % MT LIQD: 1.0000 | OROMUCOSAL | Status: DC | PRN

## 2020-06-08 MED ORDER — ORAL CARE MOUTH RINSE: 15.0000 mL | Freq: Once | OROMUCOSAL | Status: AC

## 2020-06-08 MED ORDER — ZOLPIDEM TARTRATE 5 MG PO TABS: 5.0000 mg | ORAL_TABLET | Freq: Every evening | ORAL | Status: DC | PRN

## 2020-06-08 MED ORDER — ROCURONIUM BROMIDE 10 MG/ML (PF) SYRINGE
PREFILLED_SYRINGE | INTRAVENOUS | Status: DC | PRN
  Administered 2020-06-08: 30 mg via INTRAVENOUS
  Administered 2020-06-08: 40 mg via INTRAVENOUS
  Administered 2020-06-08: 60 mg via INTRAVENOUS

## 2020-06-08 MED ORDER — FENTANYL CITRATE (PF) 100 MCG/2ML IJ SOLN
INTRAMUSCULAR | Status: AC
  Filled 2020-06-08: qty 2

## 2020-06-08 MED ORDER — CEFAZOLIN SODIUM-DEXTROSE 2-4 GM/100ML-% IV SOLN
INTRAVENOUS | Status: AC
  Filled 2020-06-08: qty 100

## 2020-06-08 MED ORDER — THROMBIN 5000 UNITS EX SOLR
CUTANEOUS | Status: AC
  Filled 2020-06-08: qty 5000

## 2020-06-08 MED ORDER — ONDANSETRON HCL 4 MG/2ML IJ SOLN: 4.0000 mg | Freq: Once | INTRAMUSCULAR | Status: DC | PRN

## 2020-06-08 MED ORDER — CHLORHEXIDINE GLUCONATE 0.12 % MT SOLN
OROMUCOSAL | Status: AC
  Administered 2020-06-08: 15 mL via OROMUCOSAL
  Filled 2020-06-08: qty 15

## 2020-06-08 MED ORDER — PANTOPRAZOLE SODIUM 40 MG PO TBEC
40.0000 mg | DELAYED_RELEASE_TABLET | Freq: Every day | ORAL | Status: DC
  Administered 2020-06-09 – 2020-06-11 (×3): 40 mg via ORAL
  Filled 2020-06-08: qty 1
  Filled 2020-06-08: qty 2
  Filled 2020-06-08: qty 1

## 2020-06-08 MED ORDER — ACETAMINOPHEN 10 MG/ML IV SOLN
INTRAVENOUS | Status: AC
  Filled 2020-06-08: qty 100

## 2020-06-08 MED ORDER — BUPIVACAINE-EPINEPHRINE 0.5% -1:200000 IJ SOLN
INTRAMUSCULAR | Status: AC
  Filled 2020-06-08: qty 1

## 2020-06-08 MED ORDER — SIMVASTATIN 20 MG PO TABS
40.0000 mg | ORAL_TABLET | Freq: Every day | ORAL | Status: DC
  Administered 2020-06-09 – 2020-06-11 (×3): 40 mg via ORAL
  Filled 2020-06-08 (×3): qty 2

## 2020-06-08 MED ORDER — CYCLOBENZAPRINE HCL 10 MG PO TABS
10.0000 mg | ORAL_TABLET | Freq: Three times a day (TID) | ORAL | Status: DC | PRN
  Administered 2020-06-08 – 2020-06-10 (×4): 10 mg via ORAL
  Filled 2020-06-08 (×3): qty 1

## 2020-06-08 MED ORDER — ACETAMINOPHEN 500 MG PO TABS
ORAL_TABLET | ORAL | Status: AC
  Administered 2020-06-08: 1000 mg via ORAL
  Filled 2020-06-08: qty 2

## 2020-06-08 MED ORDER — SODIUM CHLORIDE 0.9% FLUSH: 3.0000 mL | Freq: Two times a day (BID) | INTRAVENOUS | Status: DC

## 2020-06-08 MED ORDER — LACTATED RINGERS IV SOLN: INTRAVENOUS | Status: DC | PRN

## 2020-06-08 MED ORDER — BISACODYL 10 MG RE SUPP: 10.0000 mg | Freq: Every day | RECTAL | Status: DC | PRN

## 2020-06-08 MED ORDER — SODIUM CHLORIDE 0.9 % IV SOLN
INTRAVENOUS | Status: DC | PRN
  Administered 2020-06-08: 500 mL

## 2020-06-08 MED ORDER — BACITRACIN ZINC 500 UNIT/GM EX OINT
TOPICAL_OINTMENT | CUTANEOUS | Status: DC | PRN
  Administered 2020-06-08: 1 via TOPICAL

## 2020-06-08 MED ORDER — BUPIVACAINE-EPINEPHRINE (PF) 0.5% -1:200000 IJ SOLN
INTRAMUSCULAR | Status: DC | PRN
  Administered 2020-06-08: 10 mL via PERINEURAL

## 2020-06-08 MED ORDER — BUPIVACAINE LIPOSOME 1.3 % IJ SUSP
20.0000 mL | Freq: Once | INTRAMUSCULAR | Status: DC
  Filled 2020-06-08: qty 20

## 2020-06-08 MED ORDER — PROPOFOL 10 MG/ML IV BOLUS
INTRAVENOUS | Status: DC | PRN
  Administered 2020-06-08: 130 mg via INTRAVENOUS

## 2020-06-08 MED ORDER — THROMBIN 5000 UNITS EX SOLR
OROMUCOSAL | Status: DC | PRN
  Administered 2020-06-08 (×2): 5 mL via TOPICAL

## 2020-06-08 MED ORDER — ONDANSETRON HCL 4 MG/2ML IJ SOLN: 4.0000 mg | Freq: Four times a day (QID) | INTRAMUSCULAR | Status: DC | PRN

## 2020-06-08 MED ORDER — ACETAMINOPHEN 650 MG RE SUPP: 650.0000 mg | RECTAL | Status: DC | PRN

## 2020-06-08 SURGICAL SUPPLY — 69 items
ADH SKN CLS APL DERMABOND .7 (GAUZE/BANDAGES/DRESSINGS) ×1
APL SKNCLS STERI-STRIP NONHPOA (GAUZE/BANDAGES/DRESSINGS) ×1
BAG DECANTER FOR FLEXI CONT (MISCELLANEOUS) ×3 IMPLANT
BASKET BONE COLLECTION (BASKET) ×3 IMPLANT
BENZOIN TINCTURE PRP APPL 2/3 (GAUZE/BANDAGES/DRESSINGS) ×3 IMPLANT
BLADE CLIPPER SURG (BLADE) IMPLANT
BUR MATCHSTICK NEURO 3.0 LAGG (BURR) ×3 IMPLANT
BUR PRECISION FLUTE 6.0 (BURR) ×3 IMPLANT
CAGE ALTERA 10X31MM-10-14-15 (Cage) ×1 IMPLANT
CAGE ALTERA 10X31X10-14 15D (Cage) ×2 IMPLANT
CANISTER SUCT 3000ML PPV (MISCELLANEOUS) ×3 IMPLANT
CAP LOCK DLX THRD (Cap) ×12 IMPLANT
CARTRIDGE OIL MAESTRO DRILL (MISCELLANEOUS) ×1 IMPLANT
CLOSURE WOUND 1/2 X4 (GAUZE/BANDAGES/DRESSINGS) ×1
CNTNR URN SCR LID CUP LEK RST (MISCELLANEOUS) ×1 IMPLANT
CONT SPEC 4OZ STRL OR WHT (MISCELLANEOUS) ×3
COVER BACK TABLE 60X90IN (DRAPES) ×3 IMPLANT
COVER WAND RF STERILE (DRAPES) ×3 IMPLANT
DECANTER SPIKE VIAL GLASS SM (MISCELLANEOUS) ×3 IMPLANT
DERMABOND ADVANCED (GAUZE/BANDAGES/DRESSINGS) ×2
DERMABOND ADVANCED .7 DNX12 (GAUZE/BANDAGES/DRESSINGS) ×1 IMPLANT
DIFFUSER DRILL AIR PNEUMATIC (MISCELLANEOUS) ×3 IMPLANT
DRAPE C-ARM 42X72 X-RAY (DRAPES) ×3 IMPLANT
DRAPE HALF SHEET 40X57 (DRAPES) ×6 IMPLANT
DRAPE LAPAROTOMY 100X72X124 (DRAPES) ×3 IMPLANT
DRAPE SURG 17X23 STRL (DRAPES) ×12 IMPLANT
ELECT BLADE 4.0 EZ CLEAN MEGAD (MISCELLANEOUS) ×6
ELECT REM PT RETURN 9FT ADLT (ELECTROSURGICAL) ×3
ELECTRODE BLDE 4.0 EZ CLN MEGD (MISCELLANEOUS) ×2 IMPLANT
ELECTRODE REM PT RTRN 9FT ADLT (ELECTROSURGICAL) ×1 IMPLANT
EVACUATOR 1/8 PVC DRAIN (DRAIN) IMPLANT
GAUZE 4X4 16PLY RFD (DISPOSABLE) ×3 IMPLANT
GAUZE SPONGE 4X4 12PLY STRL (GAUZE/BANDAGES/DRESSINGS) ×3 IMPLANT
GLOVE BIO SURGEON STRL SZ8 (GLOVE) ×6 IMPLANT
GLOVE BIO SURGEON STRL SZ8.5 (GLOVE) ×6 IMPLANT
GLOVE EXAM NITRILE XL STR (GLOVE) IMPLANT
GOWN STRL REUS W/ TWL LRG LVL3 (GOWN DISPOSABLE) ×3 IMPLANT
GOWN STRL REUS W/ TWL XL LVL3 (GOWN DISPOSABLE) ×2 IMPLANT
GOWN STRL REUS W/TWL 2XL LVL3 (GOWN DISPOSABLE) IMPLANT
GOWN STRL REUS W/TWL LRG LVL3 (GOWN DISPOSABLE) ×9
GOWN STRL REUS W/TWL XL LVL3 (GOWN DISPOSABLE) ×6
HEMOSTAT POWDER KIT SURGIFOAM (HEMOSTASIS) ×6 IMPLANT
KIT BASIN OR (CUSTOM PROCEDURE TRAY) ×3 IMPLANT
KIT TURNOVER KIT B (KITS) ×3 IMPLANT
MILL MEDIUM DISP (BLADE) IMPLANT
NEEDLE HYPO 21X1.5 SAFETY (NEEDLE) ×3 IMPLANT
NEEDLE HYPO 22GX1.5 SAFETY (NEEDLE) ×3 IMPLANT
NS IRRIG 1000ML POUR BTL (IV SOLUTION) ×3 IMPLANT
OIL CARTRIDGE MAESTRO DRILL (MISCELLANEOUS) ×3
PACK LAMINECTOMY NEURO (CUSTOM PROCEDURE TRAY) ×3 IMPLANT
PAD ARMBOARD 7.5X6 YLW CONV (MISCELLANEOUS) ×9 IMPLANT
PATTIES SURGICAL .5 X1 (DISPOSABLE) IMPLANT
PATTIES SURGICAL 1X1 (DISPOSABLE) ×3 IMPLANT
PUTTY DBM 10CC CALC GRAN (Putty) ×3 IMPLANT
ROD CREO DLX CVD 6.35X40 (Rod) ×2 IMPLANT
ROD CURVED TI 6.35X40 (Rod) ×6 IMPLANT
SCREW PA DLX CREO 7.5X50 (Screw) ×12 IMPLANT
SPONGE LAP 4X18 RFD (DISPOSABLE) IMPLANT
SPONGE NEURO XRAY DETECT 1X3 (DISPOSABLE) ×3 IMPLANT
SPONGE SURGIFOAM ABS GEL 100 (HEMOSTASIS) IMPLANT
STRIP CLOSURE SKIN 1/2X4 (GAUZE/BANDAGES/DRESSINGS) ×2 IMPLANT
SUT VIC AB 1 CT1 18XBRD ANBCTR (SUTURE) ×2 IMPLANT
SUT VIC AB 1 CT1 8-18 (SUTURE) ×6
SUT VIC AB 2-0 CP2 18 (SUTURE) ×6 IMPLANT
SYR 20ML LL LF (SYRINGE) ×3 IMPLANT
TOWEL GREEN STERILE (TOWEL DISPOSABLE) ×3 IMPLANT
TOWEL GREEN STERILE FF (TOWEL DISPOSABLE) ×3 IMPLANT
TRAY FOLEY MTR SLVR 16FR STAT (SET/KITS/TRAYS/PACK) ×3 IMPLANT
WATER STERILE IRR 1000ML POUR (IV SOLUTION) ×3 IMPLANT

## 2020-06-08 NOTE — Op Note (Signed)
Brief history: The patient is a 79 year old white female who has complained of back and right leg pain consistent with neurogenic claudication.  She has failed medical management and was worked up with a lumbar MRI and lumbar x-rays which demonstrated an L4-5 spinal listhesis, synovial cyst, spinal stenosis, etc.  I discussed the various treatment options with her.  She has decided proceed with surgery.  Preoperative diagnosis: L4-5 spondylolisthesis, synovial cyst, degenerative disc disease, spinal stenosis compressing both the L4 and the L5 nerve roots; lumbago; lumbar radiculopathy; neurogenic claudication  Postoperative diagnosis: The same  Procedure: Bilateral L4-5 laminotomy/foraminotomies/medial facetectomy to decompress the bilateral L4 and L5 nerve roots(the work required to do this was in addition to the work required to do the posterior lumbar interbody fusion because of the patient's spinal stenosis, facet arthropathy. Etc. requiring a wide decompression of the nerve roots.);  L4-5 transforaminal lumbar interbody fusion with local morselized autograft bone and Zimmer DBM; insertion of interbody prosthesis at L4-5 (globus peek expandable interbody prosthesis); posterior nonsegmental instrumentation from L4 to L5 with globus titanium pedicle screws and rods; posterior lateral arthrodesis at L4-5 bilaterally with local morselized autograft bone and Zimmer DBM.  Surgeon: Dr. Delma Officer  Asst.: Hildred Priest, NP  Anesthesia: Gen. endotracheal  Estimated blood loss: 200 cc  Drains: None  Complications: None  Description of procedure: The patient was brought to the operating room by the anesthesia team. General endotracheal anesthesia was induced. The patient was turned to the prone position on the Wilson frame. The patient's lumbosacral region was then prepared with Betadine scrub and Betadine solution. Sterile drapes were applied.  I then injected the area to be incised with Marcaine  with epinephrine solution. I then used the scalpel to make a linear midline incision over the L4-5 interspace. I then used electrocautery to perform a bilateral subperiosteal dissection exposing the spinous process and lamina of L4-5. We then obtained intraoperative radiograph to confirm our location. We then inserted the Verstrac retractor to provide exposure.  I began the decompression by using the high speed drill to perform laminotomies at L4-5 bilaterally. We then used the Kerrison punches to widen the laminotomy and removed the ligamentum flavum at L4-5 bilaterally. We used the Kerrison punches to remove the medial facets at L4-5 bilaterally, we removed the right L4-5 facet. We performed wide foraminotomies about the bilateral L4 and L5 nerve roots completing the decompression.  We now turned our attention to the posterior lumbar interbody fusion. I used a scalpel to incise the intervertebral disc at L4-5 bilaterally. I then performed a partial intervertebral discectomy at L4-5 bilaterally using the pituitary forceps. We prepared the vertebral endplates at L4-5 bilaterally for the fusion by removing the soft tissues with the curettes. We then used the trial spacers to pick the appropriate sized interbody prosthesis. We prefilled his prosthesis with a combination of local morselized autograft bone that we obtained during the decompression as well as Zimmer DBM. We inserted the prefilled prosthesis into the interspace at L4-5 from the right, we then turned and expanded the prosthesis. There was a good snug fit of the prosthesis in the interspace. We then filled and the remainder of the intervertebral disc space with local morselized autograft bone and Zimmer DBM. This completed the posterior lumbar interbody arthrodesis.  During the decompression and insertion of the prosthesis the assistant protected the thecal sac and nerve roots with the D'Errico retractor.  We now turned attention to the  instrumentation. Under fluoroscopic guidance we cannulated the  bilateral L4 and L5 pedicles with the bone probe. We then removed the bone probe. We then tapped the pedicle with a 6.5 millimeter tap. We then removed the tap. We probed inside the tapped pedicle with a ball probe to rule out cortical breaches. We then inserted a 7.5 x 50 millimeter pedicle screw into the L4 and L5 pedicles bilaterally under fluoroscopic guidance. We then palpated along the medial aspect of the pedicles to rule out cortical breaches. There were none. The nerve roots were not injured. We then connected the unilateral pedicle screws with a lordotic rod. We compressed the construct and secured the rod in place with the caps. We then tightened the caps appropriately. This completed the instrumentation from L4-5.  We now turned our attention to the posterior lateral arthrodesis at L4-5 bilaterally. We used the high-speed drill to decorticate the remainder of the facets, pars, transverse process at L4-5 bilaterally. We then applied a combination of local morselized autograft bone and Zimmer DBM over these decorticated posterior lateral structures. This completed the posterior lateral arthrodesis.  We then obtained hemostasis using bipolar electrocautery. We irrigated the wound out with bacitracin solution. We inspected the thecal sac and nerve roots and noted they were well decompressed. We then removed the retractor.  We injected Exparel . We reapproximated patient's thoracolumbar fascia with interrupted #1 Vicryl suture. We reapproximated patient's subcutaneous tissue with interrupted 2-0 Vicryl suture. The reapproximated patient's skin with Steri-Strips and benzoin. The wound was then coated with bacitracin ointment. A sterile dressing was applied. The drapes were removed. The patient was subsequently returned to the supine position where they were extubated by the anesthesia team. He was then transported to the post anesthesia care  unit in stable condition. All sponge instrument and needle counts were reportedly correct at the end of this case.

## 2020-06-08 NOTE — Anesthesia Procedure Notes (Signed)
Procedure Name: Intubation Date/Time: 06/08/2020 10:52 AM Performed by: Babs Bertin, CRNA Pre-anesthesia Checklist: Patient identified, Emergency Drugs available, Suction available and Patient being monitored Patient Re-evaluated:Patient Re-evaluated prior to induction Oxygen Delivery Method: Circle System Utilized Preoxygenation: Pre-oxygenation with 100% oxygen Induction Type: IV induction Ventilation: Mask ventilation without difficulty Laryngoscope Size: Mac and 3 Grade View: Grade I Tube type: Oral Tube size: 7.0 mm Number of attempts: 1 Airway Equipment and Method: Stylet and Oral airway Placement Confirmation: ETT inserted through vocal cords under direct vision,  positive ETCO2 and breath sounds checked- equal and bilateral Secured at: 20 cm Tube secured with: Tape Dental Injury: Teeth and Oropharynx as per pre-operative assessment

## 2020-06-08 NOTE — H&P (Signed)
Subjective: The patient is a 79 year old white female who has complained of back and right greater left leg pain consistent with neurogenic claudication.  She has failed medical management and was worked up with lumbar x-rays and lumbar MRI which demonstrated an L4-5 spondylolisthesis and spinal stenosis.  I discussed the various treatment options with her.  She has decided proceed with surgery after weighing the risks, benefits and alternatives.  Past Medical History:  Diagnosis Date  . Abnormal bone density screening   . Diverticulitis   . Hypertension   . Hyperthyroidism   . Hypoglycemia, unspecified   . IBS (irritable bowel syndrome)   . Obesity   . Osteoporosis   . Palpitations   . Post-menopausal   . Spinal stenosis 2017    Past Surgical History:  Procedure Laterality Date  . ABDOMINAL HYSTERECTOMY    . APPENDECTOMY    . EYE SURGERY     bilateral cataract surgery  . OOPHORECTOMY    . orif left elbow    . SHOULDER OPEN ROTATOR CUFF REPAIR    . TONSILLECTOMY      Allergies  Allergen Reactions  . Codeine     REACTION: nausea/vomiting    Social History   Tobacco Use  . Smoking status: Former Smoker    Years: 20.00    Types: Cigarettes    Quit date: 10/22/1998    Years since quitting: 21.6  . Smokeless tobacco: Never Used  Substance Use Topics  . Alcohol use: Yes    Alcohol/week: 2.0 standard drinks    Types: 2 Standard drinks or equivalent per week    Comment: occasionally    Family History  Problem Relation Age of Onset  . COPD Mother   . Hyperlipidemia Mother   . Hypertension Sister   . Hyperlipidemia Sister   . Diabetes Neg Hx   . Heart disease Neg Hx   . Colon cancer Neg Hx   . Rectal cancer Neg Hx   . Stomach cancer Neg Hx   . Esophageal cancer Neg Hx    Prior to Admission medications   Medication Sig Start Date End Date Taking? Authorizing Provider  ascorbic acid (VITAMIN C) 500 MG tablet Take 500 mg by mouth daily.   Yes [provider]   Calcium Carbonate-Vitamin D (CALTRATE 600+D) 600-400 MG-UNIT per tablet Take 1 tablet by mouth 2 (two) times daily.    Yes [provider]  Cholecalciferol (VITAMIN D3) 50 MCG (2000 UT) capsule Take 2,000 Units by mouth daily.    Yes [provider]  ibuprofen (ADVIL) 200 MG tablet Take 200 mg by mouth every 6 (six) hours as needed for moderate pain.   Yes [provider]  MEGARED OMEGA-3 KRILL OIL PO Take 1 capsule by mouth daily.   Yes [provider]  metFORMIN (GLUCOPHAGE-XR) 500 MG 24 hr tablet Take 500 mg by mouth daily with breakfast.    Yes [provider]  Multiple Vitamins-Minerals (CENTRUM SILVER PO) Take 1 tablet by mouth daily.     Yes [provider]  Naproxen Sodium (ALEVE) 220 MG CAPS Take 440 mg by mouth daily as needed (pain,headache).    Yes [provider]  omeprazole (PRILOSEC) 20 MG capsule TAKE 1 CAPSULE(20 MG) BY MOUTH DAILY Patient taking differently: Take 20 mg by mouth daily.  12/14/19  Yes Iva Boop, MD  simvastatin (ZOCOR) 40 MG tablet Take 40 mg by mouth daily. 10/06/16  Yes [provider]  denosumab (PROLIA) 60 MG/ML  SOLN injection Inject 60 mg into the skin every 6 (six) months. Administer in upper arm, thigh, or abdomen    [provider]     Review of Systems  Positive ROS: As above  All other systems have been reviewed and were otherwise negative with the exception of those mentioned in the HPI and as above.  Objective: Vital signs in last 24 hours: Temp:  [98.4 F (36.9 C)] 98.4 F (36.9 C) (08/18 0915) Pulse Rate:  [112] 112 (08/18 0915) Resp:  [20] 20 (08/18 0915) BP: (166-187)/(87-103) 187/87 (08/18 0944) SpO2:  [98 %] 98 % (08/18 0915) Weight:  [69.1 kg] 69.1 kg (08/18 0915) Estimated body mass index is 30.77 kg/m as calculated from the following:   Height as of this encounter: 4\' 11"  (1.499 m).   Weight as of this encounter: 69.1 kg.   General  Appearance: Alert Head: Normocephalic, without obvious abnormality, atraumatic Eyes: PERRL, conjunctiva/corneas clear, EOM's intact,    Ears: Normal  Throat: Normal  Neck: Supple, Back: unremarkable Lungs: Clear to auscultation bilaterally, respirations unlabored Heart: Regular rate and rhythm, no murmur, rub or gallop Abdomen: Soft, non-tender Extremities: Extremities normal, atraumatic, no cyanosis or edema Skin: unremarkable  NEUROLOGIC:   Mental status: alert and oriented,Motor Exam - grossly normal Sensory Exam - grossly normal Reflexes:  Coordination - grossly normal Gait - grossly normal Balance - grossly normal Cranial Nerves: I: smell Not tested  II: visual acuity  OS: Normal  OD: Normal   II: visual fields Full to confrontation  II: pupils Equal, round, reactive to light  III,VII: ptosis None  III,IV,VI: extraocular muscles  Full ROM  V: mastication Normal  V: facial light touch sensation  Normal  V,VII: corneal reflex  Present  VII: facial muscle function - upper  Normal  VII: facial muscle function - lower Normal  VIII: hearing Not tested  IX: soft palate elevation  Normal  IX,X: gag reflex Present  XI: trapezius strength  5/5  XI: sternocleidomastoid strength 5/5  XI: neck flexion strength  5/5  XII: tongue strength  Normal    Data Review Lab Results  Component Value Date   WBC 7.0 06/01/2020   HGB 12.2 06/01/2020   HCT 40.4 06/01/2020   MCV 90.8 06/01/2020   PLT 274 06/01/2020   Lab Results  Component Value Date   NA 138 06/01/2020   K 4.2 06/01/2020   CL 101 06/01/2020   CO2 27 06/01/2020   BUN 18 06/01/2020   CREATININE 0.69 06/01/2020   GLUCOSE 111 (H) 06/01/2020   No results found for: INR, PROTIME  Assessment/Plan: L4-5 spondylolisthesis, spinal stenosis, lumbago, lumbar radiculopathy, neurogenic claudication: I have discussed the situation with the patient.  I reviewed her imaging studies with her and pointed out the abnormalities.   We have discussed the various treatment options including surgery.  I have described the surgical treatment option L4-5 decompression, instrumentation and fusion.  I have shown her surgical models.  I have given her a surgical pamphlet.  We have discussed the risk, benefits, alternatives, expected postoperative course, and likelihood of achieving our goals with surgery.  I have answered all her questions.  She has decided proceed with surgery.   08/01/2020 06/08/2020 10:37 AM

## 2020-06-08 NOTE — Anesthesia Postprocedure Evaluation (Signed)
Anesthesia Post Note  Patient: Tonyetta W [place]  Procedure(s) Performed: POSTERIOR LUMBAR INTERBODY FUSION, POSTERIOR INSTRUMENTATION LUMBAR FOUR- LUMBAR FIVE (N/A )     Patient location during evaluation: PACU Anesthesia Type: General Level of consciousness: awake and alert, awake and oriented Pain management: pain level controlled Vital Signs Assessment: post-procedure vital signs reviewed and stable Respiratory status: spontaneous breathing, nonlabored ventilation and respiratory function stable Cardiovascular status: blood pressure returned to baseline and stable Postop Assessment: no apparent nausea or vomiting Anesthetic complications: no   No complications documented.  Last Vitals:  Vitals:   06/08/20 1530 06/08/20 1535  BP:  110/76  Pulse: (!) 103 99  Resp: 14 13  Temp:    SpO2: 92% 95%    Last Pain:  Vitals:   06/08/20 0921  TempSrc:   PainSc: 3     LLE Motor Response: Purposeful movement (06/08/20 1535)   RLE Motor Response: Purposeful movement (06/08/20 1535)        Cecile Hearing

## 2020-06-08 NOTE — Progress Notes (Signed)
   Providing Compassionate, Quality Care - Together   Subjective: Patient denies numbness, tingling, or weakness. Her pain is well-controlled.  Objective: Vital signs in last 24 hours: Temp:  [97.7 F (36.5 C)-98.4 F (36.9 C)] 97.7 F (36.5 C) (08/18 1535) Pulse Rate:  [96-112] 103 (08/18 1545) Resp:  [11-20] 16 (08/18 1545) BP: (109-187)/(64-103) 110/76 (08/18 1535) SpO2:  [92 %-99 %] 95 % (08/18 1545) Weight:  [69.1 kg] 69.1 kg (08/18 0915)  Intake/Output from previous day: No intake/output data recorded. Intake/Output this shift: Total I/O In: 1800 [I.V.:1700; IV Piggyback:100] Out: 450 [Urine:250; Blood:200]  Alert and oriented x 4 PERRLA CN II-XII grossly intact MAE, Strength and sensation intact Incision is covered with Honeycomb dressing and Steri Strips; Dressing is clean, dry, and intact   Lab Results: No results for input(s): WBC, HGB, HCT, PLT in the last 72 hours. BMET No results for input(s): NA, K, CL, CO2, GLUCOSE, BUN, CREATININE, CALCIUM in the last 72 hours.  Studies/Results: DG Lumbar Spine 2-3 Views  Result Date: 06/08/2020 CLINICAL DATA:  Intraoperative localization images during lumbar spine surgery EXAM: LUMBAR SPINE - 2-3 VIEW COMPARISON:  04/19/2020 FINDINGS: Two cross-table lateral views of the lumbar spine are obtained during the performance of procedure. The initial image demonstrates instrumentation posterior to the L5/S1 disc space. The second image demonstrates instrumentation posterior to the L3/L4 disc space. There is diffuse lumbar spondylosis most pronounced at L1-2, L2-3, L3-4, and L5-S1. Prominent facet hypertrophy is seen from L3 through S1. IMPRESSION: 1. Intraoperative examination as above, with diffuse multilevel spondylosis and surgical localization. Electronically Signed   By: Sharlet Salina M.D.   On: 06/08/2020 15:30    Assessment/Plan: Patient is recovering in the PACU following L4-5 PLIF by Dr. Lovell Sheehan.   LOS: 0 days     -Pt being admitted to [facility] -Mobilize with therapies   Val Eagle, DNP, AGNP-C Nurse Practitioner  [facility] [address], [address], [place], [place] [address] P: [phone]    F: [phone]  06/08/2020, 3:50 PM

## 2020-06-08 NOTE — Transfer of Care (Signed)
Immediate Anesthesia Transfer of Care Note  Patient: Vanessa Boyd  Procedure(s) Performed: POSTERIOR LUMBAR INTERBODY FUSION, POSTERIOR INSTRUMENTATION LUMBAR FOUR- LUMBAR FIVE (N/A )  Patient Location: PACU  Anesthesia Type:General  Level of Consciousness: awake, alert  and oriented  Airway & Oxygen Therapy: Patient Spontanous Breathing and Patient connected to face mask oxygen  Post-op Assessment: Report given to RN, Post -op Vital signs reviewed and stable and Patient moving all extremities X 4  Post vital signs: Reviewed and stable  Last Vitals:  Vitals Value Taken Time  BP 152/85 06/08/20 1503  Temp    Pulse 114 06/08/20 1503  Resp 12 06/08/20 1503  SpO2 100 % 06/08/20 1503  Vitals shown include unvalidated device data.  Last Pain:  Vitals:   06/08/20 0921  TempSrc:   PainSc: 3       Patients Stated Pain Goal: 3 (06/08/20 0921)  Complications: No complications documented.

## 2020-06-09 LAB — BASIC METABOLIC PANEL
Anion gap: 7 (ref 5–15)
BUN: 13 mg/dL (ref 8–23)
CO2: 26 mmol/L (ref 22–32)
Calcium: 8.6 mg/dL — ABNORMAL LOW (ref 8.9–10.3)
Chloride: 102 mmol/L (ref 98–111)
Creatinine, Ser: 0.83 mg/dL (ref 0.44–1.00)
GFR calc Af Amer: >60 mL/min (ref >60)
GFR calc non Af Amer: >60 mL/min (ref >60)
Glucose, Bld: 131 mg/dL — ABNORMAL HIGH (ref 70–99)
Potassium: 3.6 mmol/L (ref 3.5–5.1)
Sodium: 135 mmol/L (ref 135–145)

## 2020-06-09 LAB — CBC
HCT: 27.9 % — ABNORMAL LOW (ref 36.0–46.0)
Hemoglobin: 8.3 g/dL — ABNORMAL LOW (ref 12.0–15.0)
MCH: 26.8 pg (ref 26.0–34.0)
MCHC: 29.7 g/dL — ABNORMAL LOW (ref 30.0–36.0)
MCV: 90 fL (ref 80.0–100.0)
Platelets: 184 10*3/uL (ref 150–400)
RBC: 3.1 MIL/uL — ABNORMAL LOW (ref 3.87–5.11)
RDW: 15.7 % — ABNORMAL HIGH (ref 11.5–15.5)
WBC: 8.2 10*3/uL (ref 4.0–10.5)
nRBC: 0 % (ref 0.0–0.2)

## 2020-06-09 NOTE — Evaluation (Signed)
Occupational Therapy Evaluation Patient Details Name: Vanessa Boyd MRN: [id number] DOB: 1941/06/19 Today's Date: 06/09/2020    History of Present Illness 79 y.o s/p L4-5 PLIF. PMH includes HTN, IBS, osteoporosis, RC repair, ORIF L elbow   Clinical Impression   PTA pt living alone and functioning at independent level. At time of eval, pt presents with ability to complete sit <> stands at supervision with RW. Pt was able to initially attempt mobility without RW, but balance is much improved with added stability of DME. Back handout provided and reviewed adls in detail. Pt educated on: clothing between brace, never sleep in brace, set an alarm at night for medication, avoid sitting for long periods of time, correct bed positioning for sleeping, correct sequence for bed mobility, avoiding lifting more than 5 pounds and never wash directly over incision. Pt continues to requires min A for LB dressing, will review AE options tomorrow prior to d/c. No post acute OT follow up needed.    Follow Up Recommendations  Supervision - Intermittent    Equipment Recommendations  3 in 1 bedside commode    Recommendations for Other Services       Precautions / Restrictions Precautions Precautions: Fall;Back Precaution Booklet Issued: Yes (comment) Precaution Comments: reviewed in context of BADL Required Braces or Orthoses: Spinal Brace Spinal Brace: Lumbar corset;Applied in sitting position Restrictions Weight Bearing Restrictions: No      Mobility Bed Mobility               General bed mobility comments: sitting EOB  Transfers Overall transfer level: Needs assistance Equipment used: Rolling walker (2 wheeled) Transfers: Sit to/from Stand Sit to Stand: Supervision         General transfer comment: assist for safety and cues for safe hand placement on RW    Balance Overall balance assessment: Mild deficits observed, not formally tested                                          ADL either performed or assessed with clinical judgement   ADL Overall ADL's : Needs assistance/impaired Eating/Feeding: Independent;Sitting   Grooming: Supervision/safety;Standing   Upper Body Bathing: Supervision/ safety;Sitting   Lower Body Bathing: Minimal assistance;Sit to/from stand;Sitting/lateral leans;Cueing for back precautions   Upper Body Dressing : Supervision/safety;Sitting   Lower Body Dressing: Minimal assistance;Sit to/from stand;Sitting/lateral leans   Toilet Transfer: Adult nurse- Clothing Manipulation and Hygiene: Supervision/safety;Cueing for back precautions;Sit to/from stand   Tub/ Shower Transfer: Min guard;Ambulation;Shower seat;Rolling walker   Functional mobility during ADLs: Min guard;Rolling walker       Vision Baseline Vision/History: Wears glasses Wears Glasses: Reading only Patient Visual Report: No change from baseline       Perception     Praxis      Pertinent Vitals/Pain Pain Assessment: Faces Faces Pain Scale: Hurts little more Pain Location: lower back Pain Descriptors / Indicators: Aching;Sore Pain Intervention(s): Monitored during session;Repositioned     Hand Dominance     Extremity/Trunk Assessment Upper Extremity Assessment Upper Extremity Assessment: Overall WFL for tasks assessed   Lower Extremity Assessment Lower Extremity Assessment: Defer to PT evaluation       Communication Communication Communication: No difficulties   Cognition Arousal/Alertness: Awake/alert Behavior During Therapy: WFL for tasks assessed/performed Overall Cognitive Status: Within Functional Limits for tasks assessed  General Comments       Exercises     Shoulder Instructions      Home Living Family/patient expects to be discharged to:: Private residence Living Arrangements: Alone Available Help at Discharge: Family Type of  Home: House Home Access: Stairs to enter Secretary/administrator of Steps: 1   Home Layout: Two level;1/2 bath on main level;Bed/bath upstairs Alternate Level Stairs-Number of Steps: full flight Alternate Level Stairs-Rails: Left Bathroom Shower/Tub: Chief Strategy Officer: Handicapped height     Home Equipment: None          Prior Functioning/Environment Level of Independence: Independent                 OT Problem List: Decreased knowledge of use of DME or AE;Decreased knowledge of precautions;Decreased activity tolerance;Pain      OT Treatment/Interventions:      OT Goals(Current goals can be found in the care plan section) Acute Rehab OT Goals Patient Stated Goal: return to independence OT Goal Formulation: With patient Time For Goal Achievement: 06/23/20 Potential to Achieve Goals: Good  OT Frequency: Min 2X/week   Barriers to D/C:            Co-evaluation              AM-PAC OT "6 Clicks" Daily Activity     Outcome Measure Help from another person eating meals?: None Help from another person taking care of personal grooming?: None Help from another person toileting, which includes using toliet, bedpan, or urinal?: A Little Help from another person bathing (including washing, rinsing, drying)?: A Little Help from another person to put on and taking off regular upper body clothing?: None Help from another person to put on and taking off regular lower body clothing?: A Little 6 Click Score: 21   End of Session Equipment Utilized During Treatment: Rolling walker;Back brace Nurse Communication: Mobility status;Precautions  Activity Tolerance: Patient tolerated treatment well Patient left: Other (comment) (passed off to PT in hallway)  OT Visit Diagnosis: Unsteadiness on feet (R26.81);Other abnormalities of gait and mobility (R26.89);Pain Pain - part of body:  (back)                Time: 0938-1829 OT Time Calculation (min): 29  min Charges:  OT General Charges $OT Visit: 1 Visit OT Evaluation $OT Eval Low Complexity: 1 Low OT Treatments $Self Care/Home Management : 8-22 mins  Dalphine Handing, MSOT, OTR/L Acute Rehabilitation Services [facility] Office Number: [phone] Pager: [phone]  Dalphine Handing 06/09/2020, 10:33 AM

## 2020-06-09 NOTE — Evaluation (Signed)
Physical Therapy Evaluation and Discharge Patient Details Name: Vanessa Boyd MRN: [id number] DOB: 12-30-1940 Today's Date: 06/09/2020   History of Present Illness  79 y.o s/p L4-5 PLIF. PMH includes HTN, IBS, osteoporosis, RC repair, ORIF L elbow  Clinical Impression  Patient evaluated by Physical Therapy with no further acute PT needs identified. All education has been completed and the patient has no further questions. Pt was able to demonstrate transfers and ambulation with gross modified independence and RW for support. Pt was educated on precautions, brace application/wearing schedule, appropriate activity progression, and car transfer. See below for any follow-up Physical Therapy or equipment needs. PT is signing off. Thank you for this referral.     Follow Up Recommendations Home health PT;Supervision for mobility/OOB    Equipment Recommendations  Rolling walker with 5" wheels;3in1 (PT)    Recommendations for Other Services       Precautions / Restrictions Precautions Precautions: Fall;Back Precaution Booklet Issued: Yes (comment) Precaution Comments: Reviewed precautions during functional mobility.  Required Braces or Orthoses: Spinal Brace Spinal Brace: Lumbar corset;Applied in sitting position Restrictions Weight Bearing Restrictions: No      Mobility  Bed Mobility               General bed mobility comments: Pt was received in the hall with OT.  Transfers Overall transfer level: Modified independent Equipment used: Rolling walker (2 wheeled) Transfers: Sit to/from Stand Sit to Stand: Supervision         General transfer comment: Pt demonstrated proper hand placement on seated surface for safety.   Ambulation/Gait Ambulation/Gait assistance: Modified independent (Device/Increase time) Gait Distance (Feet): 250 Feet Assistive device: Rolling walker (2 wheeled) Gait Pattern/deviations: Step-through pattern;Decreased stride length;Trunk flexed Gait  velocity: Decreased Gait velocity interpretation: <1.31 ft/sec, indicative of household ambulator General Gait Details: Pt with good posture and use of walker. No unsteadiness noted.   Stairs Stairs: Yes Stairs assistance: Supervision Stair Management: Step to pattern;Forwards;One rail Right Number of Stairs: 10 General stair comments: VC's for sequencing and general safety. No assist required.   Wheelchair Mobility    Modified Rankin (Stroke Patients Only)       Balance Overall balance assessment: Mild deficits observed, not formally tested                                           Pertinent Vitals/Pain Pain Assessment: Faces Faces Pain Scale: Hurts little more Pain Location: lower back Pain Descriptors / Indicators: Aching;Sore Pain Intervention(s): Limited activity within patient's tolerance;Monitored during session;Repositioned    Home Living Family/patient expects to be discharged to:: Private residence Living Arrangements: Alone Available Help at Discharge: Family Type of Home: House Home Access: Stairs to enter   Secretary/administrator of Steps: 1 Home Layout: Two level;1/2 bath on main level;Bed/bath upstairs Home Equipment: None      Prior Function Level of Independence: Independent               Hand Dominance        Extremity/Trunk Assessment   Upper Extremity Assessment Upper Extremity Assessment: Defer to OT evaluation    Lower Extremity Assessment Lower Extremity Assessment: Overall WFL for tasks assessed (Mild weakness consistent with pre-op diagnosis)    Cervical / Trunk Assessment Cervical / Trunk Assessment: Other exceptions Cervical / Trunk Exceptions: s/p surgery  Communication   Communication: No difficulties  Cognition Arousal/Alertness: Awake/alert Behavior  During Therapy: WFL for tasks assessed/performed Overall Cognitive Status: Within Functional Limits for tasks assessed                                         General Comments      Exercises     Assessment/Plan    PT Assessment Patent does not need any further PT services  PT Problem List         PT Treatment Interventions      PT Goals (Current goals can be found in the Care Plan section)  Acute Rehab PT Goals Patient Stated Goal: return to independence PT Goal Formulation: All assessment and education complete, DC therapy    Frequency     Barriers to discharge        Co-evaluation               AM-PAC PT "6 Clicks" Mobility  Outcome Measure Help needed turning from your back to your side while in a flat bed without using bedrails?: None Help needed moving from lying on your back to sitting on the side of a flat bed without using bedrails?: None Help needed moving to and from a bed to a chair (including a wheelchair)?: None Help needed standing up from a chair using your arms (e.g., wheelchair or bedside chair)?: None Help needed to walk in hospital room?: None Help needed climbing 3-5 steps with a railing? : None 6 Click Score: 24    End of Session Equipment Utilized During Treatment: Gait belt;Back brace Activity Tolerance: Patient tolerated treatment well Patient left: in chair;with call bell/phone within reach Nurse Communication: Mobility status PT Visit Diagnosis: Unsteadiness on feet (R26.81);Pain Pain - part of body:  (back)    Time: 5997-7414 PT Time Calculation (min) (ACUTE ONLY): 23 min   Charges:   PT Evaluation $PT Eval Low Complexity: 1 Low PT Treatments $Gait Training: 8-22 mins        Conni Slipper, PT, DPT Acute Rehabilitation Services Pager: [phone] Office: [phone]   Marylynn Pearson 06/09/2020, 1:15 PM

## 2020-06-09 NOTE — Progress Notes (Signed)
   Providing Compassionate, Quality Care - Together   Subjective: Patient reports her pain is well-controlled. She is requesting one more day to make arrangements for help at home as she is doing too well for CIR.  Objective: Vital signs in last 24 hours: Temp:  [97.7 F (36.5 C)-98.5 F (36.9 C)] 98 F (36.7 C) (08/19 0822) Pulse Rate:  [79-112] 96 (08/19 0822) Resp:  [11-20] 18 (08/19 0822) BP: (109-187)/(53-103) 118/53 (08/19 0822) SpO2:  [92 %-100 %] 97 % (08/19 0822) Weight:  [69.1 kg] 69.1 kg (08/18 0915)  Intake/Output from previous day: 08/18 0701 - 08/19 0700 In: 1900 [I.V.:1700; IV Piggyback:200] Out: 450 [Urine:250; Blood:200] Intake/Output this shift: No intake/output data recorded.  Alert and oriented x 4 PERRLA CN II-XII grossly intact MAE, Strength and sensation intact Incision is covered with Honeycomb dressing and Steri Strips; Dressing is dry and intact; Small amount of dried sanguinous drainage   Lab Results: No results for input(s): WBC, HGB, HCT, PLT in the last 72 hours. BMET No results for input(s): NA, K, CL, CO2, GLUCOSE, BUN, CREATININE, CALCIUM in the last 72 hours.  Studies/Results: DG Lumbar Spine 2-3 Views  Result Date: 06/08/2020 CLINICAL DATA:  PLIF of L4/L5 EXAM: LUMBAR SPINE - 2-3 VIEW; DG C-ARM 1-60 MIN COMPARISON:  None. FINDINGS: One fluoroscopic images obtained during the performance of the procedure and is provided for interpretation only. Postsurgical changes are seen from L4/L5 discectomy, with posterior fusion hardware seen within the L4-L5 vertebral bodies. Alignment is anatomic. FLUOROSCOPY TIME:  22 seconds IMPRESSION: 1. Intraoperative exam as above. Electronically Signed   By: Sharlet Salina M.D.   On: 06/08/2020 17:05   DG Lumbar Spine 2-3 Views  Result Date: 06/08/2020 CLINICAL DATA:  Intraoperative localization images during lumbar spine surgery EXAM: LUMBAR SPINE - 2-3 VIEW COMPARISON:  04/19/2020 FINDINGS: Two  cross-table lateral views of the lumbar spine are obtained during the performance of procedure. The initial image demonstrates instrumentation posterior to the L5/S1 disc space. The second image demonstrates instrumentation posterior to the L3/L4 disc space. There is diffuse lumbar spondylosis most pronounced at L1-2, L2-3, L3-4, and L5-S1. Prominent facet hypertrophy is seen from L3 through S1. IMPRESSION: 1. Intraoperative examination as above, with diffuse multilevel spondylosis and surgical localization. Electronically Signed   By: Sharlet Salina M.D.   On: 06/08/2020 15:30   DG C-Arm 1-60 Min  Result Date: 06/08/2020 CLINICAL DATA:  PLIF of L4/L5 EXAM: LUMBAR SPINE - 2-3 VIEW; DG C-ARM 1-60 MIN COMPARISON:  None. FINDINGS: One fluoroscopic images obtained during the performance of the procedure and is provided for interpretation only. Postsurgical changes are seen from L4/L5 discectomy, with posterior fusion hardware seen within the L4-L5 vertebral bodies. Alignment is anatomic. FLUOROSCOPY TIME:  22 seconds IMPRESSION: 1. Intraoperative exam as above. Electronically Signed   By: Sharlet Salina M.D.   On: 06/08/2020 17:05    Assessment/Plan: Patient is one day status post L4-5 PLIF by Dr. Lovell Sheehan. She is mobilizing with therapies.   LOS: 1 day    -Continue to mobilize -Plan for discharge home tomorrow   Val Eagle, DNP, AGNP-C Nurse Practitioner  [facility] [address], [address], [place], [place] [address] P: [phone]    F: [phone]  06/09/2020, 8:55 AM

## 2020-06-10 MED ORDER — DEXAMETHASONE 4 MG PO TABS
4.0000 mg | ORAL_TABLET | Freq: Four times a day (QID) | ORAL | Status: AC
  Administered 2020-06-10 (×3): 4 mg via ORAL
  Filled 2020-06-10 (×3): qty 1

## 2020-06-10 MED ORDER — FERROUS SULFATE 325 (65 FE) MG PO TABS
325.0000 mg | ORAL_TABLET | Freq: Two times a day (BID) | ORAL | Status: DC
  Administered 2020-06-10 – 2020-06-11 (×3): 325 mg via ORAL
  Filled 2020-06-10 (×3): qty 1

## 2020-06-10 MED FILL — Heparin Sodium (Porcine) Inj 1000 Unit/ML: INTRAMUSCULAR | Qty: 30 | Status: AC

## 2020-06-10 MED FILL — Sodium Chloride IV Soln 0.9%: INTRAVENOUS | Qty: 1000 | Status: AC

## 2020-06-10 NOTE — Progress Notes (Signed)
Subjective:  The patient is alert and pleasant.  She is much more sore than yesterday.  She wants to go home tomorrow.  Objective: Vital signs in last 24 hours: Temp:  [98 F (36.7 C)-100.7 F (38.2 C)] 99.2 F (37.3 C) (08/20 0715) Pulse Rate:  [95-114] 114 (08/20 0715) Resp:  [18] 18 (08/20 0715) BP: (112-150)/(53-78) 116/65 (08/20 0715) SpO2:  [94 %-100 %] 95 % (08/20 0715) Estimated body mass index is 30.77 kg/m as calculated from the following:   Height as of this encounter: 4\' 11"  (1.499 m).   Weight as of this encounter: 69.1 kg.   Intake/Output from previous day: No intake/output data recorded. Intake/Output this shift: No intake/output data recorded.  Physical exam   The patient is alert and pleasant.  Her dressing is clean and dry.  Her lower extremity strength is normal.  Lab Results: Recent Labs   06/09/20 1050 WBC 8.2 HGB 8.3* HCT 27.9* PLT 184  BMET Recent Labs   06/09/20 1050 NA 135 K 3.6 CL 102 CO2 26 GLUCOSE 131* BUN 13 CREATININE 0.83 CALCIUM 8.6*   Studies/Results: DG Lumbar Spine 2-3 Views  Result Date: 06/08/2020 CLINICAL DATA:  PLIF of L4/L5 EXAM: LUMBAR SPINE - 2-3 VIEW; DG C-ARM 1-60 MIN COMPARISON:  None. FINDINGS: One fluoroscopic images obtained during the performance of the procedure and is provided for interpretation only. Postsurgical changes are seen from L4/L5 discectomy, with posterior fusion hardware seen within the L4-L5 vertebral bodies. Alignment is anatomic. FLUOROSCOPY TIME:  22 seconds IMPRESSION: 1. Intraoperative exam as above. Electronically Signed   By: 06/10/2020 M.D.   On: 06/08/2020 17:05   DG Lumbar Spine 2-3 Views  Result Date: 06/08/2020 CLINICAL DATA:  Intraoperative localization images during lumbar spine surgery EXAM: LUMBAR SPINE - 2-3 VIEW COMPARISON:  04/19/2020 FINDINGS: Two cross-table lateral views of the lumbar spine are obtained during the performance of procedure. The initial image demonstrates  instrumentation posterior to the L5/S1 disc space. The second image demonstrates instrumentation posterior to the L3/L4 disc space. There is diffuse lumbar spondylosis most pronounced at L1-2, L2-3, L3-4, and L5-S1. Prominent facet hypertrophy is seen from L3 through S1. IMPRESSION: 1. Intraoperative examination as above, with diffuse multilevel spondylosis and surgical localization. Electronically Signed   By: 04/21/2020 M.D.   On: 06/08/2020 15:30   DG C-Arm 1-60 Min  Result Date: 06/08/2020 CLINICAL DATA:  PLIF of L4/L5 EXAM: LUMBAR SPINE - 2-3 VIEW; DG C-ARM 1-60 MIN COMPARISON:  None. FINDINGS: One fluoroscopic images obtained during the performance of the procedure and is provided for interpretation only. Postsurgical changes are seen from L4/L5 discectomy, with posterior fusion hardware seen within the L4-L5 vertebral bodies. Alignment is anatomic. FLUOROSCOPY TIME:  22 seconds IMPRESSION: 1. Intraoperative exam as above. Electronically Signed   By: 06/10/2020 M.D.   On: 06/08/2020 17:05    Assessment/Plan:   Postop day 2.:  The patient is more sore today.  I will add a little Decadron.  She will go home tomorrow.  Anemia: I will add iron.  LOS: 2 days     06/10/2020 06/10/2020, 8:04 AM

## 2020-06-10 NOTE — TOC Transition Note (Signed)
Transition of Care [facility]) - CM/SW Discharge Note   Patient Details  Name: Vanessa Boyd MRN: [id number] Date of Birth: [birth year]  Transition of Care [facility]) CM/SW Contact:  Beckie Busing, RN Phone Number: [phone]  06/10/2020, 8:54 AM   Clinical Narrative:  CM consulted for [facility] PT. CM offered patient choice. HHPT has been set up with [facility]. Info has been added to avs. Frances Furbish will notify patient for start of service dates. CM will sign off.     Final next level of care: Home w Home Health Services Barriers to Discharge: No Barriers Identified   Patient Goals and CMS Choice Patient states their goals for this hospitalization and ongoing recovery are:: Ready to go home CMS Medicare.gov Compare Post Acute Care list provided to:: Patient Choice offered to / list presented to : Patient  Discharge Placement                       Discharge Plan and Services                DME Arranged: N/A (DME arranged by unit)         HH Arranged: PT HH Agency: [facility] Date [facility] Agency Contacted: 06/10/20 Time HH Agency Contacted: [phone] Representative spoke with at [facility] Agency: Kandee Keen  Social Determinants of Health (SDOH) Interventions     Readmission Risk Interventions No flowsheet data found.

## 2020-06-10 NOTE — Progress Notes (Signed)
Occupational Therapy Treatment Patient Details Name: Vanessa Boyd MRN: [id number] DOB: 02/11/1941 Today's Date: 06/10/2020    History of present illness 79 y.o s/p L4-5 PLIF. PMH includes HTN, IBS, osteoporosis, RC repair, ORIF L elbow   OT comments  Pt seen for OT Follow up to focus on AE education and safe d/c planning. Reviewed use of long handled reacher for LB Dressing, toilet aide, and 3:1 in shower. Pt was also inquiring about bed rails on bed, showed her options she could purchase from the internet- also has a lending closet through her church. Updated recs to [facility] considering pt lives alone and would benefit from increased OT support in home environment. OT will sign of and defer remainder of care to [place]. Thank you for this consult.   Follow Up Recommendations  Home health OT    Equipment Recommendations  3 in 1 bedside commode    Recommendations for Other Services      Precautions / Restrictions Precautions Precautions: Fall;Back Precaution Booklet Issued: Yes (comment) Precaution Comments: reviewed in context of BADL Required Braces or Orthoses: Spinal Brace Spinal Brace: Lumbar corset;Applied in sitting position Restrictions Weight Bearing Restrictions: No       Mobility Bed Mobility Overal bed mobility: Needs Assistance Bed Mobility: Sidelying to Sit;Sit to Sidelying     Supine to sit: Min assist   Sit to sidelying: Min assist General bed mobility comments: assist at trunk to sit up and legs back onto bed due to increased pain this date  Transfers Overall transfer level: Needs assistance Equipment used: Rolling walker (2 wheeled);None Transfers: Sit to/from Stand Sit to Stand: Supervision         General transfer comment: used both RW and without    Balance Overall balance assessment: Mild deficits observed, not formally tested                                         ADL either performed or assessed with clinical judgement    ADL Overall ADL's : Needs assistance/impaired                     Lower Body Dressing: Supervision/safety;Sitting/lateral leans;Sit to/from stand;With adaptive equipment Lower Body Dressing Details (indicate cue type and reason): educated pt on use of long handled reacher with LB dressing. Pt simulated Toilet Transfer: Supervision/safety;Regular Toilet;Grab bars Toilet Transfer Details (indicate cue type and reason): increased time and effort due to pain this date Toileting- Clothing Manipulation and Hygiene: Supervision/safety;Sitting/lateral lean;Sit to/from stand;Adhering to back precautions Toileting - Clothing Manipulation Details (indicate cue type and reason): educated pt on use of toilet aide and how to acquire for home     Functional mobility during ADLs: Supervision/safety;Rolling walker General ADL Comments: reviewed AE usage for home considering pt lives alone to maximize independence     Vision Baseline Vision/History: Wears glasses Wears Glasses: Reading only Patient Visual Report: No change from baseline     Perception     Praxis      Cognition Arousal/Alertness: Awake/alert Behavior During Therapy: WFL for tasks assessed/performed Overall Cognitive Status: Within Functional Limits for tasks assessed                                          Exercises     Shoulder  Instructions       General Comments      Pertinent Vitals/ Pain       Pain Assessment: Faces Faces Pain Scale: Hurts even more Pain Location: lower back Pain Descriptors / Indicators: Aching;Sore Pain Intervention(s): Limited activity within patient's tolerance;Monitored during session;Repositioned  Home Living                                          Prior Functioning/Environment              Frequency  Min 2X/week        Progress Toward Goals  OT Goals(current goals can now be found in the care plan section)  Progress towards OT  goals: Progressing toward goals  Acute Rehab OT Goals Patient Stated Goal: return to independence OT Goal Formulation: All assessment and education complete, DC therapy  Plan Discharge plan needs to be updated    Co-evaluation                 AM-PAC OT "6 Clicks" Daily Activity     Outcome Measure   Help from another person eating meals?: None Help from another person taking care of personal grooming?: None Help from another person toileting, which includes using toliet, bedpan, or urinal?: A Little Help from another person bathing (including washing, rinsing, drying)?: A Little Help from another person to put on and taking off regular upper body clothing?: None Help from another person to put on and taking off regular lower body clothing?: A Little 6 Click Score: 21    End of Session Equipment Utilized During Treatment: Rolling walker;Back brace  OT Visit Diagnosis: Unsteadiness on feet (R26.81);Other abnormalities of gait and mobility (R26.89);Pain Pain - part of body:  (back)   Activity Tolerance Patient tolerated treatment well   Patient Left in bed;with call bell/phone within reach   Nurse Communication Mobility status;Precautions        Time: 8657-8469 OT Time Calculation (min): 17 min  Charges: OT General Charges $OT Visit: 1 Visit OT Treatments $Self Care/Home Management : 8-22 mins  Dalphine Handing, MSOT, OTR/L Acute Rehabilitation Services [facility] Office Number: [phone] Pager: [phone]  Dalphine Handing 06/10/2020, 9:12 AM

## 2020-06-11 MED ORDER — DOCUSATE SODIUM 100 MG PO CAPS: 100.0000 mg | ORAL_CAPSULE | Freq: Every day | ORAL | 0 refills | Status: DC | PRN

## 2020-06-11 MED ORDER — CYCLOBENZAPRINE HCL 10 MG PO TABS: 10.0000 mg | ORAL_TABLET | Freq: Three times a day (TID) | ORAL | 0 refills | Status: DC | PRN

## 2020-06-11 MED ORDER — HYDROMORPHONE HCL 2 MG PO TABS: 2.0000 mg | ORAL_TABLET | ORAL | 0 refills | Status: DC | PRN

## 2020-06-11 NOTE — Discharge Summary (Signed)
Physician Discharge Summary  Patient ID: Vanessa Boyd MRN: [id number] DOB/AGE: [birth year] 77 y.o.  Admit date: 06/08/2020 Discharge date: 06/11/2020  Admission Diagnoses:  L4-5 spondylolisthesis, synovial cyst, degenerative disc disease, spinal stenosis compressing both the L4 and the L5 nerve roots; lumbago; lumbar radiculopathy; neurogenic claudication    Discharge Diagnoses:  Same Active Problems:   Spondylolisthesis of lumbar region   Discharged Condition: Stable  Hospital Course:  Vanessa Boyd is a 79 y.o. female who underwent the below surgery, tolerated it well and was eval by PT and rec for [facility] That was setup. Her pain was controlled on oral medication and she was having normal bowel and bladder function as well as at her baseline neurologic function. Her incision is healing well.  Treatments: Surgery - Bilateral L4-5 laminotomy/foraminotomies/medial facetectomy to decompress the bilateral L4 and L5 nerve roots(the work required to do this was in addition to the work required to do the posterior lumbar interbody fusion because of the patient's spinal stenosis, facet arthropathy. Etc. requiring a wide decompression of the nerve roots.);  L4-5 transforaminal lumbar interbody fusion with local morselized autograft bone and Zimmer DBM; insertion of interbody prosthesis at L4-5 (globus peek expandable interbody prosthesis); posterior nonsegmental instrumentation from L4 to L5 with globus titanium pedicle screws and rods; posterior lateral arthrodesis at L4-5 bilaterally with local morselized autograft bone and Zimmer DBM.  Discharge Exam: Blood pressure 131/74, pulse (!) 104, temperature 98 F (36.7 C), temperature source Oral, resp. rate 18, height 4\' 11"  (1.499 m), weight 69.1 kg, SpO2 100 %. Awake, alert, oriented Speech fluent, appropriate CN grossly intact 5/5 BUE/BLE Wound c/d/i  Disposition: Discharge disposition: 06-Home-Health Care Svc       Discharge  Instructions    Face-to-face encounter (required for Medicare/Medicaid patients)   Complete by: As directed    I certify that this patient is under my care and that I, or a nurse practitioner or physician's assistant working with me, had a face-to-face encounter that meets the physician face-to-face encounter requirements with this patient on 06/10/2020. The encounter with the patient was in whole, or in part for the following medical condition(s) which is the primary reason for home health care (List medical condition): L4-5 spondylolisthesis, Status post L4-5 PLIF   The encounter with the patient was in whole, or in part, for the following medical condition, which is the primary reason for home health care: L4-5 spondylolisthesis, Status post L4-5 PLIF   I certify that, based on my findings, the following services are medically necessary home health services: Physical therapy   Reason for Medically Necessary Home Health Services: Therapy- Therapeutic Exercises to Increase Strength and Endurance   My clinical findings support the need for the above services: Unable to leave home safely without assistance and/or assistive device   Further, I certify that my clinical findings support that this patient is homebound due to: Unable to leave home safely without assistance   Home Health   Complete by: As directed    To provide the following care/treatments: PT     Allergies as of 06/11/2020      Reactions   Codeine    REACTION: nausea/vomiting      Medication List    STOP taking these medications   ibuprofen 200 MG tablet Commonly known as: ADVIL   MEGARED OMEGA-3 KRILL OIL PO     TAKE these medications   Aleve 220 MG Caps Generic drug: Naproxen Sodium Take 440 mg by mouth daily  as needed (pain,headache).   ascorbic acid 500 MG tablet Commonly known as: VITAMIN C Take 500 mg by mouth daily.   Caltrate 600+D 600-400 MG-UNIT tablet Generic drug: Calcium Carbonate-Vitamin  D Take 1 tablet by mouth 2 (two) times daily.   CENTRUM SILVER PO Take 1 tablet by mouth daily.   cyclobenzaprine 10 MG tablet Commonly known as: FLEXERIL Take 1 tablet (10 mg total) by mouth 3 (three) times daily as needed for muscle spasms.   denosumab 60 MG/ML Soln injection Commonly known as: PROLIA Inject 60 mg into the skin every 6 (six) months. Administer in upper arm, thigh, or abdomen   docusate sodium 100 MG capsule Commonly known as: COLACE Take 1 capsule (100 mg total) by mouth daily as needed for mild constipation.   HYDROmorphone 2 MG tablet Commonly known as: DILAUDID Take 1-2 tablets (2-4 mg total) by mouth every 4 (four) hours as needed for moderate pain or severe pain.   metFORMIN 500 MG 24 hr tablet Commonly known as: GLUCOPHAGE-XR Take 500 mg by mouth daily with breakfast.   omeprazole 20 MG capsule Commonly known as: PRILOSEC TAKE 1 CAPSULE(20 MG) BY MOUTH DAILY What changed: See the new instructions.   simvastatin 40 MG tablet Commonly known as: ZOCOR Take 40 mg by mouth daily.   Vitamin D3 50 MCG (2000 UT) capsule Take 2,000 Units by mouth daily.       Follow-up Information    Care, [facility] Follow up.   Specialty: Home Health Services Why: Your home health physical therapy has been set up with [facility]. The agency will call you with a start of service date. If you have any questions or concerns please call the number listed above. Contact information: [address] [place] [address] [phone]        Tressie Stalker, MD Follow up in 2 week(s).   Specialty: Neurosurgery Why: call for appointment Contact information: [address] [place] [address] [phone]               Signed: Alan Mulder Vanessa Boyd 06/11/2020, 10:04 AM

## 2020-06-11 NOTE — Discharge Instructions (Signed)

## 2020-06-11 NOTE — Plan of Care (Signed)
Patient alert and oriented, mae's well, voiding adequate amount of urine, swallowing without difficulty, no c/o pain at time of discharge. Patient discharged home with family. Script and discharged instructions given to patient. Patient and family stated understanding of instructions given. Patient has an appointment with Dr. Jenkins   

## 2020-07-12 ENCOUNTER — Other Ambulatory Visit (HOSPITAL_COMMUNITY): Payer: Self-pay

## 2020-07-13 ENCOUNTER — Other Ambulatory Visit: Payer: Self-pay

## 2020-07-13 ENCOUNTER — Ambulatory Visit (HOSPITAL_COMMUNITY)
Admission: RE | Admit: 2020-07-13 | Discharge: 2020-07-13 | Disposition: A | Discharge status: Home / Self Care | Payer: Medicare Other | Source: Ambulatory Visit | Attending: Internal Medicine | Admitting: Internal Medicine

## 2020-07-13 ENCOUNTER — Encounter (HOSPITAL_COMMUNITY): Payer: Medicare Other

## 2020-07-13 DIAGNOSIS — M810 Age-related osteoporosis without current pathological fracture: Secondary | ICD-10-CM | POA: Insufficient documentation

## 2020-07-13 MED ORDER — DENOSUMAB 60 MG/ML ~~LOC~~ SOSY: 60.0000 mg | PREFILLED_SYRINGE | Freq: Once | SUBCUTANEOUS | Status: AC

## 2020-07-13 MED ORDER — DENOSUMAB 60 MG/ML ~~LOC~~ SOSY
PREFILLED_SYRINGE | SUBCUTANEOUS | Status: AC
  Administered 2020-07-13: 60 mg via SUBCUTANEOUS
  Filled 2020-07-13: qty 1

## 2020-07-18 ENCOUNTER — Other Ambulatory Visit (HOSPITAL_COMMUNITY): Payer: Self-pay | Admitting: Neurosurgery

## 2020-07-18 DIAGNOSIS — M79605 Pain in left leg: Secondary | ICD-10-CM

## 2020-07-19 ENCOUNTER — Other Ambulatory Visit: Payer: Self-pay

## 2020-07-19 ENCOUNTER — Ambulatory Visit (HOSPITAL_COMMUNITY)
Admission: RE | Admit: 2020-07-19 | Discharge: 2020-07-19 | Disposition: A | Discharge status: Home / Self Care | Payer: Medicare Other | Source: Ambulatory Visit | Attending: Neurosurgery | Admitting: Neurosurgery

## 2020-07-19 DIAGNOSIS — M79605 Pain in left leg: Secondary | ICD-10-CM | POA: Insufficient documentation

## 2020-12-12 ENCOUNTER — Other Ambulatory Visit: Payer: Self-pay

## 2020-12-12 ENCOUNTER — Encounter: Payer: Self-pay | Admitting: Podiatry

## 2020-12-12 ENCOUNTER — Ambulatory Visit: Payer: Medicare Other | Admitting: Podiatry

## 2020-12-12 DIAGNOSIS — I7300 Raynaud's syndrome without gangrene: Secondary | ICD-10-CM

## 2020-12-12 DIAGNOSIS — L600 Ingrowing nail: Secondary | ICD-10-CM

## 2020-12-12 NOTE — Patient Instructions (Signed)

## 2020-12-13 NOTE — Progress Notes (Signed)
Subjective:   Patient ID: Vanessa Boyd, female   DOB: 80 y.o.   MRN: [id number]   HPI Patient presents with chronic ingrown toenail of her left big toe and all her nails can be a problem but this 1 has really been sore and making it hard for her to wear shoe gear.  States is been going on for a while and there is been bad structural changes in the nailbed for a long time.  Patient has good health overall and does not smoke   Review of Systems  All other systems reviewed and are negative.       Objective:  Physical Exam Vitals and nursing note reviewed.  Constitutional:      Appearance: She is well-developed and well-nourished.  Cardiovascular:     Pulses: Intact distal pulses.  Pulmonary:     Effort: Pulmonary effort is normal.  Musculoskeletal:        General: Normal range of motion.  Skin:    General: Skin is warm.  Neurological:     Mental Status: She is alert.     Neurovascular status found to be intact muscle strength was found to be adequate range of motion adequate.  Patient is noted to have an incurvated left hallux nail medial border that is painful when pressed and is making shoe gear difficult.  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  I explained procedure risk and I explained healing and patient wants this done.  She also has what appears to be a small amount of Raynaud's on the right that I educated her on but its not significant on this foot and I explained being careful about cold exposure and today I allowed her to sign consent form after review and I infiltrated the left hallux 60 mg like Marcaine mixture sterile prep done using sterile instrumentation remove the lateral border exposed matrix applied phenol 3 applications 30 seconds followed by alcohol lavage  sterile dressing and gave instructions on soaks.  I encouraged her to leave the dressing on 24 hours but take it off earlier if any throbbing were to occur and she is encouraged to call me with any questions concerns which may arise

## 2020-12-15 ENCOUNTER — Other Ambulatory Visit: Payer: Self-pay | Admitting: Internal Medicine

## 2020-12-22 ENCOUNTER — Ambulatory Visit: Payer: Medicare Other | Admitting: Podiatry

## 2021-01-19 ENCOUNTER — Other Ambulatory Visit: Payer: Self-pay

## 2021-01-19 ENCOUNTER — Ambulatory Visit (HOSPITAL_COMMUNITY)
Admission: RE | Admit: 2021-01-19 | Discharge: 2021-01-19 | Disposition: A | Discharge status: Home / Self Care | Payer: Medicare Other | Source: Ambulatory Visit | Attending: Internal Medicine | Admitting: Internal Medicine

## 2021-01-19 DIAGNOSIS — M810 Age-related osteoporosis without current pathological fracture: Secondary | ICD-10-CM | POA: Diagnosis present

## 2021-01-19 MED ORDER — DENOSUMAB 60 MG/ML ~~LOC~~ SOSY
PREFILLED_SYRINGE | SUBCUTANEOUS | Status: AC
  Administered 2021-01-19: 60 mg via SUBCUTANEOUS
  Filled 2021-01-19: qty 1

## 2021-01-19 MED ORDER — DENOSUMAB 60 MG/ML ~~LOC~~ SOSY: 60.0000 mg | PREFILLED_SYRINGE | Freq: Once | SUBCUTANEOUS | Status: AC

## 2021-03-10 ENCOUNTER — Encounter: Payer: Self-pay | Admitting: Podiatry

## 2021-03-10 ENCOUNTER — Ambulatory Visit: Payer: Medicare Other | Admitting: Podiatry

## 2021-03-10 ENCOUNTER — Other Ambulatory Visit: Payer: Self-pay

## 2021-03-10 ENCOUNTER — Ambulatory Visit: Payer: Medicare Other

## 2021-03-10 DIAGNOSIS — M79671 Pain in right foot: Secondary | ICD-10-CM | POA: Diagnosis not present

## 2021-03-10 DIAGNOSIS — I7300 Raynaud's syndrome without gangrene: Secondary | ICD-10-CM | POA: Diagnosis not present

## 2021-03-10 NOTE — Progress Notes (Signed)
Subjective:   Patient ID: Vanessa Boyd, female   DOB: 80 y.o.   MRN: [id number]   HPI Patient presents stating she is still concerned about this fourth toe on her right foot the discoloration and whether or not there could be a further problem neuro   ROS      Objective:  Physical Exam  Vascular status intact with patient found to have trauma to the digit right which is most likely related to Raynaud's for possible emboli shower     Assessment:  Circulatory issue right stable no breakdown of tissue minimal discomfort     Plan:  H&P discussed condition do not recommend treatment as there is really nothing to do and it should gradually get better but this may take upwards of 6 months to a year to normalize.  If any pain or drainage were to occur reappoint immediately

## 2021-05-22 HISTORY — PX: BACK SURGERY: SHX140

## 2021-07-03 ENCOUNTER — Other Ambulatory Visit (HOSPITAL_COMMUNITY): Payer: Self-pay | Admitting: *Deleted

## 2021-07-04 ENCOUNTER — Ambulatory Visit (HOSPITAL_COMMUNITY)
Admission: RE | Admit: 2021-07-04 | Discharge: 2021-07-04 | Disposition: A | Discharge status: Home / Self Care | Payer: Medicare Other | Source: Ambulatory Visit | Attending: Internal Medicine | Admitting: Internal Medicine

## 2021-07-04 DIAGNOSIS — M810 Age-related osteoporosis without current pathological fracture: Secondary | ICD-10-CM | POA: Diagnosis not present

## 2021-07-04 MED ORDER — DENOSUMAB 60 MG/ML ~~LOC~~ SOSY
60.0000 mg | PREFILLED_SYRINGE | Freq: Once | SUBCUTANEOUS | Status: AC
  Administered 2021-07-04: 60 mg via SUBCUTANEOUS

## 2021-07-04 MED ORDER — DENOSUMAB 60 MG/ML ~~LOC~~ SOSY
PREFILLED_SYRINGE | SUBCUTANEOUS | Status: AC
  Filled 2021-07-04: qty 1

## 2021-11-16 ENCOUNTER — Other Ambulatory Visit: Payer: Self-pay

## 2021-11-16 ENCOUNTER — Ambulatory Visit: Payer: Medicare Other | Admitting: Podiatry

## 2021-11-16 ENCOUNTER — Encounter: Payer: Self-pay | Admitting: Podiatry

## 2021-11-16 DIAGNOSIS — I7300 Raynaud's syndrome without gangrene: Secondary | ICD-10-CM

## 2021-11-16 DIAGNOSIS — M79671 Pain in right foot: Secondary | ICD-10-CM | POA: Diagnosis not present

## 2021-11-19 NOTE — Progress Notes (Signed)
Subjective:   Patient ID: Vanessa Boyd, female   DOB: 81 y.o.   MRN: [id number]   HPI Patient presents stating that she has had some pain with her third digit right and is not sure about cold exposure but she is developed some redness and drainage at the end and is also a little bit concerned about the left with no opening but wanted it looked at   ROS      Objective:  Physical Exam  Neurovascular status not an issue with good bounding pulses and good digital perfusion noted with a low-grade distal breakdown of the third digit right localized no subcutaneous exposure and mild discomfort and slight petechial formation left with no subcutaneous exposure     Assessment:  Most likely this is Raynaud's disease with a low-grade stress to the right third toe localized     Plan:  H&P educated her on condition and recommended warm soaks and cushioning and wider shoes and protection against cold.  I tried to explain this all to her I do not see any other issue but if it gets worse we will see back and patient is encouraged to call questions concerns which may arise but should heal uneventfully

## 2022-02-04 ENCOUNTER — Other Ambulatory Visit: Payer: Self-pay | Admitting: Gastroenterology

## 2022-02-05 ENCOUNTER — Other Ambulatory Visit: Payer: Self-pay | Admitting: Internal Medicine

## 2022-02-06 ENCOUNTER — Telehealth: Payer: Self-pay | Admitting: Internal Medicine

## 2022-02-06 NOTE — Telephone Encounter (Signed)
Inbound call from patient stating that he is in need of a refill on Prilosec. Patient stated that there was some issues with the pharmacy due to a concern with the Dr. Patient is seeking advice on what she needs to do to get her medication.  ?

## 2022-02-06 NOTE — Telephone Encounter (Signed)
Called and spoke to patient, let her know we have not seen her  in 3 years, since 2020. Scheduled her for an appointment with Dr. Carlean Purl in May. Patient indicated she does not need a refill of Prilosec right now, she has enough medication. Will discuss refills with Dr. Carlean Purl at next [facility] appointment. ? ?

## 2022-02-27 ENCOUNTER — Encounter: Payer: Self-pay | Admitting: Internal Medicine

## 2022-02-27 ENCOUNTER — Ambulatory Visit: Payer: Medicare Other | Admitting: Internal Medicine

## 2022-02-27 VITALS — Ht 59.0 in | Wt 146.0 lb

## 2022-02-27 DIAGNOSIS — K257 Chronic gastric ulcer without hemorrhage or perforation: Secondary | ICD-10-CM | POA: Diagnosis not present

## 2022-02-27 DIAGNOSIS — K2960 Other gastritis without bleeding: Secondary | ICD-10-CM

## 2022-02-27 DIAGNOSIS — K5730 Diverticulosis of large intestine without perforation or abscess without bleeding: Secondary | ICD-10-CM

## 2022-02-27 MED ORDER — OMEPRAZOLE 20 MG PO CPDR: DELAYED_RELEASE_CAPSULE | ORAL | 3 refills | Status: DC

## 2022-02-27 NOTE — Progress Notes (Signed)
? Vanessa Boyd 81 y.o. Aug 23, 1941 [id number] ? ?Assessment & Plan:  ? ?Encounter Diagnoses  ?Name Primary?  ? Erosive gastritis Yes  ? Hiatal Hernia w/ Sheria Lang ulcers   ? Diverticulosis of colon without hemorrhage   ? ? ?With these problems in the past and some NSAID use still I think the better part of valor is to continue her low-dose PPI so I have refilled her omeprazole 20 mg daily.  We reviewed diverticulosis some.  She will return here as needed, I have asked her to discuss maintenance of her omeprazole prescription through primary care if that is possible if not we can see her back every 2 years or so and refill and reassess. ? ?CC: Garlan Fillers, MD ? ? ?Subjective:  ? ?Chief Complaint: Omeprazole refill in setting of gastritis ? ?HPI ? ?81 year old white woman who was last seen in 2020 with heme positive stool, evaluated with EGD and colonoscopy.  At that time she was using NSAIDs regularly now it is just as needed naproxen.  Couple times a month maybe, now.  She had an erosive gastritis I hiatal hernia with Cameron's ulcers and the biopsies were H. pylori negative on the gastritis, in March 2020.  She had fairly severe diverticulosis in the left colon with some stenosis/narrowing of the colon on colonoscopy at the same time. ? ?Today she returns requesting a refill on her medication she has been taking it.  When she runs out of it she does notice some heartburn at times. ? ?Wt Readings from Last 3 Encounters:  ?02/27/22 146 lb (66.2 kg)  ?06/08/20 152 lb 5.4 oz (69.1 kg)  ?06/01/20 152 lb 4.8 oz (69.1 kg)  ? ? ? ?Allergies  ?Allergen Reactions  ? 5-Alpha Reductase Inhibitors   ? Codeine   ?  REACTION: nausea/vomiting  ? Hydromorphone Other (See Comments)  ?  Hallucinations ?  ? ?Current Meds  ?Medication Sig  ? ascorbic acid (VITAMIN C) 500 MG tablet Take 500 mg by mouth daily.  ? Calcium Carbonate-Vitamin D 600-400 MG-UNIT tablet Take 1 tablet by mouth 2 (two) times daily.   ? Cholecalciferol  (VITAMIN D3) 50 MCG (2000 UT) capsule Take 2,000 Units by mouth daily.   ? cyclobenzaprine (FLEXERIL) 10 MG tablet Take 1 tablet (10 mg total) by mouth 3 (three) times daily as needed for muscle spasms.  ? denosumab (PROLIA) 60 MG/ML SOLN injection Inject 60 mg into the skin every 6 (six) months. Administer in upper arm, thigh, or abdomen  ? metFORMIN (GLUCOPHAGE-XR) 500 MG 24 hr tablet Take 500 mg by mouth daily with breakfast.   ? Multiple Vitamins-Minerals (CENTRUM SILVER PO) Take 1 tablet by mouth daily.    ? Naproxen Sodium 220 MG CAPS Take 440 mg by mouth daily as needed (pain,headache).   ? simvastatin (ZOCOR) 40 MG tablet Take 40 mg by mouth daily.  ?  omeprazole (PRILOSEC) 20 MG capsule TAKE 1 CAPSULE(20 MG) BY MOUTH DAILY  ? ?Past Medical History:  ?Diagnosis Date  ? Abnormal bone density screening   ? Diverticulitis   ? Hypertension   ? Hyperthyroidism   ? Hypoglycemia, unspecified   ? IBS (irritable bowel syndrome)   ? Obesity   ? Osteoporosis   ? Palpitations   ? Post-menopausal   ? Spinal stenosis 2017  ? ?Past Surgical History:  ?Procedure Laterality Date  ? ABDOMINAL HYSTERECTOMY    ? APPENDECTOMY    ? BACK SURGERY  05/2021  ? EYE SURGERY    ?  bilateral cataract surgery  ? OOPHORECTOMY    ? orif left elbow    ? SHOULDER OPEN ROTATOR CUFF REPAIR    ? TONSILLECTOMY    ? ?Social History  ? ?Social History Narrative  ? Married '62 - widowed '85; 3 sons - '63, '64, '66; 6 grandchildren. HSG. 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 Has a good support network. End- of- Life: OK for CPR, no prolonged ventilator support, no prolong heroic or futile measures.   ? ?family history includes COPD in her mother; Hyperlipidemia in her mother and sister; Hypertension in her sister. ? ? ?Review of Systems ?As per HPI ? ?Objective:  ? Physical Exam ?Ht 4\' 11"  (1.499 m)   Wt 146 lb (66.2 kg)   BMI 29.49 kg/m?  ? ? ?

## 2022-02-27 NOTE — Patient Instructions (Addendum)
We have sent the following medications to your pharmacy for you to pick up at your convenience: ?Omeprazole , check to see if your PCP will refill this for you. ? ? ?I appreciate the opportunity to care for you. ?Silvano Rusk, MD, [facility] ? ?

## 2022-04-04 ENCOUNTER — Emergency Department (HOSPITAL_COMMUNITY)
Admission: EM | Admit: 2022-04-04 | Discharge: 2022-04-05 | Disposition: A | Discharge status: Home / Self Care | Payer: Medicare Other | Attending: Emergency Medicine | Admitting: Emergency Medicine

## 2022-04-04 ENCOUNTER — Encounter (HOSPITAL_COMMUNITY): Payer: Self-pay | Admitting: Emergency Medicine

## 2022-04-04 ENCOUNTER — Other Ambulatory Visit: Payer: Self-pay

## 2022-04-04 DIAGNOSIS — R000 Tachycardia, unspecified: Secondary | ICD-10-CM

## 2022-04-04 DIAGNOSIS — Z87891 Personal history of nicotine dependence: Secondary | ICD-10-CM | POA: Diagnosis not present

## 2022-04-04 DIAGNOSIS — I10 Essential (primary) hypertension: Secondary | ICD-10-CM | POA: Insufficient documentation

## 2022-04-04 DIAGNOSIS — Z79899 Other long term (current) drug therapy: Secondary | ICD-10-CM | POA: Insufficient documentation

## 2022-04-04 DIAGNOSIS — R059 Cough, unspecified: Secondary | ICD-10-CM | POA: Diagnosis not present

## 2022-04-04 NOTE — ED Triage Notes (Signed)
  Patient comes in with tachycardia that started around 2300.  Patient states she was asleep on the couch and woke up feeling her heart racing.  Patient had no chest pain or concerning symptoms. Her fitbit stated her HR was 122 so she decided to come in to be assessed.  No cardiac hx.  No other concerning symptoms.  No pain at this time.

## 2022-04-05 LAB — CBC WITH DIFFERENTIAL/PLATELET
Abs Immature Granulocytes: 0.06 10*3/uL (ref 0.00–0.07)
Basophils Absolute: 0 10*3/uL (ref 0.0–0.1)
Basophils Relative: 1 %
Eosinophils Absolute: 0.2 10*3/uL (ref 0.0–0.5)
Eosinophils Relative: 4 %
HCT: 44.1 % (ref 36.0–46.0)
Hemoglobin: 13.9 g/dL (ref 12.0–15.0)
Immature Granulocytes: 1 %
Lymphocytes Relative: 31 %
Lymphs Abs: 1.7 10*3/uL (ref 0.7–4.0)
MCH: 30.3 pg (ref 26.0–34.0)
MCHC: 31.5 g/dL (ref 30.0–36.0)
MCV: 96.1 fL (ref 80.0–100.0)
Monocytes Absolute: 0.5 10*3/uL (ref 0.1–1.0)
Monocytes Relative: 10 %
Neutro Abs: 3 10*3/uL (ref 1.7–7.7)
Neutrophils Relative %: 53 %
Platelets: 231 10*3/uL (ref 150–400)
RBC: 4.59 MIL/uL (ref 3.87–5.11)
RDW: 13.2 % (ref 11.5–15.5)
WBC: 5.6 10*3/uL (ref 4.0–10.5)
nRBC: 0 % (ref 0.0–0.2)

## 2022-04-05 LAB — BASIC METABOLIC PANEL
Anion gap: 6 (ref 5–15)
BUN: 15 mg/dL (ref 8–23)
CO2: 30 mmol/L (ref 22–32)
Calcium: 9.8 mg/dL (ref 8.9–10.3)
Chloride: 104 mmol/L (ref 98–111)
Creatinine, Ser: 0.75 mg/dL (ref 0.44–1.00)
GFR, Estimated: >60 mL/min (ref >60)
Glucose, Bld: 148 mg/dL — ABNORMAL HIGH (ref 70–99)
Potassium: 3.9 mmol/L (ref 3.5–5.1)
Sodium: 140 mmol/L (ref 135–145)

## 2022-04-05 LAB — URINALYSIS, ROUTINE W REFLEX MICROSCOPIC
Bilirubin Urine: NEGATIVE
Glucose, UA: NEGATIVE mg/dL
Hgb urine dipstick: NEGATIVE
Ketones, ur: NEGATIVE mg/dL
Leukocytes,Ua: NEGATIVE
Nitrite: NEGATIVE
Protein, ur: NEGATIVE mg/dL
Specific Gravity, Urine: 1.008 (ref 1.005–1.030)
pH: 7 (ref 5.0–8.0)

## 2022-04-05 LAB — TSH: TSH: 2.583 u[IU]/mL (ref 0.350–4.500)

## 2022-04-05 NOTE — ED Provider Notes (Signed)
[facility]-EMERGENCY DEPT Provider Note: Lowella Dell, MD, FACEP  CSN: [id number] MRN: [id number] ARRIVAL: 04/04/22 at 2332 ROOM: RESB/RESB   CHIEF COMPLAINT  Tachycardia   HISTORY OF PRESENT ILLNESS  04/05/22 4:36 AM Vanessa Boyd is a 81 y.o. female who fell her heart rate speed up about 11 PM yesterday evening.  She was asleep on the couch and woke up with this sensation.  She has had no chest pain, shortness of breath or lightheadedness with this.  Her Fitbit showed her heart rate to be 122 so she came in to be assessed.  She has no history of cardiac disease or arrhythmia.  She denies any other symptoms.  She did have an episode of hyperthyroidism that was causing tachycardia but she had 2 ablations and has had no further issues with this.  She states her thyroid numbers have been normal.   Past Medical History:  Diagnosis Date   Abnormal bone density screening    Diverticulitis    Hypertension    Hyperthyroidism    Hypoglycemia, unspecified    IBS (irritable bowel syndrome)    Obesity    Osteoporosis    Palpitations    Post-menopausal    Spinal stenosis 2017    Past Surgical History:  Procedure Laterality Date   ABDOMINAL HYSTERECTOMY     APPENDECTOMY     BACK SURGERY  05/2021   EYE SURGERY     bilateral cataract surgery   OOPHORECTOMY     orif left elbow     SHOULDER OPEN ROTATOR CUFF REPAIR     TONSILLECTOMY      Family History  Problem Relation Age of Onset   COPD Mother    Hyperlipidemia Mother    Hypertension Sister    Hyperlipidemia Sister    Diabetes Neg Hx    Heart disease Neg Hx    Colon cancer Neg Hx    Rectal cancer Neg Hx    Stomach cancer Neg Hx    Esophageal cancer Neg Hx     Social History   Tobacco Use   Smoking status: Former    Years: 20.00    Types: Cigarettes    Quit date: 10/22/1998    Years since quitting: 23.4   Smokeless tobacco: Never  Vaping Use   Vaping Use: Never used  Substance Use Topics   Alcohol use: Yes     Alcohol/week: 2.0 standard drinks of alcohol    Types: 2 Standard drinks or equivalent per week    Comment: occasionally   Drug use: No    Prior to Admission medications   Medication Sig Start Date End Date Taking? Authorizing Provider  ascorbic acid (VITAMIN C) 500 MG tablet Take 500 mg by mouth daily.    [provider]  Calcium Carbonate-Vitamin D 600-400 MG-UNIT tablet Take 1 tablet by mouth 2 (two) times daily.     [provider]  Cholecalciferol (VITAMIN D3) 50 MCG (2000 UT) capsule Take 2,000 Units by mouth daily.     [provider]  cyclobenzaprine (FLEXERIL) 10 MG tablet Take 1 tablet (10 mg total) by mouth 3 (three) times daily as needed for muscle spasms. 06/11/20   Dawley, Troy C, DO  denosumab (PROLIA) 60 MG/ML SOLN injection Inject 60 mg into the skin every 6 (six) months. Administer in upper arm, thigh, or abdomen    [provider]  gabapentin (NEURONTIN) 600 MG tablet Take 600 mg by mouth 3 (three) times daily. 03/12/22   [provider]  metFORMIN (GLUCOPHAGE-XR) 500 MG 24 hr tablet Take 500 mg by mouth daily with breakfast.     [provider]  Multiple Vitamins-Minerals (CENTRUM SILVER PO) Take 1 tablet by mouth daily.      [provider]  Naproxen Sodium 220 MG CAPS Take 440 mg by mouth daily as needed (pain,headache).     [provider]  omeprazole (PRILOSEC) 20 MG capsule TAKE 1 CAPSULE(20 MG) BY MOUTH DAILY 02/27/22   Iva Boop, MD  OZEMPIC, 1 MG/DOSE, 4 MG/3ML SOPN Inject 1 mg into the skin once a week. 03/23/22   [provider]  simvastatin (ZOCOR) 40 MG tablet Take 40 mg by mouth daily. 10/06/16   [provider]    Allergies 5-alpha reductase inhibitors, Codeine, and Hydromorphone   REVIEW OF SYSTEMS  Negative except as noted here or in the History of Present Illness.   PHYSICAL EXAMINATION  Initial Vital Signs Blood pressure (!) 168/96, pulse 98, temperature 98  F (36.7 C), temperature source Oral, resp. rate 18, height 4\' 11"  (1.499 m), weight 63.5 kg, SpO2 97 %.  Examination General: Well-developed, well-nourished female in no acute distress; appearance consistent with age of record HENT: normocephalic; atraumatic Eyes: pupils equal, round and reactive to light; extraocular muscles intact Neck: supple Heart: regular rate and rhythm; tachycardia Lungs: clear to auscultation bilaterally Abdomen: soft; nondistended; nontender; bowel sounds present Extremities: No deformity; full range of motion; pulses normal Neurologic: Awake, alert and oriented; motor function intact in all extremities and symmetric; no facial droop Skin: Warm and dry Psychiatric: Normal mood and affect   RESULTS  Summary of this visit's results, reviewed and interpreted by myself:   EKG Interpretation  Date/Time:  Wednesday April 04 2022 23:45:40 EDT Ventricular Rate:  118 PR Interval:  142 QRS Duration: 85 QT Interval:  303 QTC Calculation: 425 R Axis:   4 Text Interpretation: Sinus tachycardia Ventricular premature complex Rate is faster Confirmed by Bence Trapp, [birth year]1985 (Jonny Ruiz) on 04/04/2022 11:48:42 PM       Laboratory Studies: Results for orders placed or performed during the hospital encounter of 04/04/22 (from the past 24 hour(s))  CBC with Differential     Status: None   Collection Time: 04/04/22 11:49 PM  Result Value Ref Range   WBC 5.6 4.0 - 10.5 K/uL   RBC 4.59 3.87 - 5.11 MIL/uL   Hemoglobin 13.9 12.0 - 15.0 g/dL   HCT 04/06/22 02.5 - 42.7 %   MCV 96.1 80.0 - 100.0 fL   MCH 30.3 26.0 - 34.0 pg   MCHC 31.5 30.0 - 36.0 g/dL   RDW 06.2 37.6 - 28.3 %   Platelets 231 150 - 400 K/uL   nRBC 0.0 0.0 - 0.2 %   Neutrophils Relative % 53 %   Neutro Abs 3.0 1.7 - 7.7 K/uL   Lymphocytes Relative 31 %   Lymphs Abs 1.7 0.7 - 4.0 K/uL   Monocytes Relative 10 %   Monocytes Absolute 0.5 0.1 - 1.0 K/uL   Eosinophils Relative 4 %   Eosinophils Absolute 0.2 0.0 - 0.5  K/uL   Basophils Relative 1 %   Basophils Absolute 0.0 0.0 - 0.1 K/uL   Immature Granulocytes 1 %   Abs Immature Granulocytes 0.06 0.00 - 0.07 K/uL  Basic metabolic panel     Status: Abnormal   Collection Time: 04/04/22 11:49 PM  Result Value Ref Range   Sodium 140 135 - 145 mmol/L   Potassium 3.9 3.5 -  5.1 mmol/L   Chloride 104 98 - 111 mmol/L   CO2 30 22 - 32 mmol/L   Glucose, Bld 148 (H) 70 - 99 mg/dL   BUN 15 8 - 23 mg/dL   Creatinine, Ser 0.92 0.44 - 1.00 mg/dL   Calcium 9.8 8.9 - 33.0 mg/dL   GFR, Estimated >07 >62 mL/min   Anion gap 6 5 - 15  TSH     Status: None   Collection Time: 04/04/22 11:49 PM  Result Value Ref Range   TSH 2.583 0.350 - 4.500 uIU/mL  Urinalysis, Routine w reflex microscopic Urine, Clean Catch     Status: Abnormal   Collection Time: 04/04/22 11:54 PM  Result Value Ref Range   Color, Urine YELLOW YELLOW   APPearance HAZY (A) CLEAR   Specific Gravity, Urine 1.008 1.005 - 1.030   pH 7.0 5.0 - 8.0   Glucose, UA NEGATIVE NEGATIVE mg/dL   Hgb urine dipstick NEGATIVE NEGATIVE   Bilirubin Urine NEGATIVE NEGATIVE   Ketones, ur NEGATIVE NEGATIVE mg/dL   Protein, ur NEGATIVE NEGATIVE mg/dL   Nitrite NEGATIVE NEGATIVE   Leukocytes,Ua NEGATIVE NEGATIVE   Imaging Studies: No results found.  ED COURSE and MDM  Nursing notes, initial and subsequent vitals signs, including pulse oximetry, reviewed and interpreted by myself.  Vitals:   04/04/22 2345 04/05/22 0355 04/05/22 0433  BP: (!) 166/102 (!) 147/101 (!) 168/96  Pulse: (!) 120 98   Resp: 15 18 18   Temp: 98 F (36.7 C)    TempSrc: Oral    SpO2: 98% 97%   Weight: 63.5 kg    Height: 4\' 11"  (1.499 m)     Medications - No data to display  4:43 AM On my first evaluation of the patient her heart rate was 120.  Her heart rate is now in the mid 80s without intervention.  The cause of her tachycardia is unclear.  She had 1 diet Pepsi while waiting in the waiting room earlier but this was after her onset  of symptoms.  She denies any new medications or herbal supplements.  Her TSH is within normal limits.  She plans to follow-up with her PCP later today.  PROCEDURES  Procedures   ED DIAGNOSES     ICD-10-CM   1. Sinus tachycardia  R00.0          Hassie Mandt, , MD 04/05/22 [phone]

## 2022-04-16 ENCOUNTER — Encounter: Payer: Self-pay | Admitting: Cardiovascular Disease

## 2022-04-16 ENCOUNTER — Ambulatory Visit: Payer: Medicare Other | Admitting: Cardiovascular Disease

## 2022-04-16 VITALS — BP 126/78 | HR 62 | Ht 59.0 in | Wt 142.0 lb

## 2022-04-16 DIAGNOSIS — E782 Mixed hyperlipidemia: Secondary | ICD-10-CM | POA: Diagnosis not present

## 2022-04-16 DIAGNOSIS — E0500 Thyrotoxicosis with diffuse goiter without thyrotoxic crisis or storm: Secondary | ICD-10-CM | POA: Diagnosis not present

## 2022-04-16 DIAGNOSIS — R000 Tachycardia, unspecified: Secondary | ICD-10-CM

## 2022-04-18 ENCOUNTER — Other Ambulatory Visit (HOSPITAL_BASED_OUTPATIENT_CLINIC_OR_DEPARTMENT_OTHER): Payer: Medicare Other

## 2022-04-23 ENCOUNTER — Ambulatory Visit (HOSPITAL_BASED_OUTPATIENT_CLINIC_OR_DEPARTMENT_OTHER)
Admission: RE | Admit: 2022-04-23 | Discharge: 2022-04-23 | Disposition: A | Discharge status: Home / Self Care | Payer: Medicare Other | Source: Ambulatory Visit | Attending: Cardiovascular Disease | Admitting: Cardiovascular Disease

## 2022-04-23 ENCOUNTER — Encounter (HOSPITAL_BASED_OUTPATIENT_CLINIC_OR_DEPARTMENT_OTHER): Payer: Self-pay

## 2022-04-23 DIAGNOSIS — R000 Tachycardia, unspecified: Secondary | ICD-10-CM | POA: Insufficient documentation

## 2022-04-23 DIAGNOSIS — E782 Mixed hyperlipidemia: Secondary | ICD-10-CM | POA: Insufficient documentation

## 2022-04-25 ENCOUNTER — Encounter: Payer: Self-pay | Admitting: *Deleted

## 2022-04-25 ENCOUNTER — Telehealth: Payer: Self-pay | Admitting: *Deleted

## 2022-04-25 DIAGNOSIS — E782 Mixed hyperlipidemia: Secondary | ICD-10-CM

## 2022-04-25 NOTE — Telephone Encounter (Signed)
-----   Message from Wendall Stade, MD sent at 04/25/2022  8:00 AM EDT ----- Calcium score very high 717  87 th percentile Needs f/u lexiscan myovue and updated lipids continue zocor F/U in a year if myovue non ischemic

## 2022-04-25 NOTE — Telephone Encounter (Signed)
-----   Message from Peter C Nishan, MD sent at 04/25/2022  8:00 AM EDT ----- Calcium score very high 717  87 th percentile Needs f/u lexiscan myovue and updated lipids continue zocor F/U in a year if myovue non ischemic  

## 2022-05-04 ENCOUNTER — Ambulatory Visit (HOSPITAL_COMMUNITY): Payer: Medicare Other | Attending: Cardiology

## 2022-05-04 DIAGNOSIS — E162 Hypoglycemia, unspecified: Secondary | ICD-10-CM | POA: Insufficient documentation

## 2022-05-04 DIAGNOSIS — I350 Nonrheumatic aortic (valve) stenosis: Secondary | ICD-10-CM | POA: Diagnosis not present

## 2022-05-04 DIAGNOSIS — I10 Essential (primary) hypertension: Secondary | ICD-10-CM | POA: Insufficient documentation

## 2022-05-04 DIAGNOSIS — E669 Obesity, unspecified: Secondary | ICD-10-CM | POA: Diagnosis not present

## 2022-05-04 DIAGNOSIS — Z87891 Personal history of nicotine dependence: Secondary | ICD-10-CM | POA: Diagnosis not present

## 2022-05-04 DIAGNOSIS — E0590 Thyrotoxicosis, unspecified without thyrotoxic crisis or storm: Secondary | ICD-10-CM | POA: Insufficient documentation

## 2022-05-04 DIAGNOSIS — E785 Hyperlipidemia, unspecified: Secondary | ICD-10-CM | POA: Insufficient documentation

## 2022-05-04 DIAGNOSIS — R000 Tachycardia, unspecified: Secondary | ICD-10-CM

## 2022-05-04 DIAGNOSIS — M4800 Spinal stenosis, site unspecified: Secondary | ICD-10-CM | POA: Insufficient documentation

## 2022-05-04 LAB — ECHOCARDIOGRAM COMPLETE
AR max vel: 1.43 cm2
AV Area VTI: 1.43 cm2
AV Area mean vel: 1.3 cm2
AV Mean grad: 10 mmHg
AV Peak grad: 19 mmHg
Ao pk vel: 2.18 m/s
Area-P 1/2: 3.15 cm2
S' Lateral: 3.3 cm

## 2022-05-08 ENCOUNTER — Telehealth (HOSPITAL_COMMUNITY): Payer: Self-pay

## 2022-05-08 NOTE — Telephone Encounter (Signed)
Detailed instructions left on the patient's answering machine. Asked to call back with any questions. S.Madison Direnzo EMTP 

## 2022-05-09 ENCOUNTER — Other Ambulatory Visit: Payer: Self-pay

## 2022-05-09 DIAGNOSIS — R079 Chest pain, unspecified: Secondary | ICD-10-CM

## 2022-05-10 ENCOUNTER — Ambulatory Visit (HOSPITAL_COMMUNITY): Payer: Medicare Other

## 2022-05-10 ENCOUNTER — Encounter (HOSPITAL_COMMUNITY): Payer: Self-pay

## 2022-05-10 ENCOUNTER — Other Ambulatory Visit: Payer: Medicare Other

## 2022-05-10 ENCOUNTER — Other Ambulatory Visit (HOSPITAL_COMMUNITY): Payer: Self-pay

## 2022-05-11 ENCOUNTER — Ambulatory Visit (HOSPITAL_COMMUNITY)
Admission: RE | Admit: 2022-05-11 | Discharge: 2022-05-11 | Disposition: A | Discharge status: Home / Self Care | Payer: Medicare Other | Source: Ambulatory Visit | Attending: Internal Medicine | Admitting: Internal Medicine

## 2022-05-11 DIAGNOSIS — M810 Age-related osteoporosis without current pathological fracture: Secondary | ICD-10-CM | POA: Diagnosis present

## 2022-05-11 MED ORDER — DENOSUMAB 60 MG/ML ~~LOC~~ SOSY
PREFILLED_SYRINGE | SUBCUTANEOUS | Status: AC
  Filled 2022-05-11: qty 1

## 2022-05-11 MED ORDER — DENOSUMAB 60 MG/ML ~~LOC~~ SOSY
60.0000 mg | PREFILLED_SYRINGE | Freq: Once | SUBCUTANEOUS | Status: AC
  Administered 2022-05-11: 60 mg via SUBCUTANEOUS

## 2022-05-16 ENCOUNTER — Telehealth: Payer: Self-pay | Admitting: Cardiovascular Disease

## 2022-05-16 NOTE — Telephone Encounter (Signed)
Calling to make provider aware that CPT Code - 41937 has been approved.   Authorization number - [id number]  Please advise

## 2022-05-18 IMAGING — MR MR LUMBAR SPINE W/O CM
5 series · 45 of 48 positions shown · non-contrast
Comparison: None.

CLINICAL DATA: Low back pain radiating to the right buttock

EXAM:
MRI LUMBAR SPINE WITHOUT CONTRAST
TECHNIQUE: Multiplanar, multisequence MR imaging of the lumbar spine was
performed. No intravenous contrast was administered.

[Series 3: tirm sag · sagittal · 4.0mm · 0.55mm/px · 5 of 13 slices shown]
[im 1/13]
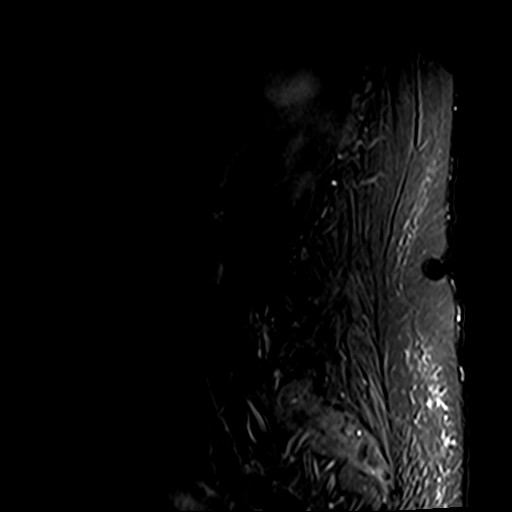
[im 4/13]
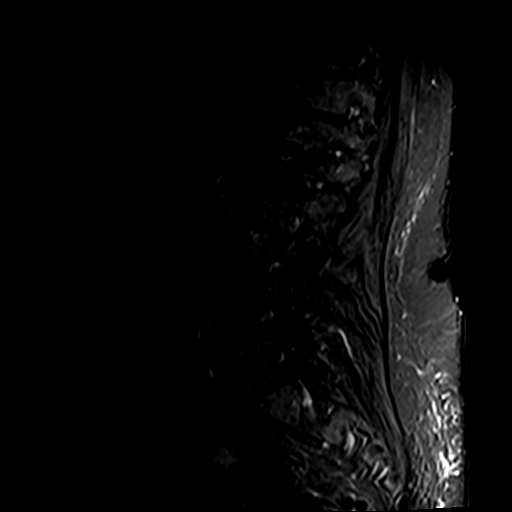
[im 7/13]
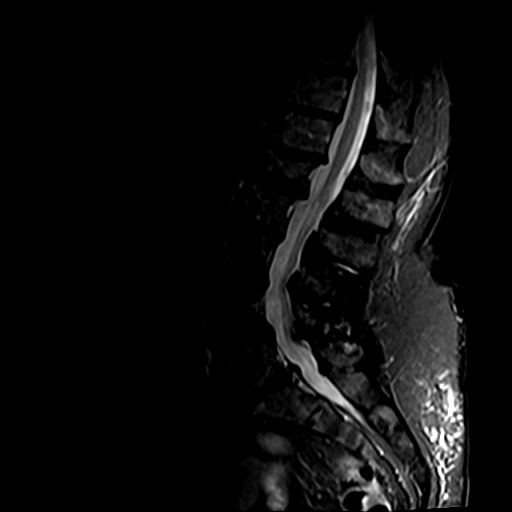
[im 10/13]
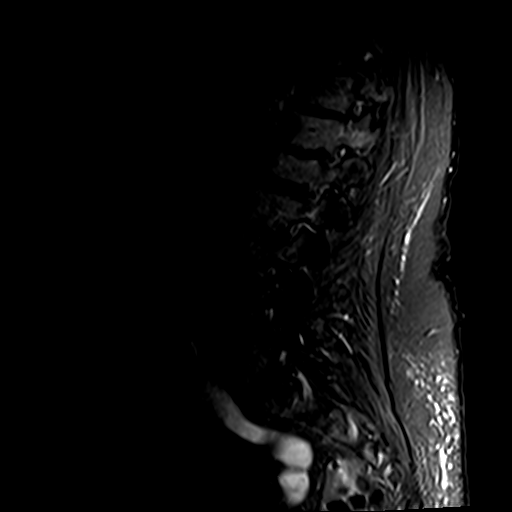
[im 13/13]
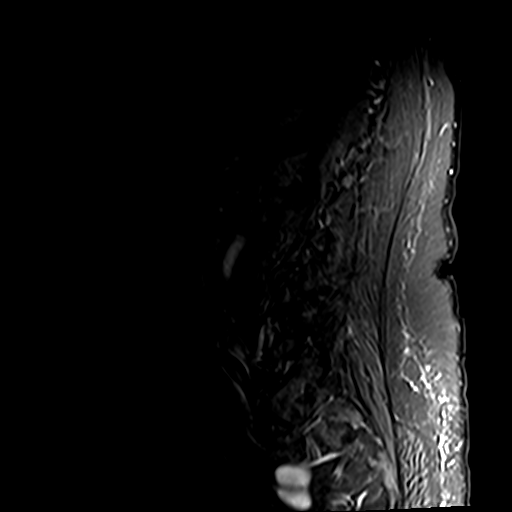

[Series 4: T2 · sagittal · 4.0mm · 0.88mm/px · 5 of 13 slices shown (1 of 2)]
[im 1/13]
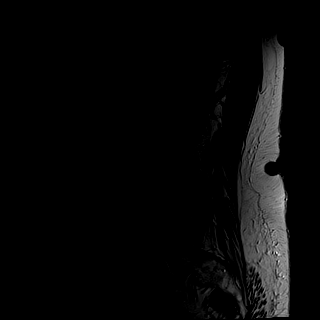
[im 4/13]
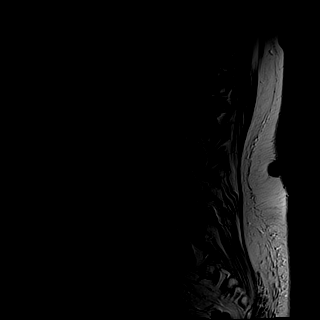
[im 7/13]
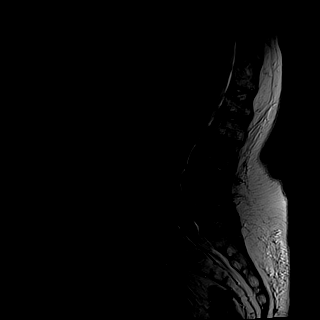
[im 10/13]
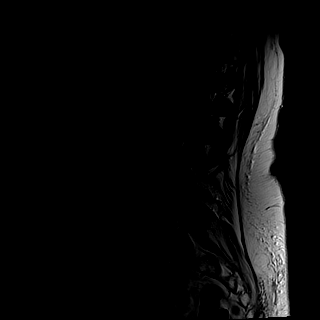
[im 13/13]
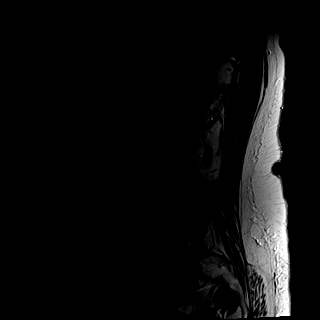

[Series 5: T1 · sagittal · 4.0mm · 0.88mm/px · 6 of 13 slices shown (1 of 2)]
[im 1/13]
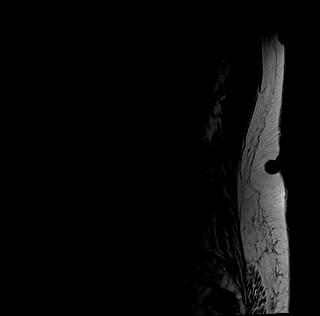
[im 3/13]
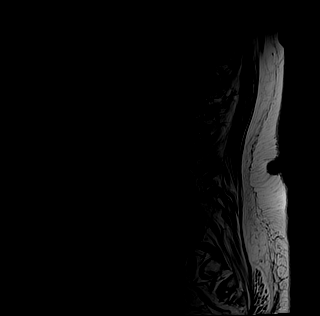
[im 5/13]
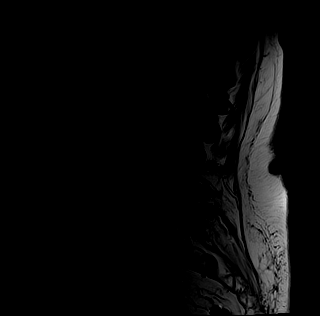
[im 8/13]
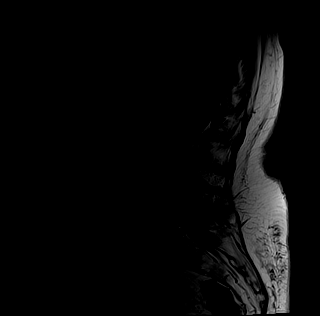
[im 10/13]
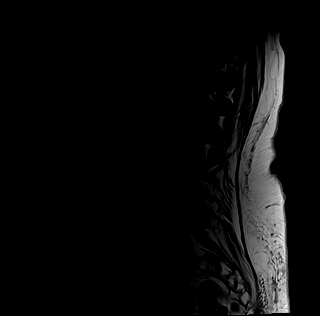
[im 13/13]
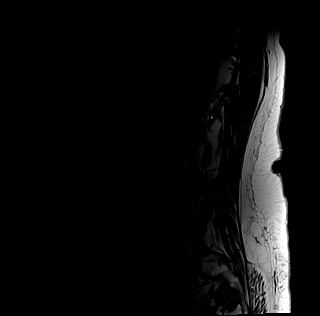

[Series 6: T1 · axial · 4.0mm · 0.78mm/px · z∈[-47,+161]mm · 13 of 37 slices shown (2 of 2)]
[im 1/37]
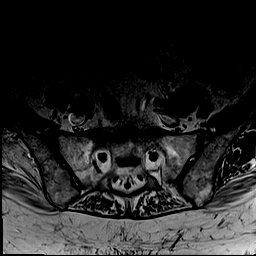
[im 3/37]
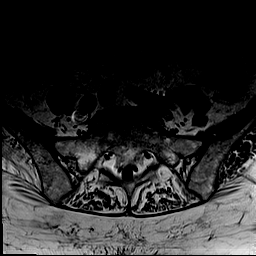
[im 5/37]
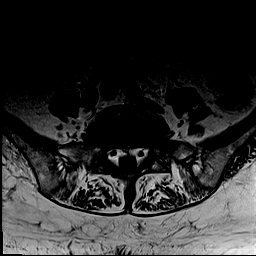
[im 8/37]
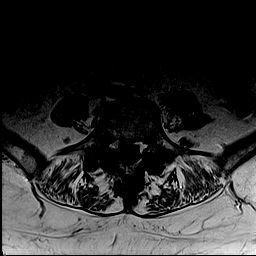
[im 10/37]
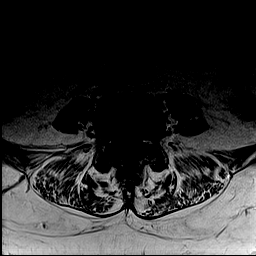
[im 13/37]
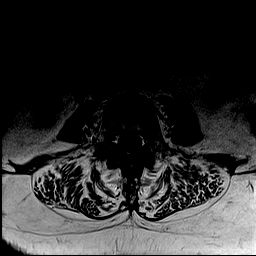
[im 15/37]
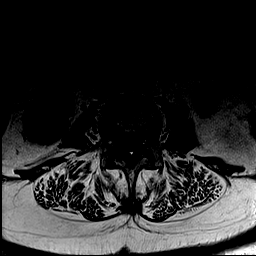
[im 17/37]
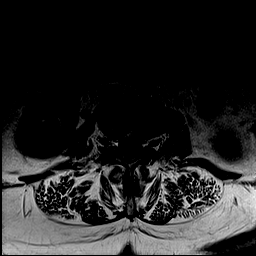
[im 20/37]
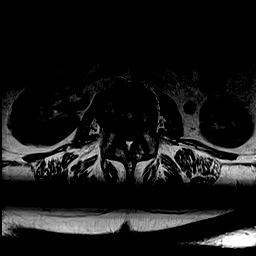
[im 22/37]
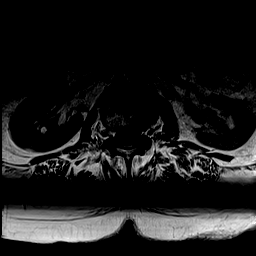
[im 27/37]
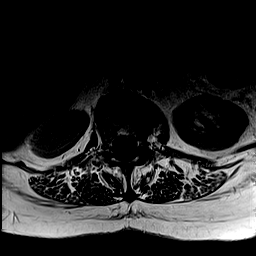
[im 32/37]
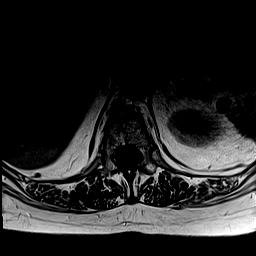
[im 37/37]
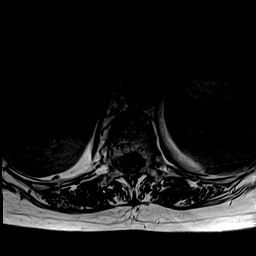

[Series 7: T2 · axial · 4.0mm · 0.78mm/px · z∈[-47,+161]mm · 16 of 37 slices shown (2 of 2)]
[im 1/37]
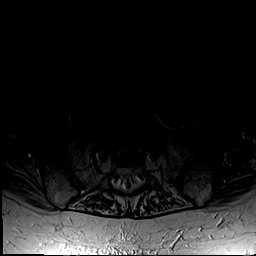
[im 3/37]
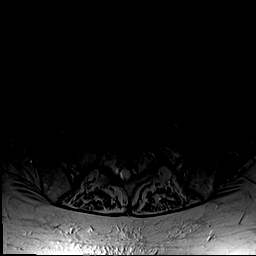
[im 5/37]
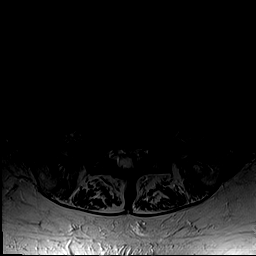
[im 8/37]
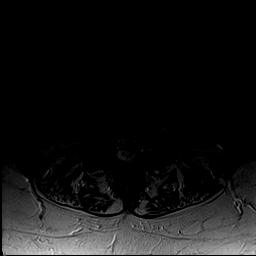
[im 10/37]
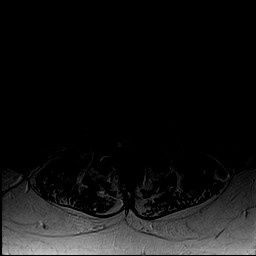
[im 13/37]
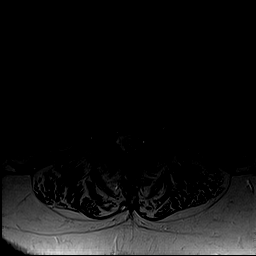
[im 15/37]
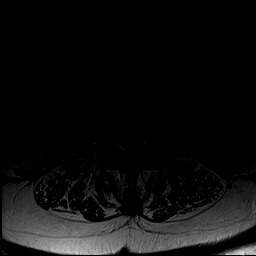
[im 17/37]
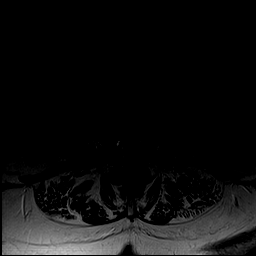
[im 20/37]
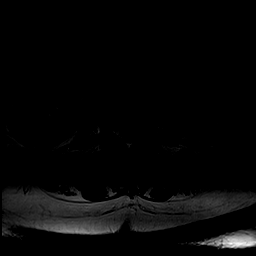
[im 22/37]
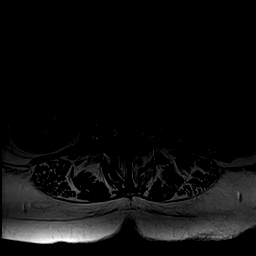
[im 25/37]
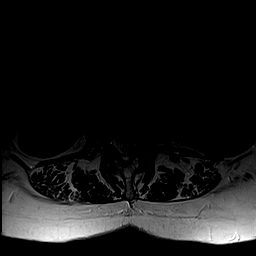
[im 27/37]
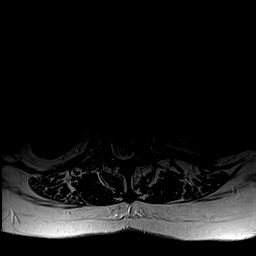
[im 29/37]
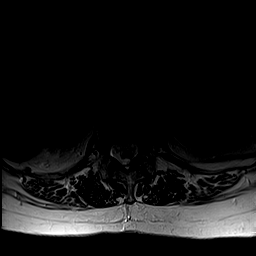
[im 32/37]
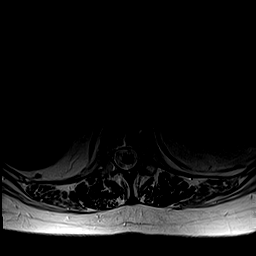
[im 34/37]
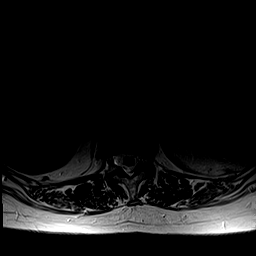
[im 37/37]
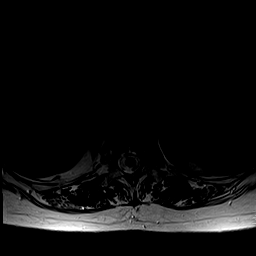

[45 of 48 positions shown; findings below may reference images not displayed]

FINDINGS: Segmentation:  Standard

Alignment:  Grade 1 anterolisthesis at L4-5

Vertebrae: Compression deformity of L1 with 25% height loss and no
marrow edema. Otherwise normal bone marrow signal.

Conus medullaris and cauda equina: Conus extends to the L2 level.
Conus and cauda equina appear normal.

Paraspinal and other soft tissues: Negative

Disc levels:

T9-10 and T10-11 are imaged only in the sagittal plane. There is
disc space narrowing without stenosis.

T11-12: Disc desiccation with minimal bulge.  No stenosis.

T12-L1: Disc bulge with endplate spurring. No spinal canal stenosis.
Mild right and moderate left foraminal stenosis.

L1-L2: Disc bulge with endplate spurring. Normal facets. There is no
spinal canal stenosis. Mild right and severe left neural foraminal
stenosis.

L2-L3: Thank small disc bulge with endplate spurring, unchanged.
There is no spinal canal stenosis. Unchanged mild right neural
foraminal stenosis.

L3-L4: Right asymmetric disc bulge with moderate facet hypertrophy.
There is no spinal canal stenosis. Unchanged mild right neural
foraminal stenosis.

L4-L5: Severe facet hypertrophy with grade 1 anterolisthesis,
unchanged. There is no spinal canal stenosis. Slightly worsened
moderate left neural foraminal stenosis.

L5-S1: Intermediate central disc protrusion, slightly larger on the
previous study. There is no spinal canal stenosis. Unchanged
moderate bilateral neural foraminal stenosis.

Visualized sacrum: Normal.
IMPRESSION: 1. Slight worsening of moderate left L4-5 neural foraminal stenosis
due to combination of disc bulge and facet arthrosis.
2. Unchanged moderate-to-severe bilateral L5-S1 neural foraminal
stenosis.
3. Unchanged severe left L1-2 neural foraminal stenosis.
4. No spinal canal stenosis.

## 2022-05-23 ENCOUNTER — Telehealth (HOSPITAL_COMMUNITY): Payer: Self-pay | Admitting: Cardiovascular Disease

## 2022-05-23 NOTE — Telephone Encounter (Signed)
I called patient to schedule her Myoview. She has decided not to have the test. She is wondering what exactly is the need and has some questions still regarding the need for the test.  I told her that I would pass this information to the RN and let her call the patient to explain the necessity of the test to help her be more clear. An Inbasket messge was sent to the RN in [place] for future contact with patient.  Thank you so much! Order will be removed from the active nuc WQ and if patient decides to have we will reinstate the order.

## 2022-05-30 ENCOUNTER — Telehealth: Payer: Self-pay | Admitting: Cardiovascular Disease

## 2022-05-30 NOTE — Telephone Encounter (Signed)
I spoke with patient and reviewed stress test instructions with her.

## 2022-05-30 NOTE — Telephone Encounter (Signed)
Patient calling for any medication instructions for her stress test.

## 2022-06-05 ENCOUNTER — Ambulatory Visit (HOSPITAL_COMMUNITY): Payer: Medicare Other | Attending: Cardiovascular Disease

## 2022-06-05 DIAGNOSIS — I10 Essential (primary) hypertension: Secondary | ICD-10-CM | POA: Insufficient documentation

## 2022-06-05 DIAGNOSIS — I493 Ventricular premature depolarization: Secondary | ICD-10-CM | POA: Diagnosis not present

## 2022-06-05 DIAGNOSIS — E119 Type 2 diabetes mellitus without complications: Secondary | ICD-10-CM | POA: Insufficient documentation

## 2022-06-05 DIAGNOSIS — I350 Nonrheumatic aortic (valve) stenosis: Secondary | ICD-10-CM | POA: Diagnosis not present

## 2022-06-05 DIAGNOSIS — Z79899 Other long term (current) drug therapy: Secondary | ICD-10-CM | POA: Diagnosis not present

## 2022-06-05 DIAGNOSIS — E782 Mixed hyperlipidemia: Secondary | ICD-10-CM | POA: Diagnosis present

## 2022-06-05 DIAGNOSIS — Z136 Encounter for screening for cardiovascular disorders: Secondary | ICD-10-CM | POA: Insufficient documentation

## 2022-06-05 DIAGNOSIS — Z7984 Long term (current) use of oral hypoglycemic drugs: Secondary | ICD-10-CM | POA: Diagnosis not present

## 2022-06-05 DIAGNOSIS — R002 Palpitations: Secondary | ICD-10-CM | POA: Diagnosis not present

## 2022-06-05 LAB — MYOCARDIAL PERFUSION IMAGING
LV dias vol: 61 mL (ref 46–106)
LV sys vol: 27 mL
Nuc Stress EF: 56 %
Peak HR: 109 {beats}/min
Rest HR: 62 {beats}/min
Rest Nuclear Isotope Dose: 10.2 mCi
SDS: 2
SRS: 2
SSS: 4
ST Depression (mm): 0 mm
Stress Nuclear Isotope Dose: 31.7 mCi
TID: 0.85

## 2022-06-05 MED ORDER — TECHNETIUM TC 99M TETROFOSMIN IV KIT
10.2000 | PACK | Freq: Once | INTRAVENOUS | Status: AC | PRN
  Administered 2022-06-05: 10.2 via INTRAVENOUS

## 2022-06-05 MED ORDER — REGADENOSON 0.4 MG/5ML IV SOLN
0.4000 mg | Freq: Once | INTRAVENOUS | Status: AC
  Administered 2022-06-05: 0.4 mg via INTRAVENOUS

## 2022-06-05 MED ORDER — TECHNETIUM TC 99M TETROFOSMIN IV KIT
31.7000 | PACK | Freq: Once | INTRAVENOUS | Status: AC | PRN
  Administered 2022-06-05: 31.7 via INTRAVENOUS

## 2022-07-19 ENCOUNTER — Telehealth: Payer: Self-pay | Admitting: Internal Medicine

## 2022-07-19 NOTE — Telephone Encounter (Signed)
Pt stated that she started having Bright Red Blood mixed in her stool yesterday morning and some again this Morning. Pt states that she feels great and this same thing happened to her  a couple years ago and Dr. Carlean Purl notified her to monitor it for a couple days: Pt stated that she feels like it has resolved: Pt was notified to monitor her BM and if she has any more rectal bleeding then to call our office and we could get her an office visit:  Pt verbalized understanding with all questions answered.

## 2022-07-19 NOTE — Telephone Encounter (Signed)
Patient called states she is having rectal bleeding, and would like to speak to a nurse to what to do. Requesting a call back. Please call to advise.

## 2022-07-20 NOTE — Telephone Encounter (Signed)
Pt stated that she had one soft brown BM this am and has had 12 episodes of rectal bleeding with BRB  since then. Pt denies any SOB, dizziness, chest pain,or abdominal pain: Pt was notified to go to the ED for evaluation and treatment: Pt verbalized understanding with all questions answered.

## 2022-07-20 NOTE — Telephone Encounter (Signed)
Inbound call from patient requesting  a call back due to rectal bleeding. Patient states she would like to be seen at the office but the next available is too far out. Please advise.

## 2022-07-24 ENCOUNTER — Encounter (HOSPITAL_COMMUNITY): Payer: Self-pay

## 2022-07-24 ENCOUNTER — Ambulatory Visit (HOSPITAL_COMMUNITY)
Admission: EM | Admit: 2022-07-24 | Discharge: 2022-07-24 | Disposition: A | Discharge status: Home / Self Care | Payer: Medicare Other | Source: Home / Self Care | Attending: Family Medicine | Admitting: Family Medicine

## 2022-07-24 ENCOUNTER — Inpatient Hospital Stay (HOSPITAL_BASED_OUTPATIENT_CLINIC_OR_DEPARTMENT_OTHER)
Admission: EM | Admit: 2022-07-24 | Discharge: 2022-07-28 | DRG: 378 | Disposition: A | Discharge status: Home / Self Care | Payer: Medicare Other | Attending: Internal Medicine | Admitting: Internal Medicine

## 2022-07-24 ENCOUNTER — Other Ambulatory Visit: Payer: Self-pay

## 2022-07-24 DIAGNOSIS — K625 Hemorrhage of anus and rectum: Secondary | ICD-10-CM | POA: Insufficient documentation

## 2022-07-24 DIAGNOSIS — K922 Gastrointestinal hemorrhage, unspecified: Secondary | ICD-10-CM | POA: Diagnosis present

## 2022-07-24 DIAGNOSIS — Z79899 Other long term (current) drug therapy: Secondary | ICD-10-CM

## 2022-07-24 DIAGNOSIS — K5733 Diverticulitis of large intestine without perforation or abscess with bleeding: Secondary | ICD-10-CM | POA: Diagnosis not present

## 2022-07-24 DIAGNOSIS — E0590 Thyrotoxicosis, unspecified without thyrotoxic crisis or storm: Secondary | ICD-10-CM | POA: Diagnosis present

## 2022-07-24 DIAGNOSIS — I10 Essential (primary) hypertension: Secondary | ICD-10-CM | POA: Diagnosis present

## 2022-07-24 DIAGNOSIS — K648 Other hemorrhoids: Secondary | ICD-10-CM | POA: Diagnosis present

## 2022-07-24 DIAGNOSIS — E785 Hyperlipidemia, unspecified: Secondary | ICD-10-CM | POA: Diagnosis present

## 2022-07-24 DIAGNOSIS — M810 Age-related osteoporosis without current pathological fracture: Secondary | ICD-10-CM | POA: Diagnosis present

## 2022-07-24 DIAGNOSIS — D509 Iron deficiency anemia, unspecified: Secondary | ICD-10-CM | POA: Diagnosis present

## 2022-07-24 DIAGNOSIS — D62 Acute posthemorrhagic anemia: Secondary | ICD-10-CM | POA: Diagnosis present

## 2022-07-24 DIAGNOSIS — K219 Gastro-esophageal reflux disease without esophagitis: Secondary | ICD-10-CM | POA: Diagnosis present

## 2022-07-24 DIAGNOSIS — Z825 Family history of asthma and other chronic lower respiratory diseases: Secondary | ICD-10-CM

## 2022-07-24 DIAGNOSIS — K5732 Diverticulitis of large intestine without perforation or abscess without bleeding: Secondary | ICD-10-CM | POA: Insufficient documentation

## 2022-07-24 DIAGNOSIS — Z7984 Long term (current) use of oral hypoglycemic drugs: Secondary | ICD-10-CM

## 2022-07-24 DIAGNOSIS — I9589 Other hypotension: Secondary | ICD-10-CM | POA: Diagnosis not present

## 2022-07-24 DIAGNOSIS — Z8249 Family history of ischemic heart disease and other diseases of the circulatory system: Secondary | ICD-10-CM

## 2022-07-24 DIAGNOSIS — D649 Anemia, unspecified: Secondary | ICD-10-CM

## 2022-07-24 DIAGNOSIS — K589 Irritable bowel syndrome without diarrhea: Secondary | ICD-10-CM | POA: Diagnosis present

## 2022-07-24 DIAGNOSIS — Z87891 Personal history of nicotine dependence: Secondary | ICD-10-CM

## 2022-07-24 DIAGNOSIS — R1032 Left lower quadrant pain: Secondary | ICD-10-CM | POA: Insufficient documentation

## 2022-07-24 DIAGNOSIS — Z90710 Acquired absence of both cervix and uterus: Secondary | ICD-10-CM

## 2022-07-24 DIAGNOSIS — E1140 Type 2 diabetes mellitus with diabetic neuropathy, unspecified: Secondary | ICD-10-CM | POA: Insufficient documentation

## 2022-07-24 DIAGNOSIS — I700 Atherosclerosis of aorta: Secondary | ICD-10-CM | POA: Diagnosis present

## 2022-07-24 LAB — BASIC METABOLIC PANEL
Anion gap: 9 (ref 5–15)
BUN: 13 mg/dL (ref 8–23)
CO2: 24 mmol/L (ref 22–32)
Calcium: 8.9 mg/dL (ref 8.9–10.3)
Chloride: 105 mmol/L (ref 98–111)
Creatinine, Ser: 0.71 mg/dL (ref 0.44–1.00)
GFR, Estimated: >60 mL/min (ref >60)
Glucose, Bld: 114 mg/dL — ABNORMAL HIGH (ref 70–99)
Potassium: 4 mmol/L (ref 3.5–5.1)
Sodium: 138 mmol/L (ref 135–145)

## 2022-07-24 LAB — COMPREHENSIVE METABOLIC PANEL
ALT: 16 U/L (ref 0–44)
AST: 21 U/L (ref 15–41)
Albumin: 4.3 g/dL (ref 3.5–5.0)
Alkaline Phosphatase: 52 U/L (ref 38–126)
Anion gap: 10 (ref 5–15)
BUN: 15 mg/dL (ref 8–23)
CO2: 27 mmol/L (ref 22–32)
Calcium: 9.4 mg/dL (ref 8.9–10.3)
Chloride: 104 mmol/L (ref 98–111)
Creatinine, Ser: 0.66 mg/dL (ref 0.44–1.00)
GFR, Estimated: >60 mL/min (ref >60)
Glucose, Bld: 113 mg/dL — ABNORMAL HIGH (ref 70–99)
Potassium: 4.1 mmol/L (ref 3.5–5.1)
Sodium: 141 mmol/L (ref 135–145)
Total Bilirubin: 0.2 mg/dL — ABNORMAL LOW (ref 0.3–1.2)
Total Protein: 7.1 g/dL (ref 6.5–8.1)

## 2022-07-24 LAB — CBC WITH DIFFERENTIAL/PLATELET
Abs Immature Granulocytes: 0.01 10*3/uL (ref 0.00–0.07)
Basophils Absolute: 0 10*3/uL (ref 0.0–0.1)
Basophils Relative: 1 %
Eosinophils Absolute: 0.1 10*3/uL (ref 0.0–0.5)
Eosinophils Relative: 3 %
HCT: 25.3 % — ABNORMAL LOW (ref 36.0–46.0)
Hemoglobin: 8.2 g/dL — ABNORMAL LOW (ref 12.0–15.0)
Immature Granulocytes: 0 %
Lymphocytes Relative: 19 %
Lymphs Abs: 0.8 10*3/uL (ref 0.7–4.0)
MCH: 31.5 pg (ref 26.0–34.0)
MCHC: 32.4 g/dL (ref 30.0–36.0)
MCV: 97.3 fL (ref 80.0–100.0)
Monocytes Absolute: 0.5 10*3/uL (ref 0.1–1.0)
Monocytes Relative: 10 %
Neutro Abs: 3 10*3/uL (ref 1.7–7.7)
Neutrophils Relative %: 67 %
Platelets: 229 10*3/uL (ref 150–400)
RBC: 2.6 MIL/uL — ABNORMAL LOW (ref 3.87–5.11)
RDW: 13.8 % (ref 11.5–15.5)
WBC: 4.4 10*3/uL (ref 4.0–10.5)
nRBC: 0 % (ref 0.0–0.2)

## 2022-07-24 LAB — OCCULT BLOOD X 1 CARD TO LAB, STOOL: Fecal Occult Bld: POSITIVE — AB

## 2022-07-24 LAB — CBC
HCT: 27.5 % — ABNORMAL LOW (ref 36.0–46.0)
Hemoglobin: 8.9 g/dL — ABNORMAL LOW (ref 12.0–15.0)
MCH: 31.2 pg (ref 26.0–34.0)
MCHC: 32.4 g/dL (ref 30.0–36.0)
MCV: 96.5 fL (ref 80.0–100.0)
Platelets: 249 10*3/uL (ref 150–400)
RBC: 2.85 MIL/uL — ABNORMAL LOW (ref 3.87–5.11)
RDW: 13.7 % (ref 11.5–15.5)
WBC: 4.9 10*3/uL (ref 4.0–10.5)
nRBC: 0 % (ref 0.0–0.2)

## 2022-07-24 MED ORDER — AMOXICILLIN-POT CLAVULANATE 875-125 MG PO TABS: 1.0000 | ORAL_TABLET | Freq: Two times a day (BID) | ORAL | 0 refills | Status: DC

## 2022-07-24 MED ORDER — SODIUM CHLORIDE 0.9 % IV BOLUS
1000.0000 mL | Freq: Once | INTRAVENOUS | Status: AC
  Administered 2022-07-24: 1000 mL via INTRAVENOUS

## 2022-07-24 NOTE — ED Provider Notes (Signed)
[facility]    CSN: [id number] Arrival date & time: 07/24/22  1114      History   Chief Complaint Chief Complaint  Patient presents with   Abdominal Pain    HPI Vanessa Boyd is a 81 y.o. female.   Patient is here for rectal bleeding.  Started about 1 week ago.  She did call her GI and can't be seen until end of October.  She has had this in the past, but stopped on its own. Bleed for about a day, then stopped for 2-3 days, returned.  Happened again,and stopped again.   She last noted it at 2:30 am.  It happens randomly.  May happen with BM, but not necessarily.   She may feel it, wipe, and then notice it again hrs later. Is not saturating any underwear or pad.  No diarrhea or constipation. Last BM was several days ago, normal.  She is not eating a lot, decreased appetite.  She did eat yesterday normally.  She does have very slight pain at the LLQ.  That is sort of constant.  About a 2/10, if that.  No medications taken for this.  No h/o of diverticulitis.  Her last colonoscopy showed severe diverticulosis.  No fevers/chills.  No urinary symptoms noted.  She just drank water, last meal was 5pm yesterday.         Past Medical History:  Diagnosis Date   Abnormal bone density screening    Diverticulitis    Graves' disease    Hyperlipidemia    Hypertension    Hyperthyroidism    Hypoglycemia, unspecified    IBS (irritable bowel syndrome)    Obesity    Osteoporosis    Palpitations    Post-menopausal    Spinal stenosis 2017   Tachycardia    Tobacco use     Patient Active Problem List   Diagnosis Date Noted   Spondylolisthesis of lumbar region 06/08/2020   HYPERTHYROIDISM 12/13/2010   HYPOGLYCEMIA, UNSPECIFIED 12/13/2010   UNSPECIFIED ESSENTIAL HYPERTENSION 12/13/2010   OSTEOPOROSIS 12/13/2010   PALPITATIONS 12/13/2010    Past Surgical History:  Procedure Laterality Date   ABDOMINAL HYSTERECTOMY     APPENDECTOMY     BACK SURGERY  05/2021    EYE SURGERY     bilateral cataract surgery   OOPHORECTOMY     orif left elbow     SHOULDER OPEN ROTATOR CUFF REPAIR     TONSILLECTOMY      OB History     Gravida  3   Para  3   Term      Preterm      AB      Living  3      SAB      IAB      Ectopic      Multiple      Live Births               Home Medications    Prior to Admission medications   Medication Sig Start Date End Date Taking? Authorizing Provider  ascorbic acid (VITAMIN C) 500 MG tablet Take 500 mg by mouth daily.    [provider]  Calcium Carbonate-Vitamin D 600-400 MG-UNIT tablet Take 1 tablet by mouth 2 (two) times daily.     [provider]  Cholecalciferol (VITAMIN D3) 50 MCG (2000 UT) capsule Take 2,000 Units by mouth daily.     [provider]  cyclobenzaprine (FLEXERIL) 10 MG tablet Take 1  tablet (10 mg total) by mouth 3 (three) times daily as needed for muscle spasms. 06/11/20   Dawley, Troy C, DO  denosumab (PROLIA) 60 MG/ML SOLN injection Inject 60 mg into the skin every 6 (six) months. Administer in upper arm, thigh, or abdomen    [provider]  gabapentin (NEURONTIN) 600 MG tablet Take 600 mg by mouth 2 (two) times daily. 03/12/22   [provider]  metFORMIN (GLUCOPHAGE-XR) 500 MG 24 hr tablet Take 500 mg by mouth daily with breakfast.     [provider]  Multiple Vitamins-Minerals (CENTRUM SILVER PO) Take 1 tablet by mouth daily.      [provider]  Naproxen Sodium 220 MG CAPS Take 440 mg by mouth daily as needed (pain,headache).     [provider]  omeprazole (PRILOSEC) 20 MG capsule TAKE 1 CAPSULE(20 MG) BY MOUTH DAILY 02/27/22   Iva Boop, MD  OZEMPIC, 1 MG/DOSE, 4 MG/3ML SOPN Inject 1 mg into the skin once a week. 03/23/22   [provider]  simvastatin (ZOCOR) 40 MG tablet Take 40 mg by mouth daily. 10/06/16   [provider]    Family History Family History  Problem Relation Age  of Onset   COPD Mother    Hyperlipidemia Mother    Hypertension Sister    Hyperlipidemia Sister    Diabetes Neg Hx    Heart disease Neg Hx    Colon cancer Neg Hx    Rectal cancer Neg Hx    Stomach cancer Neg Hx    Esophageal cancer Neg Hx     Social History Social History   Tobacco Use   Smoking status: Former    Years: 20.00    Types: Cigarettes    Quit date: 10/22/1998    Years since quitting: 23.7   Smokeless tobacco: Never  Vaping Use   Vaping Use: Never used  Substance Use Topics   Alcohol use: Yes    Alcohol/week: 2.0 standard drinks of alcohol    Types: 2 Standard drinks or equivalent per week    Comment: occasionally   Drug use: No     Allergies   5-alpha reductase inhibitors, Codeine, and Hydromorphone   Review of Systems Review of Systems  Constitutional:  Positive for appetite change.  HENT: Negative.    Respiratory: Negative.    Cardiovascular: Negative.   Gastrointestinal:  Positive for abdominal pain and anal bleeding. Negative for constipation and diarrhea.  Genitourinary: Negative.   Skin: Negative.   Psychiatric/Behavioral: Negative.       Physical Exam Triage Vital Signs ED Triage Vitals  Enc Vitals Group     BP 07/24/22 1133 120/81     Pulse Rate 07/24/22 1133 79     Resp 07/24/22 1133 12     Temp 07/24/22 1133 98 F (36.7 C)     Temp Source 07/24/22 1133 Oral     SpO2 07/24/22 1133 98 %     Weight --      Height --      Head Circumference --      Peak Flow --      Pain Score 07/24/22 1131 2     Pain Loc --      Pain Edu? --      Excl. in GC? --    No data found.  Updated Vital Signs BP 120/81 (BP Location: Left Arm)   Pulse 79   Temp 98 F (36.7 C) (Oral)   Resp 12  SpO2 98%   Visual Acuity Right Eye Distance:   Left Eye Distance:   Bilateral Distance:    Right Eye Near:   Left Eye Near:    Bilateral Near:     Physical Exam Constitutional:      Appearance: She is well-developed.  Cardiovascular:     Rate  and Rhythm: Normal rate and regular rhythm.  Pulmonary:     Effort: Pulmonary effort is normal.     Breath sounds: Normal breath sounds.  Abdominal:     General: Bowel sounds are normal.     Palpations: Abdomen is soft.     Tenderness: There is abdominal tenderness in the left lower quadrant. There is no guarding or rebound.  Skin:    General: Skin is warm.  Neurological:     General: No focal deficit present.     Mental Status: She is alert.  Psychiatric:        Mood and Affect: Mood normal.      UC Treatments / Results  Labs (all labs ordered are listed, but only abnormal results are displayed) Labs Reviewed  BASIC METABOLIC PANEL  CBC WITH DIFFERENTIAL/PLATELET    EKG   Radiology No results found.  Procedures Procedures (including critical care time)  Medications Ordered in UC Medications - No data to display  Initial Impression / Assessment and Plan / UC Course  I have reviewed the triage vital signs and the nursing notes.  Pertinent labs & imaging results that were available during my care of the patient were reviewed by me and considered in my medical decision making (see chart for details).   Final Clinical Impressions(s) / UC Diagnoses   Final diagnoses:  Left lower quadrant abdominal pain  Rectal bleeding  Diverticulitis of colon     Discharge Instructions      You were seen today for abdominal pain and rectal bleeding.  I am treating you today for diverticulitis.  I have sent out augmentin to take twice/day x 10 days.  Please take with food to avoid upset stomach.  I have also ordered blood work today.  This will be resulted by later today.  If there is anything concerning we will notify you.  If you have worsening pain, worsening bleeding, or develop fever, chills or nausea or vomiting then please go to the ER ASAP.     ED Prescriptions     Medication Sig Dispense Auth. Provider   amoxicillin-clavulanate (AUGMENTIN) 875-125 MG tablet   (Status: Discontinued) Take 1 tablet by mouth every 12 (twelve) hours. 14 tablet Yula Crotwell, MD   amoxicillin-clavulanate (AUGMENTIN) 875-125 MG tablet Take 1 tablet by mouth every 12 (twelve) hours for 10 days. 20 tablet Rondel Oh, MD      PDMP not reviewed this encounter.   Rondel Oh, MD 07/24/22 1214

## 2022-07-24 NOTE — ED Triage Notes (Signed)
Pt is here fr abdominal pain on and off x1wk with some bleeding

## 2022-07-24 NOTE — ED Notes (Signed)
Rectal bleeding intermittent over the last week. No current pain or while the bleeding has been on going. No N/V. Hx of diverticulum.

## 2022-07-24 NOTE — ED Notes (Signed)
Provider informed that the Pt wiped her rectum after using the restroom and there was what appeared to be blood on toilet paper. Provider requested that, the toilet paper be rubbed on the hemacult card and for that to be tested.

## 2022-07-24 NOTE — ED Provider Notes (Signed)
[name] Note   CSN: [id number] Arrival date & time: 07/24/22  1837     History  Chief Complaint  Patient presents with   Abnormal Lab    Vanessa Boyd is a 81 y.o. female.  Labs obtained independently viewed interpreted by me.  Patient is positive with a fecal occult blood test.  The history is provided by the patient and medical records. No language interpreter was used.  Abnormal Lab    Vanessa Boyd is a 81yo female with PMHx of diverticulitis who presents to ED for abnormal Hgb. Pt states that "about a week ago" she noticed blood in her stool. She says that she has had 2-3 BMs per day since about Wednesday, she believes, and says that the bowl is full of blood and and what she believes may be clots. She states that she also has lower left abdominal pain that she states as 2/10 and constant. She denies dyschezia or rectal pain. Her last colonoscopy was in 2020, which showed diverticular changes. She denies fever, chills, nausea, vomit, ShOB, or fatigue.   Home Medications Prior to Admission medications   Medication Sig Start Date End Date Taking? Authorizing Provider  amoxicillin-clavulanate (AUGMENTIN) 875-125 MG tablet Take 1 tablet by mouth every 12 (twelve) hours for 10 days. 07/24/22 08/03/22  Piontek, Junie Panning, MD  ascorbic acid (VITAMIN C) 500 MG tablet Take 500 mg by mouth daily.    [provider]  Calcium Carbonate-Vitamin D 600-400 MG-UNIT tablet Take 1 tablet by mouth 2 (two) times daily.     [provider]  Cholecalciferol (VITAMIN D3) 50 MCG (2000 UT) capsule Take 2,000 Units by mouth daily.     [provider]  cyclobenzaprine (FLEXERIL) 10 MG tablet Take 1 tablet (10 mg total) by mouth 3 (three) times daily as needed for muscle spasms. 06/11/20   Dawley, Troy C, DO  denosumab (PROLIA) 60 MG/ML SOLN injection Inject 60 mg into the skin every 6 (six) months. Administer in upper arm, thigh, or abdomen     [provider]  gabapentin (NEURONTIN) 600 MG tablet Take 600 mg by mouth 2 (two) times daily. 03/12/22   [provider]  metFORMIN (GLUCOPHAGE-XR) 500 MG 24 hr tablet Take 500 mg by mouth daily with breakfast.     [provider]  Multiple Vitamins-Minerals (CENTRUM SILVER PO) Take 1 tablet by mouth daily.      [provider]  Naproxen Sodium 220 MG CAPS Take 440 mg by mouth daily as needed (pain,headache).     [provider]  omeprazole (PRILOSEC) 20 MG capsule TAKE 1 CAPSULE(20 MG) BY MOUTH DAILY 02/27/22   Gatha Mayer, MD  OZEMPIC, 1 MG/DOSE, 4 MG/3ML SOPN Inject 1 mg into the skin once a week. 03/23/22   [provider]  simvastatin (ZOCOR) 40 MG tablet Take 40 mg by mouth daily. 10/06/16   [provider]      Allergies    5-alpha reductase inhibitors, Codeine, and Hydromorphone    Review of Systems   Review of Systems  All other systems reviewed and are negative.   Physical Exam Updated Vital Signs BP (!) 121/90 (BP Location: Right Arm)   Pulse 80   Temp 98.2 F (36.8 C) (Oral)   Resp 16   Ht 4\' 11"  (1.499 m)   Wt 59.9 kg   SpO2 99%   BMI 26.66 kg/m  Physical Exam Vitals and nursing note reviewed.  Constitutional:  General: She is not in acute distress.    Appearance: She is well-developed.  HENT:     Head: Atraumatic.  Eyes:     Conjunctiva/sclera: Conjunctivae normal.  Cardiovascular:     Rate and Rhythm: Normal rate and regular rhythm.     Pulses: Normal pulses.     Heart sounds: Normal heart sounds.  Pulmonary:     Effort: Pulmonary effort is normal.  Abdominal:     Palpations: Abdomen is soft.     Tenderness: There is no abdominal tenderness.  Genitourinary:    Comments: Chaperone present during exam.  Normal rectal tone, no obvious mass, maroon-colored stool on glove, no thrombosed hemorrhoid no anal fissure, no perirectal abscess. Musculoskeletal:     Cervical back: Neck supple.   Skin:    Coloration: Skin is pale.     Findings: No rash.  Neurological:     Mental Status: She is alert.  Psychiatric:        Mood and Affect: Mood normal.     ED Results / Procedures / Treatments   Labs (all labs ordered are listed, but only abnormal results are displayed) Labs Reviewed  COMPREHENSIVE METABOLIC PANEL - Abnormal; Notable for the following components:      Result Value   Glucose, Bld 113 (*)    Total Bilirubin 0.2 (*)    All other components within normal limits  CBC - Abnormal; Notable for the following components:   RBC 2.85 (*)    Hemoglobin 8.9 (*)    HCT 27.5 (*)    All other components within normal limits  OCCULT BLOOD X 1 CARD TO LAB, STOOL - Abnormal; Notable for the following components:   Fecal Occult Bld POSITIVE (*)    All other components within normal limits  PROTIME-INR    EKG None  Radiology No results found.  Procedures Procedures    Medications Ordered in ED Medications  sodium chloride 0.9 % bolus 1,000 mL (has no administration in time range)    ED Course/ Medical Decision Making/ A&P                           Medical Decision Making Amount and/or Complexity of Data Reviewed Labs: ordered.   BP (!) 121/90 (BP Location: Right Arm)   Pulse 80   Temp 98.2 F (36.8 C) (Oral)   Resp 16   Ht 4\' 11"  (1.499 m)   Wt 59.9 kg   SpO2 99%   BMI 26.66 kg/m   33:68 PM 81 year old female significant history of diverticulosis presenting complaining of rectal bleeding.  Patient report for the past week she has had recurrent rectal bleeding.  States she sees moderate amount of bleeding per rectum several times daily.  No associated rectal pain.  She does not endorse any significant chest pain or shortness of breath.  She endorsed occasionally feeling weak but not always.  Rectal bleeding sometimes stop for few days and returns.  Bleeding sometimes mixed with stool and sometimes by itself.  She tries to follow-up with a GI specialist  but states her appointment is not until December.  She went to urgent care today and blood work was obtained.  She was prescribed antibiotic for potential diverticulitis.  She was then reach out over the phone stating that she needs to come to the ER due to her low hemoglobin.  Patient also endorsed she is not eating much because she is afraid to eat as  it can cause bowel movement.  She mention minimal pain to her left lower abdomen.  No fever or chills no urinary symptoms.  On exam this is a well-appearing female appears to be in no acute discomfort.  Heart lung sounds normal.  Abdomen is soft and nontender.  Chaperone present during exam.  Rectal exam notable for maroon-colored stool on glove without any obvious anal fissure, obvious rectal mass, periapical abscess, or thrombosed hemorrhoid.  Labs obtained including review and interpreted by me. Current hemoglobin is 8.9.  It was 8.3 earlier today at the urgent care center.  Patient has a hemoglobin of 13.9 approximately 3 months ago.  Her fecal occult blood test is positive.  Her electrolyte panels are reassuring.  I discussed care with Dr. Tamera Punt who felt with low hemoglobin and ongoing bleeding patient should be admitted for further care. I will send an Epic Secure message to GI specialist to make them aware and will consult medicine for admission.  Care discussed with Dr. Tamera Punt.    10:18 PM Appreciate consultation from [facility] hospitalist, Dr. Alcario Drought, who agrees to see and will admit patient for further care.  I have also reached out to [facility] and got in touch with Dr. Tarri Glenn who will have patient placed on the list to be seen tomorrow.  This patient presents to the ED for concern of rectal bleeding, this involves an extensive number of treatment options, and is a complaint that carries with it a high risk of complications and morbidity.  The differential diagnosis includes diverticulitis, colitis, internal hemorrhoid, AVM, malignancy, Crohn's,  UC, anal fissure  Co morbidities that complicate the patient evaluation diverticulosis Additional history obtained:  Additional history obtained from [facility] External records from outside source obtained and reviewed including EMR  Lab Tests:  I Ordered, and personally interpreted labs.  The pertinent results include:  as above  Cardiac Monitoring:  The patient was maintained on a cardiac monitor.  I personally viewed and interpreted the cardiac monitored which showed an underlying rhythm of: NSR  Medicines ordered and prescription drug management:  I ordered medication including IVF  for blood loss Reevaluation of the patient after these medicines showed that the patient improved I have reviewed the patients home medicines and have made adjustments as needed  Test Considered: as above  Critical Interventions: IVF  Consultations Obtained:  I requested consultation with the GI specialist,  and discussed lab and imaging findings as well as pertinent plan - they recommend: admission  Problem List / ED Course: rectal bleed  Reevaluation:  After the interventions noted above, I reevaluated the patient and found that they have :stayed the same  Social Determinants of Health: tobacco use  Dispostion:  After consideration of the diagnostic results and the patients response to treatment, I feel that the patent would benefit from admission .         Final Clinical Impression(s) / ED Diagnoses Final diagnoses:  Lower GI bleed  Anemia, unspecified type    Rx / DC Orders ED Discharge Orders     None         Domenic Moras, PA-C 07/24/22 2219    Malvin Johns, MD 07/24/22 2241

## 2022-07-24 NOTE — ED Triage Notes (Signed)
Pt arrived POV, ambulatory, caox4. Pt reports she went to UC this morning after having bright red blood in the stool for the past week. Pt denies SOB, N/V, fever.

## 2022-07-24 NOTE — Discharge Instructions (Addendum)
You were seen today for abdominal pain and rectal bleeding.  I am treating you today for diverticulitis.  I have sent out augmentin to take twice/day x 10 days.  Please take with food to avoid upset stomach.  I have also ordered blood work today.  This will be resulted by later today.  If there is anything concerning we will notify you.  If you have worsening pain, worsening bleeding, or develop fever, chills or nausea or vomiting then please go to the ER ASAP.

## 2022-07-25 ENCOUNTER — Observation Stay (HOSPITAL_COMMUNITY): Payer: Medicare Other

## 2022-07-25 ENCOUNTER — Encounter (HOSPITAL_COMMUNITY): Payer: Self-pay | Admitting: Internal Medicine

## 2022-07-25 DIAGNOSIS — Z7984 Long term (current) use of oral hypoglycemic drugs: Secondary | ICD-10-CM | POA: Diagnosis not present

## 2022-07-25 DIAGNOSIS — I10 Essential (primary) hypertension: Secondary | ICD-10-CM | POA: Diagnosis not present

## 2022-07-25 DIAGNOSIS — I9589 Other hypotension: Secondary | ICD-10-CM | POA: Diagnosis not present

## 2022-07-25 DIAGNOSIS — K219 Gastro-esophageal reflux disease without esophagitis: Secondary | ICD-10-CM | POA: Diagnosis not present

## 2022-07-25 DIAGNOSIS — K625 Hemorrhage of anus and rectum: Secondary | ICD-10-CM | POA: Diagnosis not present

## 2022-07-25 DIAGNOSIS — Z825 Family history of asthma and other chronic lower respiratory diseases: Secondary | ICD-10-CM | POA: Diagnosis not present

## 2022-07-25 DIAGNOSIS — E1140 Type 2 diabetes mellitus with diabetic neuropathy, unspecified: Secondary | ICD-10-CM

## 2022-07-25 DIAGNOSIS — K922 Gastrointestinal hemorrhage, unspecified: Secondary | ICD-10-CM | POA: Diagnosis present

## 2022-07-25 DIAGNOSIS — Z79899 Other long term (current) drug therapy: Secondary | ICD-10-CM | POA: Diagnosis not present

## 2022-07-25 DIAGNOSIS — D62 Acute posthemorrhagic anemia: Secondary | ICD-10-CM | POA: Diagnosis present

## 2022-07-25 DIAGNOSIS — E785 Hyperlipidemia, unspecified: Secondary | ICD-10-CM | POA: Diagnosis not present

## 2022-07-25 DIAGNOSIS — M810 Age-related osteoporosis without current pathological fracture: Secondary | ICD-10-CM | POA: Diagnosis not present

## 2022-07-25 DIAGNOSIS — K648 Other hemorrhoids: Secondary | ICD-10-CM | POA: Diagnosis not present

## 2022-07-25 DIAGNOSIS — K5733 Diverticulitis of large intestine without perforation or abscess with bleeding: Secondary | ICD-10-CM | POA: Diagnosis not present

## 2022-07-25 DIAGNOSIS — Z87891 Personal history of nicotine dependence: Secondary | ICD-10-CM | POA: Diagnosis not present

## 2022-07-25 DIAGNOSIS — I700 Atherosclerosis of aorta: Secondary | ICD-10-CM | POA: Diagnosis not present

## 2022-07-25 DIAGNOSIS — K589 Irritable bowel syndrome without diarrhea: Secondary | ICD-10-CM | POA: Diagnosis not present

## 2022-07-25 DIAGNOSIS — E0590 Thyrotoxicosis, unspecified without thyrotoxic crisis or storm: Secondary | ICD-10-CM | POA: Diagnosis not present

## 2022-07-25 DIAGNOSIS — D509 Iron deficiency anemia, unspecified: Secondary | ICD-10-CM | POA: Diagnosis not present

## 2022-07-25 DIAGNOSIS — Z90710 Acquired absence of both cervix and uterus: Secondary | ICD-10-CM | POA: Diagnosis not present

## 2022-07-25 DIAGNOSIS — Z8249 Family history of ischemic heart disease and other diseases of the circulatory system: Secondary | ICD-10-CM | POA: Diagnosis not present

## 2022-07-25 LAB — CBC
HCT: 22.1 % — ABNORMAL LOW (ref 36.0–46.0)
Hemoglobin: 7.1 g/dL — ABNORMAL LOW (ref 12.0–15.0)
MCH: 31 pg (ref 26.0–34.0)
MCHC: 32.1 g/dL (ref 30.0–36.0)
MCV: 96.5 fL (ref 80.0–100.0)
Platelets: 231 10*3/uL (ref 150–400)
RBC: 2.29 MIL/uL — ABNORMAL LOW (ref 3.87–5.11)
RDW: 13.7 % (ref 11.5–15.5)
WBC: 5.8 10*3/uL (ref 4.0–10.5)
nRBC: 0 % (ref 0.0–0.2)

## 2022-07-25 LAB — HEMOGLOBIN A1C
Hgb A1c MFr Bld: 5.4 % (ref 4.8–5.6)
Mean Plasma Glucose: 108.28 mg/dL

## 2022-07-25 LAB — HEMOGLOBIN AND HEMATOCRIT, BLOOD
HCT: 23.6 % — ABNORMAL LOW (ref 36.0–46.0)
Hemoglobin: 8 g/dL — ABNORMAL LOW (ref 12.0–15.0)

## 2022-07-25 LAB — BASIC METABOLIC PANEL
Anion gap: 6 (ref 5–15)
BUN: 9 mg/dL (ref 8–23)
CO2: 23 mmol/L (ref 22–32)
Calcium: 8.4 mg/dL — ABNORMAL LOW (ref 8.9–10.3)
Chloride: 112 mmol/L — ABNORMAL HIGH (ref 98–111)
Creatinine, Ser: 0.69 mg/dL (ref 0.44–1.00)
GFR, Estimated: >60 mL/min (ref >60)
Glucose, Bld: 116 mg/dL — ABNORMAL HIGH (ref 70–99)
Potassium: 4.2 mmol/L (ref 3.5–5.1)
Sodium: 141 mmol/L (ref 135–145)

## 2022-07-25 LAB — PREPARE RBC (CROSSMATCH)

## 2022-07-25 MED ORDER — ALBUTEROL SULFATE (2.5 MG/3ML) 0.083% IN NEBU: 2.5000 mg | INHALATION_SOLUTION | Freq: Four times a day (QID) | RESPIRATORY_TRACT | Status: DC | PRN

## 2022-07-25 MED ORDER — ONDANSETRON HCL 4 MG PO TABS: 4.0000 mg | ORAL_TABLET | Freq: Four times a day (QID) | ORAL | Status: DC | PRN

## 2022-07-25 MED ORDER — CYCLOBENZAPRINE HCL 10 MG PO TABS: 10.0000 mg | ORAL_TABLET | Freq: Three times a day (TID) | ORAL | Status: DC | PRN

## 2022-07-25 MED ORDER — ACETAMINOPHEN 325 MG PO TABS: 650.0000 mg | ORAL_TABLET | Freq: Four times a day (QID) | ORAL | Status: DC | PRN

## 2022-07-25 MED ORDER — SODIUM CHLORIDE 0.9% IV SOLUTION: Freq: Once | INTRAVENOUS | Status: AC

## 2022-07-25 MED ORDER — GABAPENTIN 600 MG PO TABS
600.0000 mg | ORAL_TABLET | Freq: Two times a day (BID) | ORAL | Status: DC
  Administered 2022-07-25 – 2022-07-28 (×6): 600 mg via ORAL
  Filled 2022-07-25 (×6): qty 1

## 2022-07-25 MED ORDER — SIMVASTATIN 20 MG PO TABS
40.0000 mg | ORAL_TABLET | Freq: Every day | ORAL | Status: DC
  Administered 2022-07-25 – 2022-07-28 (×4): 40 mg via ORAL
  Filled 2022-07-25 (×4): qty 2

## 2022-07-25 MED ORDER — IOHEXOL 350 MG/ML SOLN
60.0000 mL | Freq: Once | INTRAVENOUS | Status: AC | PRN
  Administered 2022-07-25: 60 mL via INTRAVENOUS

## 2022-07-25 MED ORDER — CALCIUM GLUCONATE-NACL 1-0.675 GM/50ML-% IV SOLN
1.0000 g | Freq: Once | INTRAVENOUS | Status: AC
  Administered 2022-07-25: 1000 mg via INTRAVENOUS
  Filled 2022-07-25: qty 50

## 2022-07-25 MED ORDER — ONDANSETRON HCL 4 MG/2ML IJ SOLN: 4.0000 mg | Freq: Four times a day (QID) | INTRAMUSCULAR | Status: DC | PRN

## 2022-07-25 MED ORDER — PANTOPRAZOLE SODIUM 40 MG PO TBEC
40.0000 mg | DELAYED_RELEASE_TABLET | Freq: Every day | ORAL | Status: DC
  Administered 2022-07-25 – 2022-07-28 (×4): 40 mg via ORAL
  Filled 2022-07-25 (×4): qty 1

## 2022-07-25 MED ORDER — ACETAMINOPHEN 650 MG RE SUPP: 650.0000 mg | Freq: Four times a day (QID) | RECTAL | Status: DC | PRN

## 2022-07-25 NOTE — Consult Note (Signed)
[facility] Consult: 2:04 PM 07/25/2022  LOS: 0 days    Referring Provider: Dr Harvest Forest.    Primary Care Physician:  Donnajean Lopes, MD Primary Gastroenterologist:  Dr. Carlean Purl    Reason for Consultation: Painless hematochezia.     HPI: Vanessa Boyd is a 81 y.o. female.  PMH outlined below.  NIDDM.  Lower GI bleed for couple of days several years ago but did not require admission.  12/2018 colonoscopy   For FOBT positive stool.  Severe sigmoid diverticulosis associated with luminal narrowing which required using pediatric colonoscope. 12/2018 EGD.  Nonbleeding erosive gastropathy.  6 cm hiatal hernia with a few Cameron ulcers, no active bleeding.  H Pylori negative.    About a week ago developed stuttering diverticular bleeding.  Up to 5 episodes daily, some of them very heavy.  Minor associated left lower quadrant crampiness but no significant pain.  No nausea or vomiting.  Called GI but was not able to get an appointment with a provider until the end of October.  Since the bleeding did not stop, though there were days when it did not occur or was very light, she went to [place] emergency yesterday.  Latest episode was noon today.  Infrequent use of Aleve.  Vital signs stable.   Hgb 8.2... 8.9.  It was 13.9 in mid June, nearly 4 months ago.  MCV 96.  Platelets, WBCs normal. CMET unremarkable other than glucose 113.  No family history of GI bleed, colorectal cancer, ulcer disease.  Lives alone in [place].  Occasional alcohol.  Does not smoke.    Past Medical History:  Diagnosis Date   Abnormal bone density screening    Diverticulitis    Graves' disease    Hyperlipidemia    Hypertension    Hyperthyroidism    Hypoglycemia, unspecified    IBS (irritable bowel syndrome)    Obesity     Osteoporosis    Palpitations    Post-menopausal    Spinal stenosis 2017   Tachycardia    Tobacco use     Past Surgical History:  Procedure Laterality Date   ABDOMINAL HYSTERECTOMY     APPENDECTOMY     BACK SURGERY  05/2021   EYE SURGERY     bilateral cataract surgery   OOPHORECTOMY     orif left elbow     SHOULDER OPEN ROTATOR CUFF REPAIR     TONSILLECTOMY      Prior to Admission medications   Medication Sig Start Date End Date Taking? Authorizing Provider  amoxicillin-clavulanate (AUGMENTIN) 875-125 MG tablet Take 1 tablet by mouth every 12 (twelve) hours for 10 days. 07/24/22 08/03/22 Yes Piontek, Erin, MD  ascorbic acid (VITAMIN C) 500 MG tablet Take 500 mg by mouth daily.   Yes [provider]  Cholecalciferol (VITAMIN D3) 50 MCG (2000 UT) capsule Take 2,000 Units by mouth daily.    Yes [provider]  cyclobenzaprine (FLEXERIL) 10 MG tablet Take 1 tablet (10 mg total) by mouth 3 (three) times daily as needed for muscle spasms. 06/11/20  Yes Dawley, Troy C, DO  denosumab (PROLIA) 60 MG/ML SOLN injection Inject 60 mg into the skin every 6 (six) months. Administer in upper arm, thigh, or abdomen   Yes [provider]  gabapentin (NEURONTIN) 600 MG tablet Take 600 mg by mouth 2 (two) times daily. 03/12/22  Yes [provider]  metFORMIN (GLUCOPHAGE-XR) 500 MG 24 hr tablet Take 500 mg by mouth daily with breakfast.    Yes [provider]  Multiple Vitamins-Minerals (CENTRUM SILVER PO) Take 1 tablet by mouth daily.     Yes [provider]  Naproxen Sodium 220 MG CAPS Take 440 mg by mouth daily as needed (pain,headache).    Yes [provider]  omeprazole (PRILOSEC) 20 MG capsule TAKE 1 CAPSULE(20 MG) BY MOUTH DAILY 02/27/22  Yes Gatha Mayer, MD  simvastatin (ZOCOR) 40 MG tablet Take 40 mg by mouth daily. 10/06/16  Yes [provider]    Scheduled Meds:  Infusions:  PRN Meds:    Allergies as of  07/24/2022 - Review Complete 07/24/2022  Allergen Reaction Noted   5-alpha reductase inhibitors  01/19/2021   Codeine  12/08/2009   Hydromorphone Other (See Comments) 02/27/2022    Family History  Problem Relation Age of Onset   COPD Mother    Hyperlipidemia Mother    Hypertension Sister    Hyperlipidemia Sister    Diabetes Neg Hx    Heart disease Neg Hx    Colon cancer Neg Hx    Rectal cancer Neg Hx    Stomach cancer Neg Hx    Esophageal cancer Neg Hx     Social History   Socioeconomic History   Marital status: Widowed    Spouse name: Not on file   Number of children: 3   Years of education: 72   Highest education level: Not on file  Occupational History   Occupation: travel agent    Comment: retired  Tobacco Use   Smoking status: Former    Years: 20.00    Types: Cigarettes    Quit date: 10/22/1998    Years since quitting: 23.7   Smokeless tobacco: Never  Vaping Use   Vaping Use: Never used  Substance and Sexual Activity   Alcohol use: Yes    Alcohol/week: 2.0 standard drinks of alcohol    Types: 2 Standard drinks or equivalent per week    Comment: occasionally   Drug use: No   Sexual activity: Not Currently  Other Topics Concern   Not on file  Social History Narrative   Married '62 - widowed '85; 3 sons - '63, '64, '66; 6 grandchildren. HSG. Worked as a Garment/textile technologist. Lives alone. No pets. Hobbies - walking, gym, needle point. Has a good support network. End- of- Life: OK for CPR, no prolonged ventilator support, no prolong heroic or futile measures.    Social Determinants of Health   Financial Resource Strain: Not on file  Food Insecurity: Not on file  Transportation Needs: Not on file  Physical Activity: Not on file  Stress: Not on file  Social Connections: Not on file  Intimate Partner Violence: Not on file    REVIEW OF SYSTEMS: Constitutional: No weakness, no fatigue. ENT:  No nose bleeds Pulm: No shortness of breath, no cough. CV:  No  palpitations, no LE edema.  No angina. GU:  No hematuria, no frequency GI: See HPI. Heme: Besides the hematochezia, no other unusual bleeding and no bruising. Transfusions: No previous transfusions. Neuro:  No headaches,  no peripheral tingling or numbness.  No syncope, no seizures.  No dizziness. Derm:  No itching, no rash or sores.  Endocrine:  No sweats or chills.  No polyuria or dysuria Immunization: Reviewed. Travel: Not queried.   PHYSICAL EXAM: Vital signs in last 24 hours: Vitals:   07/25/22 1215 07/25/22 1358  BP: 116/80 127/66  Pulse: 84 100  Resp: 16 16  Temp: 98.4 F (36.9 C) 98.1 F (36.7 C)  SpO2: 100% 100%   Wt Readings from Last 3 Encounters:  07/24/22 59.9 kg  06/05/22 64.4 kg  04/16/22 64.4 kg    General: Pleasant, alert, comfortable.  Does not look ill, not unstable. Head: No facial asymmetry or swelling.  No signs of head trauma. Eyes: No conjunctival pallor. Ears: No hearing deficit Nose: No congestion or discharge Mouth: Dentures in place.  Mucosa is moist, pink, clear.  Tongue midline. Neck: No JVD, no masses, no thyromegaly. Lungs: Clear bilaterally without labored breathing.  No cough. Heart: RRR.  No MRG.  S1, S2 present Abdomen: Soft.  No distention.  No tenderness.  Active bowel sounds.  No HSM, masses, bruits, hernias..   Rectal: Did not repeat rectal exam.  Specimen submitted to lab was FOBT positive. Musc/Skeltl: No joint redness, swelling or gross deformity. Extremities: No CCE. Neurologic: Alert.  Fully oriented.  Excellent historian.  No tremors.  No limb weakness. Skin: No rash, no sores, no suspicious lesions, no telangiectasia. Nodes: No cervical adenopathy Psych: Calm, pleasant.  In excellent spirits.  Good sense of humor.  Intake/Output from previous day: 10/03 0701 - 10/04 0700 In: 1000.1 [IV Piggyback:1000.1] Out: -  Intake/Output this shift: No intake/output data recorded.  LAB RESULTS: Recent Labs    07/24/22 1204  07/24/22 1945  WBC 4.4 4.9  HGB 8.2* 8.9*  HCT 25.3* 27.5*  PLT 229 249   BMET Lab Results  Component Value Date   NA 141 07/24/2022   NA 138 07/24/2022   NA 140 04/04/2022   K 4.1 07/24/2022   K 4.0 07/24/2022   K 3.9 04/04/2022   CL 104 07/24/2022   CL 105 07/24/2022   CL 104 04/04/2022   CO2 27 07/24/2022   CO2 24 07/24/2022   CO2 30 04/04/2022   GLUCOSE 113 (H) 07/24/2022   GLUCOSE 114 (H) 07/24/2022   GLUCOSE 148 (H) 04/04/2022   BUN 15 07/24/2022   BUN 13 07/24/2022   BUN 15 04/04/2022   CREATININE 0.66 07/24/2022   CREATININE 0.71 07/24/2022   CREATININE 0.75 04/04/2022   CALCIUM 9.4 07/24/2022   CALCIUM 8.9 07/24/2022   CALCIUM 9.8 04/04/2022   LFT Recent Labs    07/24/22 1945  PROT 7.1  ALBUMIN 4.3  AST 21  ALT 16  ALKPHOS 52  BILITOT 0.2*   PT/INR No results found for: "INR", "PROTIME" Hepatitis Panel No results for input(s): "HEPBSAG", "HCVAB", "HEPAIGM", "HEPBIGM" in the last 72 hours. C-Diff No components found for: "CDIFF" Lipase  No results found for: "LIPASE"  Drugs of Abuse  No results found for: "LABOPIA", "COCAINSCRNUR", "LABBENZ", "AMPHETMU", "THCU", "LABBARB"   RADIOLOGY STUDIES: No results found.    IMPRESSION:   Stuttering but persistent hematochezia for close to 1 week.  Presumed diverticular bleed given she has dense sigmoid diverticulosis seen at previous colonoscopies.  Hemodynamically stable.  Acute blood loss anemia.  Not in need of transfusion at present.  Hgb down 5 gm from her baseline almost 4 months ago.    PLAN:  Ordered stat CT angio bleeding study.  If bleeding identified, refer to IR for attempt at embolization.  Follow CBC.  Transfuse if significantly unstable or Hb drops below 7.  Allow clear liquids once the CT is completed.   Azucena Freed  07/25/2022, 2:04 PM Phone [phone]

## 2022-07-25 NOTE — Plan of Care (Signed)
  Problem: Education: Goal: Knowledge of General Education information will improve Description Including pain rating scale, medication(s)/side effects and non-pharmacologic comfort measures Outcome: Progressing   

## 2022-07-25 NOTE — ED Notes (Signed)
Pt ambulated independently to the bathroom. Placed back on monitor when finished. Pt updated about plan of care and pending transfer, no bed assignment at this time. Lights dimmed and bed adjusted for comfort.

## 2022-07-25 NOTE — Progress Notes (Signed)
Continue to have bright red stools. Assisted to [facility] as needed. Denies pain or discomfort.

## 2022-07-25 NOTE — H&P (Signed)
History and Physical    Patient: Vanessa Boyd [id number] DOB: January 05, 1941 DOA: 07/24/2022 DOS: the patient was seen and examined on 07/25/2022 PCP: Donnajean Lopes, MD  Patient coming from: [facility] transfer  Chief Complaint:  Chief Complaint  Patient presents with   Abnormal Lab   HPI: Vanessa Boyd is a 81 y.o. female with medical history significant of hypertension, hyperlipidemia, diabetes mellitus type 2, IBS, severe diverticulosis, and GERD who presents with complaints of intermittent  bright red blood per rectum for the last week.  Episodes would occur up to 5 times daily and seem to be progressively heavier.  Noted associated symptoms of some left lower quadrant tenderness.  Denied having any lightheadedness, shortness of breath, chest pain, nausea, or vomiting.  Patient is not on any aspirin or blood thinners.  Patient does intermittently take paroxetine for diabetes.  She reported having similar symptoms several years ago for which she was advised to wait a couple days to see if it stops.  She had notified Dr. Carlean Purl of gastroenterology office, but was not able to get an appointment to readily be seen.  She had gone to urgent care and was prescribed Augmentin due to concern for diverticulitis.  She had been sent to [facility] for further evaluation.  While at [place] this morning she ported having another " gushing of blood".  Last colonoscopy was back in 2020 where she was noted to have severe diverticulosis pediatric colonoscope.  On admission into the emergency department patient was seen to be afebrile with mild tachypnea, and vital signs otherwise maintained.  Labs significant for hemoglobin at urgent care 8.2 and recheck at [facility] 8.9.  Stool guaiacs were noted to be positive.  Dr. Lorenso Courier of gastroenterology been consulted.  Patient has been given 1 L normal saline IV fluids and CT angio bleeding study ordered.  Review of Systems: As mentioned in the history of present  illness. All other systems reviewed and are negative. Past Medical History:  Diagnosis Date   Abnormal bone density screening    Diverticulitis    Graves' disease    Hyperlipidemia    Hypertension    Hyperthyroidism    Hypoglycemia, unspecified    IBS (irritable bowel syndrome)    Obesity    Osteoporosis    Palpitations    Post-menopausal    Spinal stenosis 2017   Tachycardia    Tobacco use    Past Surgical History:  Procedure Laterality Date   ABDOMINAL HYSTERECTOMY     APPENDECTOMY     BACK SURGERY  05/2021   EYE SURGERY     bilateral cataract surgery   OOPHORECTOMY     orif left elbow     SHOULDER OPEN ROTATOR CUFF REPAIR     TONSILLECTOMY     Social History:  reports that she quit smoking about 23 years ago. Her smoking use included cigarettes. She has never used smokeless tobacco. She reports current alcohol use of about 2.0 standard drinks of alcohol per week. She reports that she does not use drugs.  Allergies  Allergen Reactions   5-Alpha Reductase Inhibitors    Codeine     REACTION: nausea/vomiting   Hydromorphone Other (See Comments)    Hallucinations     Family History  Problem Relation Age of Onset   COPD Mother    Hyperlipidemia Mother    Hypertension Sister    Hyperlipidemia Sister    Diabetes Neg Hx    Heart disease Neg Hx  Colon cancer Neg Hx    Rectal cancer Neg Hx    Stomach cancer Neg Hx    Esophageal cancer Neg Hx     Prior to Admission medications   Medication Sig Start Date End Date Taking? Authorizing Provider  amoxicillin-clavulanate (AUGMENTIN) 875-125 MG tablet Take 1 tablet by mouth every 12 (twelve) hours for 10 days. 07/24/22 08/03/22 Yes Piontek, Erin, MD  ascorbic acid (VITAMIN C) 500 MG tablet Take 500 mg by mouth daily.   Yes [provider]  Cholecalciferol (VITAMIN D3) 50 MCG (2000 UT) capsule Take 2,000 Units by mouth daily.    Yes [provider]  cyclobenzaprine (FLEXERIL) 10 MG tablet Take 1 tablet  (10 mg total) by mouth 3 (three) times daily as needed for muscle spasms. 06/11/20  Yes Dawley, Troy C, DO  denosumab (PROLIA) 60 MG/ML SOLN injection Inject 60 mg into the skin every 6 (six) months. Administer in upper arm, thigh, or abdomen   Yes [provider]  gabapentin (NEURONTIN) 600 MG tablet Take 600 mg by mouth 2 (two) times daily. 03/12/22  Yes [provider]  metFORMIN (GLUCOPHAGE-XR) 500 MG 24 hr tablet Take 500 mg by mouth daily with breakfast.    Yes [provider]  Multiple Vitamins-Minerals (CENTRUM SILVER PO) Take 1 tablet by mouth daily.     Yes [provider]  Naproxen Sodium 220 MG CAPS Take 440 mg by mouth daily as needed (pain,headache).    Yes [provider]  omeprazole (PRILOSEC) 20 MG capsule TAKE 1 CAPSULE(20 MG) BY MOUTH DAILY 02/27/22  Yes Gatha Mayer, MD  simvastatin (ZOCOR) 40 MG tablet Take 40 mg by mouth daily. 10/06/16  Yes [provider]    Physical Exam: Vitals:   07/25/22 0750 07/25/22 1100 07/25/22 1215 07/25/22 1358  BP: 124/60 139/69 116/80 127/66  Pulse: 89 91 84 100  Resp: 14 (!) 22 16 16   Temp: 98 F (36.7 C) 98.5 F (36.9 C) 98.4 F (36.9 C) 98.1 F (36.7 C)  TempSrc:    Oral  SpO2: 98% 100% 100% 100%  Weight:      Height:       Exam  Constitutional: Elderly female currently in no acute distress Eyes: PERRL, lids and conjunctivae normal ENMT: Mucous membranes are moist. Posterior pharynx clear of any exudate or lesions.  Neck: normal, supple, no masses, no thyromegaly Respiratory: clear to auscultation bilaterally, no wheezing, no crackles. Normal respiratory effort. No accessory muscle use.  Cardiovascular: Regular rate and rhythm, no murmurs / rubs / gallops. 2+ pedal pulses.   Abdomen: Mild left lower quadrant tenderness appreciated.  Bowel sounds positive.  Musculoskeletal: no clubbing / cyanosis. No joint deformity upper and lower extremities.   Normal muscle tone.  Skin:  no rashes, lesions, ulcers.  Pallor present. Neurologic: CN 2-12 grossly intact.   Strength 5/5 in all 4.  Psychiatric: Normal judgment and insight. Alert and oriented x 3. Normal mood.   Data Reviewed:  Reviewed labs, imaging, and pertinent records as noted above in HPI.  Assessment and Plan:  Acute blood loss anemia secondary to lower GI bleed Patient presents with a week of intermittent episodes of bright red blood per rectum.  Noted associated left lower quadrant discomfort.  Prior history of severe diverticulosis her last colonoscopy had to be done with pediatric colonoscope in 2020.  Hemoglobin previously had been 13.9 on 04/04/2022., but it dropped down to as low as 8.2 g/dL.  Patient is not on  aspirin or any blood thinners.  Likely secondary to diverticular bleeding.  On the differential includes the possibility of diverticulitis. -Admit to a medical telemetry bed -Type and screen for possible need of blood products -Serial monitoring of H&H.  Transfuse blood products as needed for hemoglobins less than or close to 7 -Follow-up CT angio bleeding study.  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 -Add on hemoglobin A1c -Held metformin -Continue gabapentin  Hyperlipidemia -Continue simvastatin  GERD -Continue pharmacy substitution of Protonix    Advance Care Planning:   Code Status: full  Consults: [facility]  Family Communication: Would like to update her family.  Severity of Illness: The appropriate patient status for this patient is OBSERVATION. Observation status is judged to be reasonable and necessary in order to provide the required intensity of service to ensure the patient's safety. The patient's presenting symptoms, physical exam findings, and initial  radiographic and laboratory data in the context of their medical condition is felt to place them at decreased risk for further clinical deterioration. Furthermore, it is anticipated that the patient will be medically stable for discharge from the hospital within 2 midnights of admission.  Author: Norval Morton, MD 07/25/2022 2:40 PM  For on call review [email or web address].

## 2022-07-25 NOTE — Progress Notes (Signed)
New Admission Note:   Arrival Method: Stretcher  Mental Orientation: Alert and oriented  Telemetry: Assessment: Completed Skin: Intact  IV: Left AC Pain: 0/10  Tubes:none  Safety Measures: Safety Fall Prevention Plan has been given, discussed and signed Admission: Completed [facility] Orientation: Patient has been orientated to the room, unit and staff.  Family: None   Orders have been reviewed and implemented. Will continue to monitor the patient. Call light has been placed within reach and bed alarm has been activated.   Kyoko Elsea RN [facility] Phone: [phone]

## 2022-07-25 NOTE — ED Notes (Signed)
Pt ambulated to the bathroom by self with steady gait.

## 2022-07-26 DIAGNOSIS — K921 Melena: Secondary | ICD-10-CM | POA: Diagnosis not present

## 2022-07-26 DIAGNOSIS — Z87891 Personal history of nicotine dependence: Secondary | ICD-10-CM | POA: Diagnosis not present

## 2022-07-26 DIAGNOSIS — Z825 Family history of asthma and other chronic lower respiratory diseases: Secondary | ICD-10-CM | POA: Diagnosis not present

## 2022-07-26 DIAGNOSIS — D62 Acute posthemorrhagic anemia: Secondary | ICD-10-CM | POA: Diagnosis not present

## 2022-07-26 DIAGNOSIS — Z90710 Acquired absence of both cervix and uterus: Secondary | ICD-10-CM | POA: Diagnosis not present

## 2022-07-26 DIAGNOSIS — E119 Type 2 diabetes mellitus without complications: Secondary | ICD-10-CM | POA: Diagnosis not present

## 2022-07-26 DIAGNOSIS — Z7984 Long term (current) use of oral hypoglycemic drugs: Secondary | ICD-10-CM | POA: Diagnosis not present

## 2022-07-26 DIAGNOSIS — E1140 Type 2 diabetes mellitus with diabetic neuropathy, unspecified: Secondary | ICD-10-CM | POA: Diagnosis not present

## 2022-07-26 DIAGNOSIS — E0590 Thyrotoxicosis, unspecified without thyrotoxic crisis or storm: Secondary | ICD-10-CM | POA: Diagnosis not present

## 2022-07-26 DIAGNOSIS — K625 Hemorrhage of anus and rectum: Secondary | ICD-10-CM | POA: Diagnosis not present

## 2022-07-26 DIAGNOSIS — K922 Gastrointestinal hemorrhage, unspecified: Secondary | ICD-10-CM | POA: Diagnosis present

## 2022-07-26 DIAGNOSIS — K219 Gastro-esophageal reflux disease without esophagitis: Secondary | ICD-10-CM | POA: Diagnosis present

## 2022-07-26 DIAGNOSIS — D509 Iron deficiency anemia, unspecified: Secondary | ICD-10-CM | POA: Diagnosis not present

## 2022-07-26 DIAGNOSIS — Z8249 Family history of ischemic heart disease and other diseases of the circulatory system: Secondary | ICD-10-CM | POA: Diagnosis not present

## 2022-07-26 DIAGNOSIS — E785 Hyperlipidemia, unspecified: Secondary | ICD-10-CM | POA: Diagnosis not present

## 2022-07-26 DIAGNOSIS — K5730 Diverticulosis of large intestine without perforation or abscess without bleeding: Secondary | ICD-10-CM | POA: Diagnosis not present

## 2022-07-26 DIAGNOSIS — M810 Age-related osteoporosis without current pathological fracture: Secondary | ICD-10-CM | POA: Diagnosis present

## 2022-07-26 DIAGNOSIS — I10 Essential (primary) hypertension: Secondary | ICD-10-CM | POA: Diagnosis not present

## 2022-07-26 DIAGNOSIS — Z79899 Other long term (current) drug therapy: Secondary | ICD-10-CM | POA: Diagnosis not present

## 2022-07-26 DIAGNOSIS — D649 Anemia, unspecified: Secondary | ICD-10-CM

## 2022-07-26 DIAGNOSIS — K5733 Diverticulitis of large intestine without perforation or abscess with bleeding: Secondary | ICD-10-CM | POA: Diagnosis not present

## 2022-07-26 DIAGNOSIS — K648 Other hemorrhoids: Secondary | ICD-10-CM | POA: Diagnosis present

## 2022-07-26 DIAGNOSIS — K589 Irritable bowel syndrome without diarrhea: Secondary | ICD-10-CM | POA: Diagnosis present

## 2022-07-26 DIAGNOSIS — I9589 Other hypotension: Secondary | ICD-10-CM | POA: Diagnosis not present

## 2022-07-26 DIAGNOSIS — I700 Atherosclerosis of aorta: Secondary | ICD-10-CM | POA: Diagnosis not present

## 2022-07-26 LAB — BASIC METABOLIC PANEL
Anion gap: 4 — ABNORMAL LOW (ref 5–15)
BUN: 13 mg/dL (ref 8–23)
CO2: 23 mmol/L (ref 22–32)
Calcium: 7.8 mg/dL — ABNORMAL LOW (ref 8.9–10.3)
Chloride: 114 mmol/L — ABNORMAL HIGH (ref 98–111)
Creatinine, Ser: 0.53 mg/dL (ref 0.44–1.00)
GFR, Estimated: >60 mL/min (ref >60)
Glucose, Bld: 100 mg/dL — ABNORMAL HIGH (ref 70–99)
Potassium: 3.8 mmol/L (ref 3.5–5.1)
Sodium: 141 mmol/L (ref 135–145)

## 2022-07-26 LAB — CBC
HCT: 21.1 % — ABNORMAL LOW (ref 36.0–46.0)
Hemoglobin: 7.1 g/dL — ABNORMAL LOW (ref 12.0–15.0)
MCH: 30.6 pg (ref 26.0–34.0)
MCHC: 33.6 g/dL (ref 30.0–36.0)
MCV: 90.9 fL (ref 80.0–100.0)
Platelets: 204 10*3/uL (ref 150–400)
RBC: 2.32 MIL/uL — ABNORMAL LOW (ref 3.87–5.11)
RDW: 15.1 % (ref 11.5–15.5)
WBC: 5 10*3/uL (ref 4.0–10.5)
nRBC: 0 % (ref 0.0–0.2)

## 2022-07-26 LAB — IRON AND TIBC
Iron: 42 ug/dL (ref 28–170)
Saturation Ratios: 10 % — ABNORMAL LOW (ref 10.4–31.8)
TIBC: 430 ug/dL (ref 250–450)
UIBC: 388 ug/dL

## 2022-07-26 LAB — VITAMIN B12: Vitamin B-12: 933 pg/mL — ABNORMAL HIGH (ref 180–914)

## 2022-07-26 LAB — FERRITIN: Ferritin: 9 ng/mL — ABNORMAL LOW (ref 11–307)

## 2022-07-26 LAB — RETICULOCYTES
Immature Retic Fract: 24.6 % — ABNORMAL HIGH (ref 2.3–15.9)
RBC.: 2.26 MIL/uL — ABNORMAL LOW (ref 3.87–5.11)
Retic Count, Absolute: 64.4 10*3/uL (ref 19.0–186.0)
Retic Ct Pct: 2.9 % (ref 0.4–3.1)

## 2022-07-26 LAB — PREPARE RBC (CROSSMATCH)

## 2022-07-26 LAB — FOLATE: Folate: >40 ng/mL (ref >5.9)

## 2022-07-26 LAB — GLUCOSE, CAPILLARY: Glucose-Capillary: 106 mg/dL — ABNORMAL HIGH (ref 70–99)

## 2022-07-26 MED ORDER — PEG-KCL-NACL-NASULF-NA ASC-C 100 G PO SOLR: 1.0000 | Freq: Once | ORAL | Status: DC

## 2022-07-26 MED ORDER — SODIUM CHLORIDE 0.9 % IV SOLN: INTRAVENOUS | Status: DC

## 2022-07-26 MED ORDER — INSULIN ASPART 100 UNIT/ML IJ SOLN: 0.0000 [IU] | Freq: Three times a day (TID) | INTRAMUSCULAR | Status: DC

## 2022-07-26 MED ORDER — PEG-KCL-NACL-NASULF-NA ASC-C 100 G PO SOLR
0.5000 | Freq: Once | ORAL | Status: AC
  Administered 2022-07-27: 100 g via ORAL
  Filled 2022-07-26: qty 1

## 2022-07-26 MED ORDER — PEG-KCL-NACL-NASULF-NA ASC-C 100 G PO SOLR
0.5000 | Freq: Once | ORAL | Status: AC
  Administered 2022-07-26: 100 g via ORAL
  Filled 2022-07-26: qty 1

## 2022-07-26 MED ORDER — BISACODYL 5 MG PO TBEC
10.0000 mg | DELAYED_RELEASE_TABLET | Freq: Once | ORAL | Status: AC
  Administered 2022-07-26: 10 mg via ORAL
  Filled 2022-07-26: qty 2

## 2022-07-26 MED ORDER — SODIUM CHLORIDE 0.9% IV SOLUTION: Freq: Once | INTRAVENOUS | Status: DC

## 2022-07-26 NOTE — Progress Notes (Signed)
  Gastroenterology Inpatient Follow Up    Subjective: Had some additional hematochezia last night. Has had some abdominal cramping.  Objective: Vital signs in last 24 hours: Temp:  [97.5 F (36.4 C)-98.6 F (37 C)] 98.4 F (36.9 C) (10/05 1109) Pulse Rate:  [74-110] 75 (10/05 1109) Resp:  [16-20] 18 (10/05 1109) BP: (106-128)/(44-80) 115/55 (10/05 1109) SpO2:  [98 %-100 %] 100 % (10/05 1109) Weight:  [61.4 kg] 61.4 kg (10/04 1400) Last BM Date : 07/25/22  Intake/Output from previous day: 10/04 0701 - 10/05 0700 In: 490 [P.O.:120; Blood:320; IV Piggyback:50] Out: 150  Intake/Output this shift: No intake/output data recorded.  General appearance: alert and cooperative Resp: no increased WOB Cardio: regular rate GI: non-tender Extremities: no BLE edema  Lab Results: Recent Labs    07/24/22 1945 07/25/22 1509 07/25/22 2201 07/26/22 0343  WBC 4.9 5.8  --  5.0  HGB 8.9* 7.1* 8.0* 7.1*  HCT 27.5* 22.1* 23.6* 21.1*  PLT 249 231  --  204   BMET Recent Labs    07/24/22 1945 07/25/22 1509 07/26/22 0343  NA 141 141 141  K 4.1 4.2 3.8  CL 104 112* 114*  CO2 27 23 23  GLUCOSE 113* 116* 100*  BUN 15 9 13  CREATININE 0.66 0.69 0.53  CALCIUM 9.4 8.4* 7.8*   LFT Recent Labs    07/24/22 1945  PROT 7.1  ALBUMIN 4.3  AST 21  ALT 16  ALKPHOS 52  BILITOT 0.2*   PT/INR No results for input(s): "LABPROT", "INR" in the last 72 hours. Hepatitis Panel No results for input(s): "HEPBSAG", "HCVAB", "HEPAIGM", "HEPBIGM" in the last 72 hours. C-Diff No results for input(s): "CDIFFTOX" in the last 72 hours.  Studies/Results: CT ANGIO GI BLEED  Result Date: 07/25/2022 CLINICAL DATA:  Lower GI bleeding. EXAM: CTA ABDOMEN AND PELVIS WITHOUT AND WITH CONTRAST TECHNIQUE: Multidetector CT imaging of the abdomen and pelvis was performed using the standard protocol during bolus administration of intravenous contrast. Multiplanar reconstructed images and MIPs were obtained  and reviewed to evaluate the vascular anatomy. RADIATION DOSE REDUCTION: This exam was performed according to the departmental dose-optimization program which includes automated exposure control, adjustment of the mA and/or kV according to patient size and/or use of iterative reconstruction technique. CONTRAST:  60mL OMNIPAQUE IOHEXOL 350 MG/ML SOLN COMPARISON:  MRI lumbar spine 04/19/2020. FINDINGS: VASCULAR Aorta: Normal caliber aorta without aneurysm, dissection, vasculitis or significant stenosis. There is moderate calcified atherosclerotic disease throughout the aorta. Celiac: Patent without evidence of aneurysm, dissection, vasculitis or significant stenosis. SMA: Patent without evidence of aneurysm, dissection, vasculitis or significant stenosis. Renals: Both renal arteries are patent without evidence of aneurysm, dissection, vasculitis, fibromuscular dysplasia or significant stenosis. IMA: Patent without evidence of aneurysm, dissection, vasculitis or significant stenosis. Inflow: Patent without evidence of aneurysm, dissection, vasculitis or significant stenosis. Proximal Outflow: Bilateral common femoral and visualized portions of the superficial and profunda femoral arteries are patent without evidence of aneurysm, dissection, vasculitis or significant stenosis. Veins: No obvious venous abnormality within the limitations of this arterial phase study. Review of the MIP images confirms the above findings. NON-VASCULAR Lower chest: No acute abnormality. Hepatobiliary: No focal liver abnormality is seen. No gallstones, gallbladder wall thickening, or biliary dilatation. Pancreas: Unremarkable. No pancreatic ductal dilatation or surrounding inflammatory changes. Spleen: Normal in size without focal abnormality. Adrenals/Urinary Tract: Rounded cortical hypodensity in the right kidney is too small to characterize, likely cysts. Otherwise, the kidneys, adrenal glands and bladder are within normal limits.    Stomach/Bowel: No evidence for active gastrointestinal bleeding. There is some mild wall thickening versus normal under distention of the sigmoid colon. No associated inflammation. There is sigmoid colon diverticulosis. No bowel obstruction, pneumatosis or free air. The appendix is not seen. Small bowel loops are within normal limits. There is a moderate-sized hiatal hernia. Stomach is otherwise within normal limits. Lymphatic: No enlarged lymph nodes are seen. Reproductive: Status post hysterectomy. No adnexal masses. Other: No abdominal wall hernia or abnormality. No abdominopelvic ascites. Musculoskeletal: L4-L5 posterior fusion hardware is present which appears intact. There is 6 mm of anterolisthesis at this level. Mild chronic compression deformity of L1 appears stable. IMPRESSION: 1. No evidence for active gastrointestinal bleeding. 2. Moderate calcified atherosclerotic disease of the aorta. NON-VASCULAR 1. Mild wall thickening versus normal under distention of the sigmoid colon. No associated inflammation. Correlate clinically for colitis. 2. Sigmoid colon diverticulosis. 3. Moderate-sized hiatal hernia. 4. Subcentimeter right Bosniak II renal cyst, too small to characterize. No follow-up imaging is recommended. JACR 2018 Feb; 264-273, Management of the Incidental RenalMass on CT, [employer] 2021; 814-848, Bosniak Classification of Cystic Renal Masses, Version 2019. Aortic Atherosclerosis (ICD10-I70.0). Electronically Signed   By: Ronney Asters M.D.   On: 07/25/2022 17:17    Medications: I have reviewed the patient's current medications. Scheduled:  sodium chloride   Intravenous Once   gabapentin  600 mg Oral BID   insulin aspart  0-9 Units Subcutaneous TID WC   pantoprazole  40 mg Oral Daily   peg 3350 powder  0.5 kit Oral Once   And   [START ON 07/27/2022] peg 3350 powder  0.5 kit Oral Once   simvastatin  40 mg Oral Daily   Continuous: HWE:XHBZJIRCVELFY **OR** acetaminophen, albuterol,  cyclobenzaprine, ondansetron **OR** ondansetron (ZOFRAN) IV  CTA GI bleed 07/25/22: IMPRESSION: 1. No evidence for active gastrointestinal bleeding. 2. Moderate calcified atherosclerotic disease of the aorta. NON-VASCULAR 1. Mild wall thickening versus normal under distention of the sigmoid colon. No associated inflammation. Correlate clinically for colitis. 2. Sigmoid colon diverticulosis. 3. Moderate-sized hiatal hernia. 4. Subcentimeter right Bosniak II renal cyst, too small to characterize. No follow-up imaging is recommended. JACR 2018 Feb; 264-273, Management of the Incidental RenalMass on CT, [employer] 2021; 814-848, Bosniak Classification of Cystic Renal Masses, Version 2019. Aortic Atherosclerosis (ICD10-I70.0).  Assessment/Plan: 81 year old female with history of diverticulosis and DM presents with hematochezia that started about a week ago. Denies use of blood thinners. Occasional naproxen use. Hb has dropped from 13.9 in 03/2022 to 8.2 upon arrival. Her last colonoscopy was in 2020 that showed significant diverticulosis. CTA GI bleed was negative for a source of bleeding. Since patient has continued to have some bleeding and drops in Hb, will plan for colonoscopy tomorrow for further evaluation. Still suspect diverticular bleed at this time.  - Plan for colonoscopy tomorrow. Prep orders placed.   LOS: 0 days   Sharyn Creamer 07/26/2022, 11:33 AM

## 2022-07-26 NOTE — H&P (View-Only) (Signed)
Gastroenterology Inpatient Follow Up    Subjective: Had some additional hematochezia last night. Has had some abdominal cramping.  Objective: Vital signs in last 24 hours: Temp:  [97.5 F (36.4 C)-98.6 F (37 C)] 98.4 F (36.9 C) (10/05 1109) Pulse Rate:  [74-110] 75 (10/05 1109) Resp:  [16-20] 18 (10/05 1109) BP: (106-128)/(44-80) 115/55 (10/05 1109) SpO2:  [98 %-100 %] 100 % (10/05 1109) Weight:  [61.4 kg] 61.4 kg (10/04 1400) Last BM Date : 07/25/22  Intake/Output from previous day: 10/04 0701 - 10/05 0700 In: 490 [P.O.:120; Blood:320; IV Piggyback:50] Out: 150  Intake/Output this shift: No intake/output data recorded.  General appearance: alert and cooperative Resp: no increased WOB Cardio: regular rate GI: non-tender Extremities: no BLE edema  Lab Results: Recent Labs    07/24/22 1945 07/25/22 1509 07/25/22 2201 07/26/22 0343  WBC 4.9 5.8  --  5.0  HGB 8.9* 7.1* 8.0* 7.1*  HCT 27.5* 22.1* 23.6* 21.1*  PLT 249 231  --  204   BMET Recent Labs    07/24/22 1945 07/25/22 1509 07/26/22 0343  NA 141 141 141  K 4.1 4.2 3.8  CL 104 112* 114*  CO2 _0 GLUCOSE 113* 116* 100*  BUN _1 CREATININE 0.66 0.69 0.53  CALCIUM 9.4 8.4* 7.8*   LFT Recent Labs    07/24/22 1945  PROT 7.1  ALBUMIN 4.3  AST 21  ALT 16  ALKPHOS 52  BILITOT 0.2*   PT/INR No results for input(s): "LABPROT", "INR" in the last 72 hours. Hepatitis Panel No results for input(s): "HEPBSAG", "HCVAB", "HEPAIGM", "HEPBIGM" in the last 72 hours. C-Diff No results for input(s): "CDIFFTOX" in the last 72 hours.  Studies/Results: CT ANGIO GI BLEED  Result Date: 07/25/2022 CLINICAL DATA:  Lower GI bleeding. EXAM: CTA ABDOMEN AND PELVIS WITHOUT AND WITH CONTRAST TECHNIQUE: Multidetector CT imaging of the abdomen and pelvis was performed using the standard protocol during bolus administration of intravenous contrast. Multiplanar reconstructed images and MIPs were obtained  and reviewed to evaluate the vascular anatomy. RADIATION DOSE REDUCTION: This exam was performed according to the departmental dose-optimization program which includes automated exposure control, adjustment of the mA and/or kV according to patient size and/or use of iterative reconstruction technique. CONTRAST:  29m OMNIPAQUE IOHEXOL 350 MG/ML SOLN COMPARISON:  MRI lumbar spine 04/19/2020. FINDINGS: VASCULAR Aorta: Normal caliber aorta without aneurysm, dissection, vasculitis or significant stenosis. There is moderate calcified atherosclerotic disease throughout the aorta. Celiac: Patent without evidence of aneurysm, dissection, vasculitis or significant stenosis. SMA: Patent without evidence of aneurysm, dissection, vasculitis or significant stenosis. Renals: Both renal arteries are patent without evidence of aneurysm, dissection, vasculitis, fibromuscular dysplasia or significant stenosis. IMA: Patent without evidence of aneurysm, dissection, vasculitis or significant stenosis. Inflow: Patent without evidence of aneurysm, dissection, vasculitis or significant stenosis. Proximal Outflow: Bilateral common femoral and visualized portions of the superficial and profunda femoral arteries are patent without evidence of aneurysm, dissection, vasculitis or significant stenosis. Veins: No obvious venous abnormality within the limitations of this arterial phase study. Review of the MIP images confirms the above findings. NON-VASCULAR Lower chest: No acute abnormality. Hepatobiliary: No focal liver abnormality is seen. No gallstones, gallbladder wall thickening, or biliary dilatation. Pancreas: Unremarkable. No pancreatic ductal dilatation or surrounding inflammatory changes. Spleen: Normal in size without focal abnormality. Adrenals/Urinary Tract: Rounded cortical hypodensity in the right kidney is too small to characterize, likely cysts. Otherwise, the kidneys, adrenal glands and bladder are within normal limits.  Stomach/Bowel: No evidence for active gastrointestinal bleeding. There is some mild wall thickening versus normal under distention of the sigmoid colon. No associated inflammation. There is sigmoid colon diverticulosis. No bowel obstruction, pneumatosis or free air. The appendix is not seen. Small bowel loops are within normal limits. There is a moderate-sized hiatal hernia. Stomach is otherwise within normal limits. Lymphatic: No enlarged lymph nodes are seen. Reproductive: Status post hysterectomy. No adnexal masses. Other: No abdominal wall hernia or abnormality. No abdominopelvic ascites. Musculoskeletal: L4-L5 posterior fusion hardware is present which appears intact. There is 6 mm of anterolisthesis at this level. Mild chronic compression deformity of L1 appears stable. IMPRESSION: 1. No evidence for active gastrointestinal bleeding. 2. Moderate calcified atherosclerotic disease of the aorta. NON-VASCULAR 1. Mild wall thickening versus normal under distention of the sigmoid colon. No associated inflammation. Correlate clinically for colitis. 2. Sigmoid colon diverticulosis. 3. Moderate-sized hiatal hernia. 4. Subcentimeter right Bosniak II renal cyst, too small to characterize. No follow-up imaging is recommended. JACR 2018 Feb; 264-273, Management of the Incidental RenalMass on CT, [employer] 2021; 814-848, Bosniak Classification of Cystic Renal Masses, Version 2019. Aortic Atherosclerosis (ICD10-I70.0). Electronically Signed   By: Ronney Asters M.D.   On: 07/25/2022 17:17    Medications: I have reviewed the patient's current medications. Scheduled:  sodium chloride   Intravenous Once   gabapentin  600 mg Oral BID   insulin aspart  0-9 Units Subcutaneous TID WC   pantoprazole  40 mg Oral Daily   peg 3350 powder  0.5 kit Oral Once   And   [START ON 07/27/2022] peg 3350 powder  0.5 kit Oral Once   simvastatin  40 mg Oral Daily   Continuous: HWE:XHBZJIRCVELFY **OR** acetaminophen, albuterol,  cyclobenzaprine, ondansetron **OR** ondansetron (ZOFRAN) IV  CTA GI bleed 07/25/22: IMPRESSION: 1. No evidence for active gastrointestinal bleeding. 2. Moderate calcified atherosclerotic disease of the aorta. NON-VASCULAR 1. Mild wall thickening versus normal under distention of the sigmoid colon. No associated inflammation. Correlate clinically for colitis. 2. Sigmoid colon diverticulosis. 3. Moderate-sized hiatal hernia. 4. Subcentimeter right Bosniak II renal cyst, too small to characterize. No follow-up imaging is recommended. JACR 2018 Feb; 264-273, Management of the Incidental RenalMass on CT, [employer] 2021; 814-848, Bosniak Classification of Cystic Renal Masses, Version 2019. Aortic Atherosclerosis (ICD10-I70.0).  Assessment/Plan: 81 year old female with history of diverticulosis and DM presents with hematochezia that started about a week ago. Denies use of blood thinners. Occasional naproxen use. Hb has dropped from 13.9 in 03/2022 to 8.2 upon arrival. Her last colonoscopy was in 2020 that showed significant diverticulosis. CTA GI bleed was negative for a source of bleeding. Since patient has continued to have some bleeding and drops in Hb, will plan for colonoscopy tomorrow for further evaluation. Still suspect diverticular bleed at this time.  - Plan for colonoscopy tomorrow. Prep orders placed.   LOS: 0 days   Sharyn Creamer 07/26/2022, 11:33 AM

## 2022-07-26 NOTE — Progress Notes (Signed)
Patient rested last night. No bleeding noted most of the night.

## 2022-07-26 NOTE — Progress Notes (Signed)
[facility]                                                                              Walsh, is a 81 y.o. female, DOB - 1941-09-12, [id number] Admit date - 07/24/2022    Outpatient Primary MD for the patient is Donnajean Lopes, MD  LOS - 0  days  Chief Complaint  Patient presents with   Abnormal Lab       Brief summary   Patient is a 81 year old female with HTN, HLP, diabetes mellitus type 2, IBS with severe diverticulosis, GERD presented with intermittent bright red blood per rectum for the last week.  Episodes would occur up to 5 times daily and seems to be progressively heavier.  Patient also noted associated symptoms of some LLQ tenderness no lightheadedness shortness of breath, chest pain nausea vomiting.  Not on aspirin or blood thinners. She had gone to urgent care and was prescribed Augmentin due to concern for diverticulitis and then sent to ED for further evaluation. In [facility], on the morning of admission she reported another " gushing of blood".  Last colonoscopy in 2020 had shown severe diverticulosis. In ED, afebrile, mild tachypnea, vital signs otherwise stable.  Initial hemoglobin 8.9-> 7.1, received 1 unit of packed RBCs improved to 8.0, but then had bleeding in ED.   Assessment & Plan    Principal Problem: Acute lower GI bleed, acute blood loss anemia, painless BRBPR in the setting of known diverticulosis -Likely diverticular bleeding, hemoglobin initially 8.9, trended down to 7.1 patient received 1 unit of packed RBCs but subsequently had large bleeding in ED -CT angiogram showed no active GI bleed -Hb this morning 7.1, will transfuse 2 units packed RBCs, baseline hb ~12 -Follow anemia panel -GI consulted, will await further recommendations  Active Problems:  NIDDM, controlled type 2 diabetes mellitus with diabetic neuropathy (HCC) -Continue to hold metformin -Hemoglobin A1c 5.4 -Currently on clear liquid diet, continue  Neurontin -Will add SSI    Hyperlipidemia -Continue Zocor    GERD (gastroesophageal reflux disease) -Continue Protonix   Code Status: Full code old DVT Prophylaxis:  SCDs Start: 07/25/22 1446   Level of Care: Level of care: Telemetry Medical Family Communication: Updated patient's son, Shanon Brow and his wife on the phone   Disposition Plan:      Remains inpatient appropriate:  awaiting further GI work up    Procedures:  CTA abdomen  Consultants:   GI   Antimicrobials: None    Medications  sodium chloride   Intravenous Once   gabapentin  600 mg Oral BID   pantoprazole  40 mg Oral Daily   simvastatin  40 mg Oral Daily      Subjective:   Shondrea Gaynor was seen and examined today.  Had significant bleeding in the ED however no further bleeding overnight or this morning.  Hemoglobin still low 7.1.  Has not ambulated, hence denies any dizziness, chest pain, shortness of breath no abdominal pain, nausea or vomiting. Objective:   Vitals:   07/25/22 2355 07/26/22 0632 07/26/22 0900 07/26/22 1030  BP: 128/66 (!) 106/56 (!) 115/56 (!) 127/44  Pulse: 89  93 74 98  Resp: 18 18 18 18   Temp: (!) 97.5 F (36.4 C) 97.8 F (36.6 C) 98.3 F (36.8 C) 98.2 F (36.8 C)  TempSrc: Oral Oral Oral Oral  SpO2: 100% 98% 99% 99%  Weight:      Height:        Intake/Output Summary (Last 24 hours) at 07/26/2022 1057 Last data filed at 07/26/2022 0600 Gross per 24 hour  Intake 490 ml  Output 150 ml  Net 340 ml     Wt Readings from Last 3 Encounters:  07/25/22 61.4 kg  06/05/22 64.4 kg  04/16/22 64.4 kg     Exam General: Alert and oriented x 3, NAD, very pleasant Cardiovascular: S1 S2 auscultated,  RRR Respiratory: Clear to auscultation bilaterally Gastrointestinal: Soft, nontender, nondistended, + bowel sounds Ext: no pedal edema bilaterally Neuro: no new deficits Psych: Normal affect and demeanor, alert and oriented x3     Data Reviewed:  I have personally reviewed  following labs    CBC Lab Results  Component Value Date   WBC 5.0 07/26/2022   RBC 2.32 (L) 07/26/2022   HGB 7.1 (L) 07/26/2022   HCT 21.1 (L) 07/26/2022   MCV 90.9 07/26/2022   MCH 30.6 07/26/2022   PLT 204 07/26/2022   MCHC 33.6 07/26/2022   RDW 15.1 07/26/2022   LYMPHSABS 0.8 07/24/2022   MONOABS 0.5 07/24/2022   EOSABS 0.1 07/24/2022   BASOSABS 0.0 32/67/1245     Last metabolic panel Lab Results  Component Value Date   NA 141 07/26/2022   K 3.8 07/26/2022   CL 114 (H) 07/26/2022   CO2 23 07/26/2022   BUN 13 07/26/2022   CREATININE 0.53 07/26/2022   GLUCOSE 100 (H) 07/26/2022   GFRNONAA >60 07/26/2022   GFRAA >60 06/09/2020   CALCIUM 7.8 (L) 07/26/2022   PROT 7.1 07/24/2022   ALBUMIN 4.3 07/24/2022   BILITOT 0.2 (L) 07/24/2022   ALKPHOS 52 07/24/2022   AST 21 07/24/2022   ALT 16 07/24/2022   ANIONGAP 4 (L) 07/26/2022    CBG (last 3)  No results for input(s): "GLUCAP" in the last 72 hours.    Coagulation Profile: No results for input(s): "INR", "PROTIME" in the last 168 hours.   Radiology Studies: I have personally reviewed the imaging studies  CT ANGIO GI BLEED  Result Date: 07/25/2022 CLINICAL DATA:  Lower GI bleeding. EXAM: CTA ABDOMEN AND PELVIS WITHOUT AND WITH CONTRAST TECHNIQUE: Multidetector CT imaging of the abdomen and pelvis was performed using the standard protocol during bolus administration of intravenous contrast. Multiplanar reconstructed images and MIPs were obtained and reviewed to evaluate the vascular anatomy. RADIATION DOSE REDUCTION: This exam was performed according to the departmental dose-optimization program which includes automated exposure control, adjustment of the mA and/or kV according to patient size and/or use of iterative reconstruction technique. CONTRAST:  74mL OMNIPAQUE IOHEXOL 350 MG/ML SOLN COMPARISON:  MRI lumbar spine 04/19/2020. FINDINGS: VASCULAR Aorta: Normal caliber aorta without aneurysm, dissection, vasculitis or  significant stenosis. There is moderate calcified atherosclerotic disease throughout the aorta. Celiac: Patent without evidence of aneurysm, dissection, vasculitis or significant stenosis. SMA: Patent without evidence of aneurysm, dissection, vasculitis or significant stenosis. Renals: Both renal arteries are patent without evidence of aneurysm, dissection, vasculitis, fibromuscular dysplasia or significant stenosis. IMA: Patent without evidence of aneurysm, dissection, vasculitis or significant stenosis. Inflow: Patent without evidence of aneurysm, dissection, vasculitis or significant stenosis. Proximal Outflow: Bilateral common femoral and visualized portions of the superficial and profunda  femoral arteries are patent without evidence of aneurysm, dissection, vasculitis or significant stenosis. Veins: No obvious venous abnormality within the limitations of this arterial phase study. Review of the MIP images confirms the above findings. NON-VASCULAR Lower chest: No acute abnormality. Hepatobiliary: No focal liver abnormality is seen. No gallstones, gallbladder wall thickening, or biliary dilatation. Pancreas: Unremarkable. No pancreatic ductal dilatation or surrounding inflammatory changes. Spleen: Normal in size without focal abnormality. Adrenals/Urinary Tract: Rounded cortical hypodensity in the right kidney is too small to characterize, likely cysts. Otherwise, the kidneys, adrenal glands and bladder are within normal limits. Stomach/Bowel: No evidence for active gastrointestinal bleeding. There is some mild wall thickening versus normal under distention of the sigmoid colon. No associated inflammation. There is sigmoid colon diverticulosis. No bowel obstruction, pneumatosis or free air. The appendix is not seen. Small bowel loops are within normal limits. There is a moderate-sized hiatal hernia. Stomach is otherwise within normal limits. Lymphatic: No enlarged lymph nodes are seen. Reproductive: Status post  hysterectomy. No adnexal masses. Other: No abdominal wall hernia or abnormality. No abdominopelvic ascites. Musculoskeletal: L4-L5 posterior fusion hardware is present which appears intact. There is 6 mm of anterolisthesis at this level. Mild chronic compression deformity of L1 appears stable. IMPRESSION: 1. No evidence for active gastrointestinal bleeding. 2. Moderate calcified atherosclerotic disease of the aorta. NON-VASCULAR 1. Mild wall thickening versus normal under distention of the sigmoid colon. No associated inflammation. Correlate clinically for colitis. 2. Sigmoid colon diverticulosis. 3. Moderate-sized hiatal hernia. 4. Subcentimeter right Bosniak II renal cyst, too small to characterize. No follow-up imaging is recommended. JACR 2018 Feb; 264-273, Management of the Incidental RenalMass on CT, [employer] 2021; 814-848, Bosniak Classification of Cystic Renal Masses, Version 2019. Aortic Atherosclerosis (ICD10-I70.0). Electronically Signed   By: Ronney Asters M.D.   On: 07/25/2022 17:17       Nury Nebergall M.D. [facility] 07/26/2022, 10:57 AM  Available via Epic secure chat 7am-7pm After 7 pm, please refer to night coverage provider listed on amion.

## 2022-07-26 NOTE — TOC Initial Note (Signed)
Transition of Care [facility]) - Initial/Assessment Note    Patient Details  Name: Vanessa Boyd MRN: [id number] Date of Birth: 1941-01-15  Transition of Care [facility]) CM/SW Contact:    Tom-Johnson, Renea Ee, RN Phone Number: 07/26/2022, 2:12 PM  Clinical Narrative:                  CM spoke with patient at bedside about discharge needs. Admitted for Lower GI bleed. Hgb on admission was 8.2, down trended to 7.1. 1 Unit PRBC given. Hgb went up to 8.0 then down to 7.1 again. 2 Units PRBC transfusing.    CTA GI bleed negative for source of bleeding. Plan to have Colonoscopy tomorrow, GI following. From home alone, has three adult and supporting sons. Independent with care and drive self prior to admission. Has a cane, walker, shower seat and walk-in shower at home.  PCP is Donnajean Lopes, MD and uses [facility] pharmacy on [address]  No TOC needs or recommendations noted at this time. CM will continue to follow as patient progresses towards discharge.   Expected Discharge Plan: Home/Self Care Barriers to Discharge: Continued Medical Work up   Patient Goals and CMS Choice Patient states their goals for this hospitalization and ongoing recovery are:: To return home CMS Medicare.gov Compare Post Acute Care list provided to:: Patient Choice offered to / list presented to : NA  Expected Discharge Plan and Services Expected Discharge Plan: Home/Self Care   Discharge Planning Services: CM Consult Post Acute Care Choice: NA Living arrangements for the past 2 months: Single Family Home                 DME Arranged: N/A DME Agency: NA       HH Arranged: NA HH Agency: NA        Prior Living Arrangements/Services Living arrangements for the past 2 months: Single Family Home Lives with:: Self Patient language and need for interpreter reviewed:: Yes Do you feel safe going back to the place where you live?: Yes      Need for Family Participation in Patient Care: Yes  (Comment) Care giver support system in place?: Yes (comment) Current home services: DME (Cane, walker, shower seat, walk-in shower.) Criminal Activity/Legal Involvement Pertinent to Current Situation/Hospitalization: No - Comment as needed  Activities of Daily Living Home Assistive Devices/Equipment: Blood pressure cuff, Grab bars in shower, Hand-held shower hose ADL Screening (condition at time of admission) Patient's cognitive ability adequate to safely complete daily activities?: Yes Is the patient deaf or have difficulty hearing?: No Does the patient have difficulty seeing, even when wearing glasses/contacts?: No Does the patient have difficulty concentrating, remembering, or making decisions?: No Patient able to express need for assistance with ADLs?: Yes Does the patient have difficulty dressing or bathing?: No Independently performs ADLs?: Yes (appropriate for developmental age) Does the patient have difficulty walking or climbing stairs?: No Weakness of Legs: None Weakness of Arms/Hands: None  Permission Sought/Granted Permission sought to share information with : Case Manager, Family Supports Permission granted to share information with : Yes, Verbal Permission Granted              Emotional Assessment Appearance:: Appears stated age Attitude/Demeanor/Rapport: Engaged, Gracious Affect (typically observed): Accepting, Appropriate, Calm, Hopeful, Pleasant Orientation: : Oriented to Self, Oriented to Place, Oriented to  Time, Oriented to Situation Alcohol / Substance Use: Not Applicable Psych Involvement: No (comment)  Admission diagnosis:  Lower GI bleed [K92.2] BRBPR (bright red blood per rectum) [W09.[place]  Anemia, unspecified type [D64.9] Patient Active Problem List   Diagnosis Date Noted   Acute blood loss anemia 07/25/2022   Controlled type 2 diabetes mellitus with diabetic neuropathy ([place]) 07/25/2022   Hyperlipidemia 07/25/2022   GERD (gastroesophageal reflux  disease) 07/25/2022   Lower GI bleed 07/24/2022   Spondylolisthesis of lumbar region 06/08/2020   HYPERTHYROIDISM 12/13/2010   HYPOGLYCEMIA, UNSPECIFIED 12/13/2010   UNSPECIFIED ESSENTIAL HYPERTENSION 12/13/2010   OSTEOPOROSIS 12/13/2010   PALPITATIONS 12/13/2010   PCP:  Donnajean Lopes, MD Pharmacy:   [facility] [place], [place] - [address] AT [facility] [address] [place] [address] Phone: [phone] Fax: [phone]     Social Determinants of Health (SDOH) Interventions    Readmission Risk Interventions     No data to display

## 2022-07-27 ENCOUNTER — Inpatient Hospital Stay (HOSPITAL_COMMUNITY): Payer: Medicare Other | Admitting: Certified Registered Nurse Anesthetist

## 2022-07-27 ENCOUNTER — Encounter (HOSPITAL_COMMUNITY)
Admission: EM | Disposition: A | Discharge status: Home / Self Care | Payer: Self-pay | Source: Home / Self Care | Attending: Internal Medicine

## 2022-07-27 DIAGNOSIS — K921 Melena: Secondary | ICD-10-CM

## 2022-07-27 DIAGNOSIS — E119 Type 2 diabetes mellitus without complications: Secondary | ICD-10-CM

## 2022-07-27 DIAGNOSIS — I10 Essential (primary) hypertension: Secondary | ICD-10-CM

## 2022-07-27 DIAGNOSIS — K5730 Diverticulosis of large intestine without perforation or abscess without bleeding: Secondary | ICD-10-CM

## 2022-07-27 DIAGNOSIS — Z7984 Long term (current) use of oral hypoglycemic drugs: Secondary | ICD-10-CM

## 2022-07-27 DIAGNOSIS — K625 Hemorrhage of anus and rectum: Secondary | ICD-10-CM

## 2022-07-27 DIAGNOSIS — K648 Other hemorrhoids: Secondary | ICD-10-CM

## 2022-07-27 HISTORY — PX: COLONOSCOPY WITH PROPOFOL: SHX5780

## 2022-07-27 LAB — TYPE AND SCREEN
ABO/RH(D): A POS
Antibody Screen: NEGATIVE
Unit division: 0
Unit division: 0
Unit division: 0

## 2022-07-27 LAB — CBC
HCT: 33.1 % — ABNORMAL LOW (ref 36.0–46.0)
Hemoglobin: 11.2 g/dL — ABNORMAL LOW (ref 12.0–15.0)
MCH: 30.6 pg (ref 26.0–34.0)
MCHC: 33.8 g/dL (ref 30.0–36.0)
MCV: 90.4 fL (ref 80.0–100.0)
Platelets: 251 10*3/uL (ref 150–400)
RBC: 3.66 MIL/uL — ABNORMAL LOW (ref 3.87–5.11)
RDW: 15.3 % (ref 11.5–15.5)
WBC: 5.9 10*3/uL (ref 4.0–10.5)
nRBC: 0 % (ref 0.0–0.2)

## 2022-07-27 LAB — BPAM RBC
Blood Product Expiration Date: 202310262359
Blood Product Expiration Date: 202310262359
Blood Product Expiration Date: 202310262359
ISSUE DATE / TIME: 202310041728
ISSUE DATE / TIME: 202310051029
ISSUE DATE / TIME: 202310051325
Unit Type and Rh: 6200
Unit Type and Rh: 6200
Unit Type and Rh: 6200

## 2022-07-27 LAB — GLUCOSE, CAPILLARY: Glucose-Capillary: 101 mg/dL — ABNORMAL HIGH (ref 70–99)

## 2022-07-27 SURGERY — COLONOSCOPY WITH PROPOFOL
Anesthesia: Monitor Anesthesia Care

## 2022-07-27 MED ORDER — LACTATED RINGERS IV SOLN: INTRAVENOUS | Status: DC | PRN

## 2022-07-27 MED ORDER — EPHEDRINE SULFATE-NACL 50-0.9 MG/10ML-% IV SOSY
PREFILLED_SYRINGE | INTRAVENOUS | Status: DC | PRN
  Administered 2022-07-27: 10 mg via INTRAVENOUS

## 2022-07-27 MED ORDER — SODIUM CHLORIDE 0.9 % IV SOLN
125.0000 mg | Freq: Once | INTRAVENOUS | Status: AC
  Administered 2022-07-27: 125 mg via INTRAVENOUS
  Filled 2022-07-27: qty 10

## 2022-07-27 MED ORDER — DEXTROSE-NACL 5-0.45 % IV SOLN: INTRAVENOUS | Status: AC

## 2022-07-27 MED ORDER — PHENYLEPHRINE 80 MCG/ML (10ML) SYRINGE FOR IV PUSH (FOR BLOOD PRESSURE SUPPORT)
PREFILLED_SYRINGE | INTRAVENOUS | Status: DC | PRN
  Administered 2022-07-27 (×2): 40 ug via INTRAVENOUS

## 2022-07-27 MED ORDER — PROPOFOL 10 MG/ML IV BOLUS
INTRAVENOUS | Status: DC | PRN
  Administered 2022-07-27: 30 mg via INTRAVENOUS

## 2022-07-27 MED ORDER — PROPOFOL 500 MG/50ML IV EMUL
INTRAVENOUS | Status: DC | PRN
  Administered 2022-07-27: 75 ug/kg/min via INTRAVENOUS

## 2022-07-27 SURGICAL SUPPLY — 22 items

## 2022-07-27 NOTE — Anesthesia Postprocedure Evaluation (Signed)
Anesthesia Post Note  Patient: Vanessa Boyd  Procedure(s) Performed: COLONOSCOPY WITH PROPOFOL     Patient location during evaluation: Endoscopy Anesthesia Type: MAC Level of consciousness: oriented, patient cooperative and awake Pain management: pain level controlled Vital Signs Assessment: post-procedure vital signs reviewed and stable Respiratory status: spontaneous breathing, nonlabored ventilation and respiratory function stable Cardiovascular status: blood pressure returned to baseline and stable Postop Assessment: no apparent nausea or vomiting Anesthetic complications: no   No notable events documented.  Last Vitals:  Vitals:   07/27/22 1535 07/27/22 1545  BP: (!) 102/52 (!) 116/59  Pulse: 81 78  Resp: 19 14  Temp: 36.4 C   SpO2: 96% 94%    Last Pain:  Vitals:   07/27/22 1545  TempSrc:   PainSc: 0-No pain                 Jaslene Marsteller,E. Calin Fantroy

## 2022-07-27 NOTE — Op Note (Signed)
[facility] Patient Name: Vanessa Boyd Procedure Date : 07/27/2022 MRN: [id number] Attending MD: Particia Lather ,  Date of Birth: [birth year] CSN: [id number] Age: 81 Admit Type: Inpatient Procedure:                Colonoscopy Indications:              Hematochezia Providers:                Madelyn Brunner" Willadean Carol, RN, Priscella Mann, Technician Referring MD:             Hospitalist team Medicines:                Monitored Anesthesia Care Complications:            No immediate complications. Estimated Blood Loss:     Estimated blood loss: none. Procedure:                Pre-Anesthesia Assessment:                           - Prior to the procedure, a History and Physical                            was performed, and patient medications and                            allergies were reviewed. The patient's tolerance of                            previous anesthesia was also reviewed. The risks                            and benefits of the procedure and the sedation                            options and risks were discussed with the patient.                            All questions were answered, and informed consent                            was obtained. Prior Anticoagulants: The patient has                            taken no previous anticoagulant or antiplatelet                            agents. ASA Grade Assessment: III - A patient with                            severe systemic disease. After reviewing the risks                            and  benefits, the patient was deemed in                            satisfactory condition to undergo the procedure.                           After obtaining informed consent, the colonoscope                            was passed under direct vision. Throughout the                            procedure, the patient's blood pressure, pulse, and                            oxygen saturations  were monitored continuously. The                            [id number] Olympus peds colonoscope was                            introduced through the anus and advanced to the the                            cecum, identified by appendiceal orifice and                            ileocecal valve. The patient tolerated the                            procedure well. The colonoscopy was technically                            difficult and complex due to multiple diverticula                            in the colon. Successful completion of the                            procedure was aided by withdrawing the scope and                            replacing with the enteroscope. The quality of the                            bowel preparation was inadequate. The ileocecal                            valve, appendiceal orifice, and rectum were                            photographed. Scope In: 2:20:53 PM Scope Out: 3:26:34 PM Scope Withdrawal Time: 0 hours 14 minutes 4 seconds  Total Procedure Duration: 1 hour 5 minutes 41 seconds  Findings:      Multiple small and large-mouthed diverticula were found in the sigmoid       colon. Narrowing of the colon was seen in the sigmoid colon.      Non-bleeding internal hemorrhoids were found. Impression:               - Preparation of the colon was inadequate.                           - Diverticulosis in the sigmoid colon.                           - Non-bleeding internal hemorrhoids.                           - No specimens collected. Recommendation:           - Discharge patient to home (with escort).                           - It is suspected that the patient had bleeding due                            to a diverticular bleed, which has now resolved.                           - Patient already has clinic follow up with Dr.                            Carlean Purl in 09/2022.                           - The findings and recommendations were discussed                             with the patient. Procedure Code(s):        --- Professional ---                           [phone], Colonoscopy, flexible; diagnostic, including                            collection of specimen(s) by brushing or washing,                            when performed (separate procedure) Diagnosis Code(s):        --- Professional ---                           [id number], Other hemorrhoids                           K92.1, Melena (includes Hematochezia)                           K57.30, Diverticulosis of large intestine without  perforation or abscess without bleeding CPT copyright 2019 American Medical Association. All rights reserved. The codes documented in this report are preliminary and upon coder review may  be revised to meet current compliance requirements. Dr Georgian Co "Lyndee Leo" Lorenso Courier,  07/27/2022 3:40:48 PM Number of Addenda: 0

## 2022-07-27 NOTE — Interval H&P Note (Signed)
History and Physical Interval Note:  07/27/2022 1:59 PM  Vanessa Boyd  has presented today for surgery, with the diagnosis of Hematochezia.  The various methods of treatment have been discussed with the patient and family. After consideration of risks, benefits and other options for treatment, the patient has consented to  Procedure(s): COLONOSCOPY WITH PROPOFOL (N/A) as a surgical intervention.  The patient's history has been reviewed, patient examined, no change in status, stable for surgery.  I have reviewed the patient's chart and labs.  Questions were answered to the patient's satisfaction.     Sharyn Creamer

## 2022-07-27 NOTE — Plan of Care (Signed)
  Problem: Education: Goal: Knowledge of General Education information will improve Description: Including pain rating scale, medication(s)/side effects and non-pharmacologic comfort measures Outcome: Completed/Met   Problem: Health Behavior/Discharge Planning: Goal: Ability to manage health-related needs will improve Outcome: Completed/Met   Problem: Clinical Measurements: Goal: Will remain free from infection Outcome: Completed/Met Goal: Diagnostic test results will improve Outcome: Completed/Met Goal: Respiratory complications will improve Outcome: Completed/Met Goal: Cardiovascular complication will be avoided Outcome: Completed/Met   Problem: Activity: Goal: Risk for activity intolerance will decrease Outcome: Completed/Met   Problem: Nutrition: Goal: Adequate nutrition will be maintained Outcome: Completed/Met   Problem: Coping: Goal: Level of anxiety will decrease Outcome: Completed/Met   Problem: Elimination: Goal: Will not experience complications related to bowel motility Outcome: Completed/Met Goal: Will not experience complications related to urinary retention Outcome: Completed/Met   Problem: Pain Managment: Goal: General experience of comfort will improve Outcome: Completed/Met   Problem: Safety: Goal: Ability to remain free from injury will improve Outcome: Completed/Met   Problem: Skin Integrity: Goal: Risk for impaired skin integrity will decrease Outcome: Completed/Met   Problem: Education: Goal: Ability to identify signs and symptoms of gastrointestinal bleeding will improve Outcome: Completed/Met   Problem: Bowel/Gastric: Goal: Will show no signs and symptoms of gastrointestinal bleeding Outcome: Completed/Met   Problem: Fluid Volume: Goal: Will show no signs and symptoms of excessive bleeding Outcome: Completed/Met   Problem: Clinical Measurements: Goal: Complications related to the disease process, condition or treatment will be  avoided or minimized Outcome: Completed/Met

## 2022-07-27 NOTE — Transfer of Care (Signed)
Immediate Anesthesia Transfer of Care Note  Patient: Vanessa Boyd  Procedure(s) Performed: COLONOSCOPY WITH PROPOFOL  Patient Location: ENDO  Anesthesia Type:MAC  Level of Consciousness: awake, alert  and oriented  Airway & Oxygen Therapy: Patient Spontanous Breathing  Post-op Assessment: Report given to RN and Post -op Vital signs reviewed and stable  Post vital signs: Reviewed and stable  Last Vitals:  Vitals Value Taken Time  BP 102/52 07/27/22 1535  Temp    Pulse 83 07/27/22 1535  Resp 14 07/27/22 1535  SpO2 95 % 07/27/22 1535  Vitals shown include unvalidated device data.  Last Pain:  Vitals:   07/27/22 1306  TempSrc: Temporal  PainSc: 0-No pain         Complications: No notable events documented.

## 2022-07-27 NOTE — Progress Notes (Signed)
IV team unable to start an IV. Patient was stuck twice and refusing the third one. Unable to restart her IV fluids. Will update MD .

## 2022-07-27 NOTE — Progress Notes (Signed)
[facility]                                                                              Lyman, is a 81 y.o. female, DOB - 03/30/1941, KA:[id number] Admit date - 07/24/2022    Outpatient Primary MD for the patient is Donnajean Lopes, MD  LOS - 1  days  Chief Complaint  Patient presents with   Abnormal Lab       Brief summary   Patient is a 81 year old female with HTN, HLP, diabetes mellitus type 2, IBS with severe diverticulosis, GERD presented with intermittent bright red blood per rectum for the last week.  Episodes would occur up to 5 times daily and seems to be progressively heavier.  Patient also noted associated symptoms of some LLQ tenderness no lightheadedness shortness of breath, chest pain nausea vomiting.  Not on aspirin or blood thinners. She had gone to urgent care and was prescribed Augmentin due to concern for diverticulitis and then sent to ED for further evaluation. In [facility], on the morning of admission she reported another " gushing of blood".  Last colonoscopy in 2020 had shown severe diverticulosis. In ED, afebrile, mild tachypnea, vital signs otherwise stable.  Initial hemoglobin 8.9-> 7.1, received 1 unit of packed RBCs improved to 8.0, but then had bleeding in ED.   Assessment & Plan    Principal Problem: Acute lower GI bleed, acute blood loss anemia, painless BRBPR in the setting of known diverticulosis -Likely diverticular bleeding -CT angiogram showed no active GI bleed -s/p total 3 units packed RBC transfusions, hemoglobin 11.2 -Anemia panel showed ferritin 9, percent saturation ratio 10, iron 42 -Plan for colonoscopy today  Active Problems:  Iron deficiency anemia - Anemia panel showed ferritin 9, percent saturation ratio 10, iron 42 -Will give 1 dose of ferric gluconate IV 125 mg x 1  NIDDM, controlled type 2 diabetes mellitus with diabetic neuropathy (HCC) -Continue to hold metformin -Hemoglobin A1c 5.4 -Continue  Neurontin, will place on carb modified diet after colonoscopy    Hyperlipidemia -Continue Zocor    GERD (gastroesophageal reflux disease) -Continue Protonix   Code Status: Full code old DVT Prophylaxis:  SCDs Start: 07/25/22 1446   Level of Care: Level of care: Telemetry Medical Family Communication: Updated patient's son, Shanon Brow and his wife on the phone on 10/5   Disposition Plan:      Remains inpatient appropriate:  awaiting further GI work up    Procedures:  CTA abdomen  Consultants:   GI   Antimicrobials: None    Medications  [MAR Hold] sodium chloride   Intravenous Once   [MAR Hold] gabapentin  600 mg Oral BID   [MAR Hold] insulin aspart  0-9 Units Subcutaneous TID WC   [MAR Hold] pantoprazole  40 mg Oral Daily   [MAR Hold] simvastatin  40 mg Oral Daily      Subjective:   Vanessa Boyd was seen and examined today.  No acute bleeding today.  Overnight due to colon prep, states no bleeding noted.  No nausea vomiting abdominal pain Objective:   Vitals:   07/26/22 2033 07/27/22 0510 07/27/22 0914 07/27/22  1306  BP: (!) 126/96 (!) 141/86 (!) 120/51 120/69  Pulse: 72 (!) 101 71 67  Resp: 18 18 18  (!) 23  Temp: 98.1 F (36.7 C) 98.2 F (36.8 C) 97.9 F (36.6 C) 98 F (36.7 C)  TempSrc:   Oral Temporal  SpO2: 100% 100% 100% 96%  Weight:      Height:        Intake/Output Summary (Last 24 hours) at 07/27/2022 1459 Last data filed at 07/27/2022 0510 Gross per 24 hour  Intake 270.09 ml  Output 0 ml  Net 270.09 ml     Wt Readings from Last 3 Encounters:  07/25/22 61.4 kg  06/05/22 64.4 kg  04/16/22 64.4 kg   Physical Exam General: Alert and oriented x 3, NAD Cardiovascular: S1 S2 clear, RRR.  Respiratory: CTAB, no wheezing Gastrointestinal: Soft, nontender, nondistended, NBS Ext: no pedal edema bilaterally Psych: Normal affect    Data Reviewed:  I have personally reviewed following labs    CBC Lab Results  Component Value Date   WBC 5.9  07/27/2022   RBC 3.66 (L) 07/27/2022   HGB 11.2 (L) 07/27/2022   HCT 33.1 (L) 07/27/2022   MCV 90.4 07/27/2022   MCH 30.6 07/27/2022   PLT 251 07/27/2022   MCHC 33.8 07/27/2022   RDW 15.3 07/27/2022   LYMPHSABS 0.8 07/24/2022   MONOABS 0.5 07/24/2022   EOSABS 0.1 07/24/2022   BASOSABS 0.0 [id number]     Last metabolic panel Lab Results  Component Value Date   NA 141 07/26/2022   K 3.8 07/26/2022   CL 114 (H) 07/26/2022   CO2 23 07/26/2022   BUN 13 07/26/2022   CREATININE 0.53 07/26/2022   GLUCOSE 100 (H) 07/26/2022   GFRNONAA >60 07/26/2022   GFRAA >60 06/09/2020   CALCIUM 7.8 (L) 07/26/2022   PROT 7.1 07/24/2022   ALBUMIN 4.3 07/24/2022   BILITOT 0.2 (L) 07/24/2022   ALKPHOS 52 07/24/2022   AST 21 07/24/2022   ALT 16 07/24/2022   ANIONGAP 4 (L) 07/26/2022    CBG (last 3)  Recent Labs    07/26/22 2031 07/27/22 0743  GLUCAP 106* 101*      Coagulation Profile: No results for input(s): "INR", "PROTIME" in the last 168 hours.   Radiology Studies: I have personally reviewed the imaging studies  CT ANGIO GI BLEED  Result Date: 07/25/2022 CLINICAL DATA:  Lower GI bleeding. EXAM: CTA ABDOMEN AND PELVIS WITHOUT AND WITH CONTRAST TECHNIQUE: Multidetector CT imaging of the abdomen and pelvis was performed using the standard protocol during bolus administration of intravenous contrast. Multiplanar reconstructed images and MIPs were obtained and reviewed to evaluate the vascular anatomy. RADIATION DOSE REDUCTION: This exam was performed according to the departmental dose-optimization program which includes automated exposure control, adjustment of the mA and/or kV according to patient size and/or use of iterative reconstruction technique. CONTRAST:  75mL OMNIPAQUE IOHEXOL 350 MG/ML SOLN COMPARISON:  MRI lumbar spine 04/19/2020. FINDINGS: VASCULAR Aorta: Normal caliber aorta without aneurysm, dissection, vasculitis or significant stenosis. There is moderate calcified  atherosclerotic disease throughout the aorta. Celiac: Patent without evidence of aneurysm, dissection, vasculitis or significant stenosis. SMA: Patent without evidence of aneurysm, dissection, vasculitis or significant stenosis. Renals: Both renal arteries are patent without evidence of aneurysm, dissection, vasculitis, fibromuscular dysplasia or significant stenosis. IMA: Patent without evidence of aneurysm, dissection, vasculitis or significant stenosis. Inflow: Patent without evidence of aneurysm, dissection, vasculitis or significant stenosis. Proximal Outflow: Bilateral common femoral and visualized portions of the superficial  and profunda femoral arteries are patent without evidence of aneurysm, dissection, vasculitis or significant stenosis. Veins: No obvious venous abnormality within the limitations of this arterial phase study. Review of the MIP images confirms the above findings. NON-VASCULAR Lower chest: No acute abnormality. Hepatobiliary: No focal liver abnormality is seen. No gallstones, gallbladder wall thickening, or biliary dilatation. Pancreas: Unremarkable. No pancreatic ductal dilatation or surrounding inflammatory changes. Spleen: Normal in size without focal abnormality. Adrenals/Urinary Tract: Rounded cortical hypodensity in the right kidney is too small to characterize, likely cysts. Otherwise, the kidneys, adrenal glands and bladder are within normal limits. Stomach/Bowel: No evidence for active gastrointestinal bleeding. There is some mild wall thickening versus normal under distention of the sigmoid colon. No associated inflammation. There is sigmoid colon diverticulosis. No bowel obstruction, pneumatosis or free air. The appendix is not seen. Small bowel loops are within normal limits. There is a moderate-sized hiatal hernia. Stomach is otherwise within normal limits. Lymphatic: No enlarged lymph nodes are seen. Reproductive: Status post hysterectomy. No adnexal masses. Other: No  abdominal wall hernia or abnormality. No abdominopelvic ascites. Musculoskeletal: L4-L5 posterior fusion hardware is present which appears intact. There is 6 mm of anterolisthesis at this level. Mild chronic compression deformity of L1 appears stable. IMPRESSION: 1. No evidence for active gastrointestinal bleeding. 2. Moderate calcified atherosclerotic disease of the aorta. NON-VASCULAR 1. Mild wall thickening versus normal under distention of the sigmoid colon. No associated inflammation. Correlate clinically for colitis. 2. Sigmoid colon diverticulosis. 3. Moderate-sized hiatal hernia. 4. Subcentimeter right Bosniak II renal cyst, too small to characterize. No follow-up imaging is recommended. JACR 2018 Feb; 264-273, Management of the Incidental RenalMass on CT, [employer] 2021; [phone], Bosniak Classification of Cystic Renal Masses, Version 2019. Aortic Atherosclerosis (ICD10-I70.0). Electronically Signed   By: Ronney Asters M.D.   On: 07/25/2022 17:17       Abeer Deskins M.D. [facility] 07/27/2022, 2:59 PM  Available via Epic secure chat 7am-7pm After 7 pm, please refer to night coverage provider listed on amion.

## 2022-07-27 NOTE — Anesthesia Preprocedure Evaluation (Signed)
Anesthesia Evaluation  Patient identified by MRN, date of birth, ID band Patient awake    Reviewed: Allergy & Precautions, NPO status , Patient's Chart, lab work & pertinent test results  History of Anesthesia Complications Negative for: history of anesthetic complications  Airway Mallampati: II  TM Distance: >3 FB Neck ROM: Full    Dental  (+) Edentulous Lower, Edentulous Upper   Pulmonary former smoker   Pulmonary exam normal        Cardiovascular hypertension, Normal cardiovascular exam     Neuro/Psych negative neurological ROS  negative psych ROS   GI/Hepatic Neg liver ROS,GERD  Medicated and Controlled,,GIB   Endo/Other  diabetes, Type 2, Oral Hypoglycemic Agents Hyperthyroidism   Renal/GU negative Renal ROS  negative genitourinary   Musculoskeletal negative musculoskeletal ROS (+)    Abdominal   Peds  Hematology  (+) Blood dyscrasia (Hgb 11.2), anemia   Anesthesia Other Findings Day of surgery medications reviewed with patient.  Reproductive/Obstetrics negative OB ROS                             Anesthesia Physical Anesthesia Plan  ASA: 2 and emergent  Anesthesia Plan: MAC   Post-op Pain Management: Minimal or no pain anticipated   Induction:   PONV Risk Score and Plan: Treatment may vary due to age or medical condition and Propofol infusion  Airway Management Planned: Natural Airway and Nasal Cannula  Additional Equipment: None  Intra-op Plan:   Post-operative Plan:   Informed Consent: I have reviewed the patients History and Physical, chart, labs and discussed the procedure including the risks, benefits and alternatives for the proposed anesthesia with the patient or authorized representative who has indicated his/her understanding and acceptance.       Plan Discussed with: CRNA  Anesthesia Plan Comments:        Anesthesia Quick Evaluation

## 2022-07-28 LAB — HEMOGLOBIN AND HEMATOCRIT, BLOOD
HCT: 29.9 % — ABNORMAL LOW (ref 36.0–46.0)
Hemoglobin: 10 g/dL — ABNORMAL LOW (ref 12.0–15.0)

## 2022-07-28 LAB — CBC
HCT: 27 % — ABNORMAL LOW (ref 36.0–46.0)
Hemoglobin: 9.3 g/dL — ABNORMAL LOW (ref 12.0–15.0)
MCH: 30.9 pg (ref 26.0–34.0)
MCHC: 34.4 g/dL (ref 30.0–36.0)
MCV: 89.7 fL (ref 80.0–100.0)
Platelets: 227 10*3/uL (ref 150–400)
RBC: 3.01 MIL/uL — ABNORMAL LOW (ref 3.87–5.11)
RDW: 14.5 % (ref 11.5–15.5)
WBC: 14.8 10*3/uL — ABNORMAL HIGH (ref 4.0–10.5)
nRBC: 0 % (ref 0.0–0.2)

## 2022-07-28 LAB — GLUCOSE, CAPILLARY: Glucose-Capillary: 98 mg/dL (ref 70–99)

## 2022-07-28 NOTE — Progress Notes (Signed)
DISCHARGE NOTE HOME Vanessa Boyd to be discharged Home per MD order. Discussed prescriptions and follow up appointments with the patient. Prescriptions given to patient; medication list explained in detail. Patient verbalized understanding.  Skin clean, dry and intact without evidence of skin break down, no evidence of skin tears noted. IV catheter discontinued intact. Site without signs and symptoms of complications. Dressing and pressure applied. Pt denies pain at the site currently. No complaints noted.  Patient free of lines, drains, and wounds.   An After Visit Summary (AVS) was printed and given to the patient. Patient escorted via wheelchair, and discharged home via private auto.  Arlyss Repress, RN

## 2022-07-28 NOTE — Progress Notes (Signed)
[facility] night cross cover note:   I was notified by RN that peripheral IV access has been lost on this patient who is reportedly anticipated to be discharged on 07/28/2022.  Patient refusing reestablishment of IV access at this time.  Not due for any scheduled IV medications or IV fluids.  Relative hypotension noted this morning, while other vital signs stable, including heart rates in the 80s, afebrile.  Patient remains asymptomatic. Does not appear that she is due for any scheduled antihypertensive medications this morning.  Review of morning labs reveals a two-point drop in interval hemoglobin relative to yesterday following transfusion of 2 units PRBC.   I have ordered repeat hemoglobin level to be checked at 8 AM. additional evaluation of low blood pressure via order for ekg.        Babs Bertin, DO Hospitalist

## 2022-07-28 NOTE — Discharge Summary (Signed)
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  Episodes would occur up to 5 times daily and seems to be progressively heavier.  Patient also noted associated symptoms of some LLQ tenderness no lightheadedness shortness of breath, chest pain nausea vomiting.  Not on aspirin or blood thinners. She had gone to urgent care and was prescribed Augmentin due to concern for diverticulitis and then sent to ED for further evaluation. In [facility], on the morning of admission she reported another " gushing of blood".  Last colonoscopy in 2020 had shown severe diverticulosis. In ED, afebrile, mild tachypnea, vital signs otherwise stable.  Initial hemoglobin 8.9-> 7.1, received 1 unit of packed RBCs improved to 8.0, but then had bleeding in ED.   Assessment and Plan:  Acute lower GI bleed, acute blood loss anemia, painless BRBPR in the setting of known diverticulosis -Likely diverticular bleeding.  Hemoglobin 8.2 on admission however trended down to 7.1 due to active bleeding. -CT angiogram showed no active GI bleed -s/p total 3 units packed RBC transfusions, hemoglobin 10.0  on discharge -Anemia panel showed ferritin 9, percent saturation ratio 10, iron 42 -Underwent colonoscopy on 10/6, multiple small and large mouth diverticula in the sigmoid colon, nonbleeding internal hemorrhoids but no active bleeding      Iron deficiency anemia - Anemia panel showed ferritin 9, percent saturation ratio 10, iron 42 -Received 1 dose of ferric gluconate IV 125 mg x 1 on 10/6   NIDDM, controlled type 2 diabetes mellitus with diabetic neuropathy (HCC) -Hemoglobin A1c 5.4 -Resume metformin, continue Neurontin     Hyperlipidemia -Continue Zocor     GERD (gastroesophageal reflux disease) -Continue Protonix        Pain control - [place] Controlled Substance Reporting System database was reviewed. and patient was instructed, not to drive, operate heavy machinery, perform activities at heights, swimming or participation in water activities or provide baby-sitting services while on Pain, Sleep and Anxiety Medications; until their outpatient Physician has advised to do so again. Also recommended to not to take more than prescribed Pain, Sleep and Anxiety Medications.  Consultants: Gastroenterology Procedures performed:: Colonoscopy Disposition: Home Diet recommendation:  Discharge Diet Orders (From admission, onward)     Start     Ordered   07/28/22 0000  Diet - low sodium heart healthy        07/28/22 1049            DISCHARGE MEDICATION: Allergies as of 07/28/2022       Reactions   5-alpha Reductase Inhibitors    Codeine    REACTION: nausea/vomiting   Hydromorphone Other (See Comments)   Hallucinations        Medication List  STOP taking these medications    amoxicillin-clavulanate 875-125 MG tablet Commonly known as: AUGMENTIN       TAKE these medications    ascorbic acid 500 MG tablet Commonly known as: VITAMIN C Take 500 mg by mouth daily.   CENTRUM SILVER PO Take 1 tablet by mouth daily.   cyclobenzaprine 10 MG  tablet Commonly known as: FLEXERIL Take 1 tablet (10 mg total) by mouth 3 (three) times daily as needed for muscle spasms.   denosumab 60 MG/ML Soln injection Commonly known as: PROLIA Inject 60 mg into the skin every 6 (six) months. Administer in upper arm, thigh, or abdomen   gabapentin 600 MG tablet Commonly known as: NEURONTIN Take 600 mg by mouth 2 (two) times daily.   metFORMIN 500 MG 24 hr tablet Commonly known as: GLUCOPHAGE-XR Take 500 mg by mouth daily with breakfast.   Naproxen Sodium 220 MG Caps Take 440 mg by mouth daily as needed (pain,headache).   omeprazole 20 MG capsule Commonly known as: PRILOSEC TAKE 1 CAPSULE(20 MG) BY MOUTH DAILY   simvastatin 40 MG tablet Commonly known as: ZOCOR Take 40 mg by mouth daily.   Vitamin D3 50 MCG (2000 UT) capsule Take 2,000 Units by mouth daily.        Follow-up Information     Donnajean Lopes, MD. Schedule an appointment as soon as possible for a visit in 2 week(s).   Specialty: Internal Medicine Why: for hospital follow-up Contact information: [address] [place] [address] [phone]                Discharge Exam: Danley Danker Weights   07/24/22 1943 07/25/22 1400  Weight: 59.9 kg 61.4 kg   S: No further GI bleed, feels ready to be discharged home  BP (!) 123/52 (BP Location: Right Arm)   Pulse 98   Temp 98.2 F (36.8 C) (Oral)   Resp 18   Ht 4\' 11"  (1.499 m)   Wt 61.4 kg   SpO2 97%   BMI 27.34 kg/m    Physical Exam General: Alert and oriented x 3, NAD Cardiovascular: S1 S2 clear, RRR.  Respiratory: CTAB, no wheezing, rales or rhonchi Gastrointestinal: Soft, nontender, nondistended, NBS Ext: no pedal edema bilaterally Psych: Normal affect and demeanor, alert and oriented x3     Condition at discharge: good  The results of significant diagnostics from this hospitalization (including imaging, microbiology, ancillary and laboratory) are listed below for reference.   Imaging  Studies: CT ANGIO GI BLEED  Result Date: 07/25/2022 CLINICAL DATA:  Lower GI bleeding. EXAM: CTA ABDOMEN AND PELVIS WITHOUT AND WITH CONTRAST TECHNIQUE: Multidetector CT imaging of the abdomen and pelvis was performed using the standard protocol during bolus administration of intravenous contrast. Multiplanar reconstructed images and MIPs were obtained and reviewed to evaluate the vascular anatomy. RADIATION DOSE REDUCTION: This exam was performed according to the departmental dose-optimization program which includes automated exposure control, adjustment of the mA and/or kV according to patient size and/or use of iterative reconstruction technique. CONTRAST:  60mL OMNIPAQUE IOHEXOL 350 MG/ML SOLN COMPARISON:  MRI lumbar spine 04/19/2020. FINDINGS: VASCULAR Aorta: Normal caliber aorta without aneurysm, dissection, vasculitis or significant stenosis. There is moderate calcified atherosclerotic disease throughout the aorta. Celiac: Patent without evidence of aneurysm, dissection, vasculitis or significant stenosis. SMA: Patent without evidence of aneurysm, dissection, vasculitis or significant stenosis. Renals: Both renal arteries are patent without evidence of aneurysm, dissection, vasculitis, fibromuscular dysplasia or significant stenosis. IMA: Patent without evidence of aneurysm,  dissection, vasculitis or significant stenosis. Inflow: Patent without evidence of aneurysm, dissection, vasculitis or significant stenosis. Proximal Outflow: Bilateral common femoral and visualized portions of the superficial and profunda femoral arteries are patent without evidence of aneurysm, dissection, vasculitis or significant stenosis. Veins: No obvious venous abnormality within the limitations of this arterial phase study. Review of the MIP images confirms the above findings. NON-VASCULAR Lower chest: No acute abnormality. Hepatobiliary: No focal liver abnormality is seen. No gallstones, gallbladder wall thickening, or biliary  dilatation. Pancreas: Unremarkable. No pancreatic ductal dilatation or surrounding inflammatory changes. Spleen: Normal in size without focal abnormality. Adrenals/Urinary Tract: Rounded cortical hypodensity in the right kidney is too small to characterize, likely cysts. Otherwise, the kidneys, adrenal glands and bladder are within normal limits. Stomach/Bowel: No evidence for active gastrointestinal bleeding. There is some mild wall thickening versus normal under distention of the sigmoid colon. No associated inflammation. There is sigmoid colon diverticulosis. No bowel obstruction, pneumatosis or free air. The appendix is not seen. Small bowel loops are within normal limits. There is a moderate-sized hiatal hernia. Stomach is otherwise within normal limits. Lymphatic: No enlarged lymph nodes are seen. Reproductive: Status post hysterectomy. No adnexal masses. Other: No abdominal wall hernia or abnormality. No abdominopelvic ascites. Musculoskeletal: L4-L5 posterior fusion hardware is present which appears intact. There is 6 mm of anterolisthesis at this level. Mild chronic compression deformity of L1 appears stable. IMPRESSION: 1. No evidence for active gastrointestinal bleeding. 2. Moderate calcified atherosclerotic disease of the aorta. NON-VASCULAR 1. Mild wall thickening versus normal under distention of the sigmoid colon. No associated inflammation. Correlate clinically for colitis. 2. Sigmoid colon diverticulosis. 3. Moderate-sized hiatal hernia. 4. Subcentimeter right Bosniak II renal cyst, too small to characterize. No follow-up imaging is recommended. JACR 2018 Feb; 264-273, Management of the Incidental RenalMass on CT, Radio[employer] 2021; 814-848, Bosniak Classification of Cystic Renal Masses, Version 2019. Aortic Atherosclerosis (ICD10-I70.0). Electronically Signed   By: Ronney Asters M.D.   On: 07/25/2022 17:17    Microbiology: Results for orders placed or performed during the hospital encounter of  06/06/20  SARS CORONAVIRUS 2 (TAT 6-24 HRS) Nasopharyngeal Nasopharyngeal Swab     Status: None   Collection Time: 06/06/20 11:10 AM   Specimen: Nasopharyngeal Swab  Result Value Ref Range Status   SARS Coronavirus 2 NEGATIVE NEGATIVE Final    Comment: (NOTE) SARS-CoV-2 target nucleic acids are NOT DETECTED.  The SARS-CoV-2 RNA is generally detectable in upper and lower respiratory specimens during the acute phase of infection. Negative results do not preclude SARS-CoV-2 infection, do not rule out co-infections with other pathogens, and should not be used as the sole basis for treatment or other patient management decisions. Negative results must be combined with clinical observations, patient history, and epidemiological information. The expected result is Negative.  Fact Sheet for Patients: SugarRoll.be  Fact Sheet for Healthcare Providers: https://www.woods-mathews.com/  This test is not yet approved or cleared by the [place] FDA and  has been authorized for detection and/or diagnosis of SARS-CoV-2 by FDA under an Emergency Use Authorization (EUA). This EUA will remain  in effect (meaning this test can be used) for the duration of the COVID-19 declaration under Se ction 564(b)(1) of the Act, 21 U.S.C. section 360bbb-3(b)(1), unless the authorization is terminated or revoked sooner.  Performed at [facility], [place] [address], [place], [place] [address]     Labs: CBC: Recent Labs  Lab 07/24/22 1204 07/24/22 1945 07/25/22 1509 07/25/22 2201 07/26/22 [id number] 07/27/22 0802  07/28/22 0527 07/28/22 0801  WBC 4.4 4.9 5.8  --  5.0 5.9 14.8*  --   NEUTROABS 3.0  --   --   --   --   --   --   --   HGB 8.2* 8.9* 7.1* 8.0* 7.1* 11.2* 9.3* 10.0*  HCT 25.3* 27.5* 22.1* 23.6* 21.1* 33.1* 27.0* 29.9*  MCV 97.3 96.5 96.5  --  90.9 90.4 89.7  --   PLT 229 249 231  --  204 251 227  --    Basic Metabolic Panel: Recent Labs  Lab  07/24/22 1204 07/24/22 1945 07/25/22 1509 07/26/22 0343  NA 138 141 141 141  K 4.0 4.1 4.2 3.8  CL 105 104 112* 114*  CO2 24 27 23 23   GLUCOSE 114* 113* 116* 100*  BUN 13 15 9 13   CREATININE 0.71 0.66 0.69 0.53  CALCIUM 8.9 9.4 8.4* 7.8*   Liver Function Tests: Recent Labs  Lab 07/24/22 1945  AST 21  ALT 16  ALKPHOS 52  BILITOT 0.2*  PROT 7.1  ALBUMIN 4.3   CBG: Recent Labs  Lab 07/26/22 2031 07/27/22 0743 07/28/22 0743  GLUCAP 106* 101* 98    Discharge time spent: greater than 30 minutes.  Signed: Estill Cotta, MD [facility] 07/28/2022

## 2022-07-31 ENCOUNTER — Encounter (HOSPITAL_COMMUNITY): Payer: Self-pay | Admitting: Internal Medicine

## 2022-09-27 ENCOUNTER — Other Ambulatory Visit (INDEPENDENT_AMBULATORY_CARE_PROVIDER_SITE_OTHER): Payer: Medicare Other

## 2022-09-27 ENCOUNTER — Encounter: Payer: Self-pay | Admitting: Internal Medicine

## 2022-09-27 ENCOUNTER — Ambulatory Visit: Payer: Medicare Other | Admitting: Internal Medicine

## 2022-09-27 VITALS — BP 141/78 | HR 69 | Ht 59.0 in | Wt 139.0 lb

## 2022-09-27 DIAGNOSIS — D500 Iron deficiency anemia secondary to blood loss (chronic): Secondary | ICD-10-CM

## 2022-09-27 DIAGNOSIS — K257 Chronic gastric ulcer without hemorrhage or perforation: Secondary | ICD-10-CM | POA: Diagnosis not present

## 2022-09-27 DIAGNOSIS — K5731 Diverticulosis of large intestine without perforation or abscess with bleeding: Secondary | ICD-10-CM

## 2022-09-27 LAB — CBC WITH DIFFERENTIAL/PLATELET
Basophils Absolute: 0 10*3/uL (ref 0.0–0.1)
Basophils Relative: 0.7 % (ref 0.0–3.0)
Eosinophils Absolute: 0.2 10*3/uL (ref 0.0–0.7)
Eosinophils Relative: 3.3 % (ref 0.0–5.0)
HCT: 41.2 % (ref 36.0–46.0)
Hemoglobin: 13.6 g/dL (ref 12.0–15.0)
Lymphocytes Relative: 29.8 % (ref 12.0–46.0)
Lymphs Abs: 1.5 10*3/uL (ref 0.7–4.0)
MCHC: 33.1 g/dL (ref 30.0–36.0)
MCV: 91.7 fl (ref 78.0–100.0)
Monocytes Absolute: 0.5 10*3/uL (ref 0.1–1.0)
Monocytes Relative: 9.8 % (ref 3.0–12.0)
Neutro Abs: 2.8 10*3/uL (ref 1.4–7.7)
Neutrophils Relative %: 56.4 % (ref 43.0–77.0)
Platelets: 188 10*3/uL (ref 150.0–400.0)
RBC: 4.49 Mil/uL (ref 3.87–5.11)
RDW: 15.7 % — ABNORMAL HIGH (ref 11.5–15.5)
WBC: 4.9 10*3/uL (ref 4.0–10.5)

## 2022-09-27 LAB — FERRITIN: Ferritin: 25.5 ng/mL (ref 10.0–291.0)

## 2022-09-27 NOTE — Patient Instructions (Addendum)
Your provider has requested that you go to the basement level for lab work before leaving today. Press "B" on the elevator. The lab is located at the first door on the left as you exit the elevator.  Due to recent changes in healthcare laws, you may see the results of your imaging and laboratory studies on MyChart before your provider has had a chance to review them.  We understand that in some cases there may be results that are confusing or concerning to you. Not all laboratory results come back in the same time frame and the provider may be waiting for multiple results in order to interpret others.  Please give us 48 hours in order for your provider to thoroughly review all the results before contacting the office for clarification of your results.   I appreciate the opportunity to care for you. Carl Gessner, MD 

## 2022-09-27 NOTE — Progress Notes (Signed)
Vanessa Boyd 81 y.o. [birth year] HF:[id number]  Assessment & Plan:   Encounter Diagnoses  Name Primary?   Diverticulosis of colon with hemorrhage Yes   Iron deficiency anemia due to chronic blood loss    Cameron ulcer, chronic     Improved.  Will check CBC and ferritin level.  She has a chronic iron deficiency anemia related to her Lysbeth Galas ulcers and her hiatal hernia.  This was all reviewed today.  Follow-up here as needed otherwise.  She has chosen to restrict seeds nuts and popcorn as best she can to minimize diverticular issues though that has not required in my opinion.  Though inadequate colonoscopy prep 07/27/2022 I think given what we know about her and a colonoscopy in 2020 with good prep I would not repeat 1.    I had her on a PPI in the past because of gastritis and some NSAID use.  I am inclined to leave her on that at this point even though she has DC'd NSAIDs.  CC: Donnajean Lopes, MD   Subjective:   Chief Complaint: Follow-up after diverticular bleed  HPI 81 year old white woman returns after being hospitalized in October 2023 with lower GI bleed, she has known diverticulosis, colonoscopy and CT angio showed diverticulosis but no clear site of hemorrhage.  She has known severe sigmoid diverticulosis from prior colonoscopies (2020).  The enteroscope was used to navigate her narrowed and diverticula Ladin sigmoid colon by Dr. Lorenso Courier on 07/27/2022.  No obvious bleeding.  Prep was an adequate at that time for polyp detection.  Colonoscopy in 2020 with good prep so additional colonoscopy not needed.  Additional GI problems include known hiatal hernia with chronic Cameron ulcers and gastritis for which she is on a PPI chronically.  In the hospital her ferritin was 9, she tells me she was iron deficient at prior physical with primary care provider and iron was suggested but she did not start it then but she is on it now.  She had used some intermittent naproxen but stopped it  and is using Tylenol as needed headache.  Hospitalist had suggested eliminating seeds nuts and popcorn, so the patient is chosen to do so with respect to diverticulosis.  Lab Results  Component Value Date   FERRITIN 9 (L) 07/26/2022   Lab Results  Component Value Date   WBC 14.8 (H) 07/28/2022   HGB 10.0 (L) 07/28/2022   HCT 29.9 (L) 07/28/2022   MCV 89.7 07/28/2022   PLT 227 07/28/2022    Allergies  Allergen Reactions   5-Alpha Reductase Inhibitors    Codeine     REACTION: nausea/vomiting   Hydromorphone Other (See Comments)    Hallucinations    Current Meds  Medication Sig   ascorbic acid (VITAMIN C) 500 MG tablet Take 500 mg by mouth daily.   Cholecalciferol (VITAMIN D3) 50 MCG (2000 UT) capsule Take 2,000 Units by mouth daily.    cyclobenzaprine (FLEXERIL) 10 MG tablet Take 1 tablet (10 mg total) by mouth 3 (three) times daily as needed for muscle spasms.   denosumab (PROLIA) 60 MG/ML SOLN injection Inject 60 mg into the skin every 6 (six) months. Administer in upper arm, thigh, or abdomen   gabapentin (NEURONTIN) 600 MG tablet Take 600 mg by mouth 2 (two) times daily.   metFORMIN (GLUCOPHAGE-XR) 500 MG 24 hr tablet Take 500 mg by mouth daily with breakfast.    Multiple Vitamins-Minerals (CENTRUM SILVER PO) Take 1 tablet by mouth daily.  omeprazole (PRILOSEC) 20 MG capsule TAKE 1 CAPSULE(20 MG) BY MOUTH DAILY   simvastatin (ZOCOR) 40 MG tablet Take 40 mg by mouth daily.   Past Medical History:  Diagnosis Date   Abnormal bone density screening    Diverticulitis    Graves' disease    Hyperlipidemia    Hypertension    Hyperthyroidism    Hypoglycemia, unspecified    IBS (irritable bowel syndrome)    Obesity    Osteoporosis    Palpitations    Post-menopausal    Spinal stenosis 2017   Tachycardia    Tobacco use    Past Surgical History:  Procedure Laterality Date   ABDOMINAL HYSTERECTOMY     APPENDECTOMY     BACK SURGERY  05/2021   COLONOSCOPY WITH PROPOFOL  N/A 07/27/2022   Procedure: COLONOSCOPY WITH PROPOFOL;  Surgeon: Imogene Burn, MD;  Location: [facility] ENDOSCOPY;  Service: Gastroenterology;  Laterality: N/A;   EYE SURGERY     bilateral cataract surgery   OOPHORECTOMY     orif left elbow     SHOULDER OPEN ROTATOR CUFF REPAIR     TONSILLECTOMY     Social History   Social History Narrative   Married '62 - widowed '85; 3 sons - '63, '64, '66; 6 grandchildren. [employer]. 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 Has a good support network. End- of- Life: OK for CPR, no prolonged ventilator support, no prolong heroic or futile measures.    family history includes COPD in her mother; Hyperlipidemia in her mother and sister; Hypertension in her sister.   Review of Systems As above  Objective:   Physical Exam Blood pressure (!) 141/78, pulse 69, height 4\' 11"  (1.499 m), weight 139 lb (63 kg).  24 minutes total time spent on this visit by me

## 2022-10-19 ENCOUNTER — Ambulatory Visit: Payer: Medicare Other | Admitting: Podiatry

## 2022-10-19 ENCOUNTER — Encounter: Payer: Self-pay | Admitting: Podiatry

## 2022-10-19 DIAGNOSIS — I7300 Raynaud's syndrome without gangrene: Secondary | ICD-10-CM | POA: Diagnosis not present

## 2022-10-19 NOTE — Progress Notes (Signed)
Subjective:   Patient ID: Vanessa Boyd, female   DOB: 81 y.o.   MRN: [id number]   HPI Patient presents third digit right has become red again and she wanted it checked with history of Raynaud's   ROS      Objective:  Physical Exam  Neurovascular status intact with patient's right third digit showing distal necrosis but no ulceration secondary to Raynaud's cold exposure     Assessment:  Probability for Raynaud's right third digit with symptoms     Plan:  Good condition do not recommend aggressive treatment except for soaks in warm water today and hopefully this will heal uneventfully and I did discuss not exposing to cold

## 2022-11-09 ENCOUNTER — Other Ambulatory Visit (HOSPITAL_COMMUNITY): Payer: Self-pay | Admitting: *Deleted

## 2022-11-13 ENCOUNTER — Ambulatory Visit (HOSPITAL_COMMUNITY)
Admission: RE | Admit: 2022-11-13 | Discharge: 2022-11-13 | Disposition: A | Discharge status: Home / Self Care | Payer: Medicare Other | Source: Ambulatory Visit | Attending: Internal Medicine | Admitting: Internal Medicine

## 2022-11-13 DIAGNOSIS — M810 Age-related osteoporosis without current pathological fracture: Secondary | ICD-10-CM | POA: Diagnosis present

## 2022-11-13 MED ORDER — DENOSUMAB 60 MG/ML ~~LOC~~ SOSY
60.0000 mg | PREFILLED_SYRINGE | Freq: Once | SUBCUTANEOUS | Status: AC
  Administered 2022-11-13: 60 mg via SUBCUTANEOUS

## 2022-11-13 MED ORDER — DENOSUMAB 60 MG/ML ~~LOC~~ SOSY
PREFILLED_SYRINGE | SUBCUTANEOUS | Status: AC
  Filled 2022-11-13: qty 1

## 2023-02-28 ENCOUNTER — Other Ambulatory Visit: Payer: Self-pay | Admitting: Internal Medicine

## 2023-05-13 ENCOUNTER — Other Ambulatory Visit (HOSPITAL_COMMUNITY): Payer: Self-pay | Admitting: *Deleted

## 2023-05-14 ENCOUNTER — Ambulatory Visit (HOSPITAL_COMMUNITY)
Admission: RE | Admit: 2023-05-14 | Discharge: 2023-05-14 | Disposition: A | Discharge status: Home / Self Care | Payer: Medicare Other | Source: Ambulatory Visit | Attending: Internal Medicine | Admitting: Internal Medicine

## 2023-05-14 DIAGNOSIS — M810 Age-related osteoporosis without current pathological fracture: Secondary | ICD-10-CM | POA: Diagnosis present

## 2023-05-14 MED ORDER — DENOSUMAB 60 MG/ML ~~LOC~~ SOSY
PREFILLED_SYRINGE | SUBCUTANEOUS | Status: AC
  Administered 2023-05-14: 60 mg via SUBCUTANEOUS
  Filled 2023-05-14: qty 1

## 2023-05-14 MED ORDER — DENOSUMAB 60 MG/ML ~~LOC~~ SOSY: 60.0000 mg | PREFILLED_SYRINGE | Freq: Once | SUBCUTANEOUS | Status: AC

## 2023-07-04 ENCOUNTER — Other Ambulatory Visit: Payer: Self-pay

## 2023-07-04 ENCOUNTER — Emergency Department (HOSPITAL_BASED_OUTPATIENT_CLINIC_OR_DEPARTMENT_OTHER)
Admission: EM | Admit: 2023-07-04 | Discharge: 2023-07-05 | Disposition: A | Discharge status: Home / Self Care | Payer: Medicare Other | Attending: Emergency Medicine | Admitting: Emergency Medicine

## 2023-07-04 ENCOUNTER — Emergency Department (HOSPITAL_BASED_OUTPATIENT_CLINIC_OR_DEPARTMENT_OTHER): Payer: Medicare Other | Admitting: Radiology

## 2023-07-04 DIAGNOSIS — Z87891 Personal history of nicotine dependence: Secondary | ICD-10-CM | POA: Diagnosis not present

## 2023-07-04 DIAGNOSIS — R509 Fever, unspecified: Secondary | ICD-10-CM | POA: Diagnosis present

## 2023-07-04 DIAGNOSIS — E119 Type 2 diabetes mellitus without complications: Secondary | ICD-10-CM | POA: Insufficient documentation

## 2023-07-04 DIAGNOSIS — Z1152 Encounter for screening for COVID-19: Secondary | ICD-10-CM | POA: Diagnosis not present

## 2023-07-04 DIAGNOSIS — I10 Essential (primary) hypertension: Secondary | ICD-10-CM | POA: Insufficient documentation

## 2023-07-04 DIAGNOSIS — Z79899 Other long term (current) drug therapy: Secondary | ICD-10-CM | POA: Diagnosis not present

## 2023-07-04 DIAGNOSIS — R002 Palpitations: Secondary | ICD-10-CM | POA: Diagnosis not present

## 2023-07-04 DIAGNOSIS — Z7984 Long term (current) use of oral hypoglycemic drugs: Secondary | ICD-10-CM | POA: Diagnosis not present

## 2023-07-04 DIAGNOSIS — R531 Weakness: Secondary | ICD-10-CM | POA: Diagnosis not present

## 2023-07-04 DIAGNOSIS — R000 Tachycardia, unspecified: Secondary | ICD-10-CM | POA: Insufficient documentation

## 2023-07-04 LAB — CBC WITH DIFFERENTIAL/PLATELET
Abs Immature Granulocytes: 0.04 10*3/uL (ref 0.00–0.07)
Basophils Absolute: 0 10*3/uL (ref 0.0–0.1)
Basophils Relative: 0 %
Eosinophils Absolute: 0 10*3/uL (ref 0.0–0.5)
Eosinophils Relative: 0 %
HCT: 38 % (ref 36.0–46.0)
Hemoglobin: 12.7 g/dL (ref 12.0–15.0)
Immature Granulocytes: 0 %
Lymphocytes Relative: 9 %
Lymphs Abs: 0.9 10*3/uL (ref 0.7–4.0)
MCH: 31.4 pg (ref 26.0–34.0)
MCHC: 33.4 g/dL (ref 30.0–36.0)
MCV: 94.1 fL (ref 80.0–100.0)
Monocytes Absolute: 1 10*3/uL (ref 0.1–1.0)
Monocytes Relative: 10 %
Neutro Abs: 8.1 10*3/uL — ABNORMAL HIGH (ref 1.7–7.7)
Neutrophils Relative %: 81 %
Platelets: 182 10*3/uL (ref 150–400)
RBC: 4.04 MIL/uL (ref 3.87–5.11)
RDW: 13.2 % (ref 11.5–15.5)
WBC: 10 10*3/uL (ref 4.0–10.5)
nRBC: 0 % (ref 0.0–0.2)

## 2023-07-04 LAB — PROTIME-INR
INR: 1 (ref 0.8–1.2)
Prothrombin Time: 13 s (ref 11.4–15.2)

## 2023-07-04 LAB — SARS CORONAVIRUS 2 BY RT PCR: SARS Coronavirus 2 by RT PCR: NEGATIVE

## 2023-07-04 LAB — COMPREHENSIVE METABOLIC PANEL
ALT: 13 U/L (ref 0–44)
AST: 19 U/L (ref 15–41)
Albumin: 3.9 g/dL (ref 3.5–5.0)
Alkaline Phosphatase: 49 U/L (ref 38–126)
Anion gap: 11 (ref 5–15)
BUN: 13 mg/dL (ref 8–23)
CO2: 24 mmol/L (ref 22–32)
Calcium: 8.9 mg/dL (ref 8.9–10.3)
Chloride: 99 mmol/L (ref 98–111)
Creatinine, Ser: 0.57 mg/dL (ref 0.44–1.00)
GFR, Estimated: >60 mL/min (ref >60)
Glucose, Bld: 124 mg/dL — ABNORMAL HIGH (ref 70–99)
Potassium: 3.7 mmol/L (ref 3.5–5.1)
Sodium: 134 mmol/L — ABNORMAL LOW (ref 135–145)
Total Bilirubin: 0.6 mg/dL (ref 0.3–1.2)
Total Protein: 6.9 g/dL (ref 6.5–8.1)

## 2023-07-04 LAB — LACTIC ACID, PLASMA: Lactic Acid, Venous: 0.6 mmol/L (ref 0.5–1.9)

## 2023-07-04 MED ORDER — SODIUM CHLORIDE 0.9 % IV BOLUS
1000.0000 mL | Freq: Once | INTRAVENOUS | Status: AC
  Administered 2023-07-04: 1000 mL via INTRAVENOUS

## 2023-07-04 MED ORDER — ACETAMINOPHEN 325 MG PO TABS
650.0000 mg | ORAL_TABLET | Freq: Once | ORAL | Status: AC
  Administered 2023-07-04: 650 mg via ORAL
  Filled 2023-07-04: qty 2

## 2023-07-04 NOTE — ED Provider Notes (Signed)
[place] EMERGENCY DEPARTMENT AT [facility] Provider Note  CSN: [id number] Arrival date & time: 07/04/23 2102  Chief Complaint(s) Generalized Body Aches  HPI Vanessa Boyd is a 82 y.o. female with history of hyperlipidemia, hypertension, diabetes presenting to the emergency department with generalized weakness, fever.  Patient reports today began feeling weak with malaise, some bodyaches.  Also had fever.  Denies any other symptoms such as sore throat, runny nose,, painful urination, back pain, abdominal pain, nausea or vomiting, cough, difficulty breathing, rashes, leg swelling, or any other new symptoms.  No sick contacts.   Past Medical History Past Medical History:  Diagnosis Date   Abnormal bone density screening    Diverticulitis    Graves' disease    Hyperlipidemia    Hypertension    Hyperthyroidism    Hypoglycemia, unspecified    IBS (irritable bowel syndrome)    Obesity    Osteoporosis    Palpitations    Post-menopausal    Spinal stenosis 2017   Tachycardia    Tobacco use    Patient Active Problem List   Diagnosis Date Noted   Controlled type 2 diabetes mellitus with diabetic neuropathy (HCC) 07/25/2022   Hyperlipidemia 07/25/2022   GERD (gastroesophageal reflux disease) 07/25/2022   Spondylolisthesis of lumbar region 06/08/2020   HYPERTHYROIDISM 12/13/2010   HYPOGLYCEMIA, UNSPECIFIED 12/13/2010   UNSPECIFIED ESSENTIAL HYPERTENSION 12/13/2010   OSTEOPOROSIS 12/13/2010   PALPITATIONS 12/13/2010   Home Medication(s) Prior to Admission medications   Medication Sig Start Date End Date Taking? Authorizing Provider  ascorbic acid (VITAMIN C) 500 MG tablet Take 500 mg by mouth daily.    [provider]  Cholecalciferol (VITAMIN D3) 50 MCG (2000 UT) capsule Take 2,000 Units by mouth daily.     [provider]  cyclobenzaprine (FLEXERIL) 10 MG tablet Take 1 tablet (10 mg total) by mouth 3 (three) times daily as needed for muscle spasms.  06/11/20   Dawley, Troy C, DO  denosumab (PROLIA) 60 MG/ML SOLN injection Inject 60 mg into the skin every 6 (six) months. Administer in upper arm, thigh, or abdomen    [provider]  gabapentin (NEURONTIN) 600 MG tablet Take 600 mg by mouth 2 (two) times daily. 03/12/22   [provider]  metFORMIN (GLUCOPHAGE-XR) 500 MG 24 hr tablet Take 500 mg by mouth daily with breakfast.     [provider]  Multiple Vitamins-Minerals (CENTRUM SILVER PO) Take 1 tablet by mouth daily.      [provider]  omeprazole (PRILOSEC) 20 MG capsule TAKE 1 CAPSULE(20 MG) BY MOUTH DAILY 02/28/23   Iva Boop, MD  simvastatin (ZOCOR) 40 MG tablet Take 40 mg by mouth daily. 10/06/16   [provider]                                                                                                                                    Past  Surgical History Past Surgical History:  Procedure Laterality Date   ABDOMINAL HYSTERECTOMY     APPENDECTOMY     BACK SURGERY  05/2021   COLONOSCOPY WITH PROPOFOL N/A 07/27/2022   Procedure: COLONOSCOPY WITH PROPOFOL;  Surgeon: Imogene Burn, MD;  Location: [facility] ENDOSCOPY;  Service: Gastroenterology;  Laterality: N/A;   EYE SURGERY     bilateral cataract surgery   OOPHORECTOMY     orif left elbow     SHOULDER OPEN ROTATOR CUFF REPAIR     TONSILLECTOMY     Family History Family History  Problem Relation Age of Onset   COPD Mother    Hyperlipidemia Mother    Hypertension Sister    Hyperlipidemia Sister    Diabetes Neg Hx    Heart disease Neg Hx    Colon cancer Neg Hx    Rectal cancer Neg Hx    Stomach cancer Neg Hx    Esophageal cancer Neg Hx     Social History Social History   Tobacco Use   Smoking status: Former    Current packs/day: 0.00    Types: Cigarettes    Start date: 10/22/1978    Quit date: 10/22/1998    Years since quitting: 24.7   Smokeless tobacco: Never  Vaping Use   Vaping status: Never Used  Substance  Use Topics   Alcohol use: Yes    Alcohol/week: 2.0 standard drinks of alcohol    Types: 2 Standard drinks or equivalent per week    Comment: occasionally   Drug use: No   Allergies 5-alpha reductase inhibitors, Codeine, and Hydromorphone  Review of Systems Review of Systems  All other systems reviewed and are negative.   Physical Exam Vital Signs  I have reviewed the triage vital signs BP (!) 146/86 (BP Location: Right Arm)   Pulse (!) 141   Temp (!) 97.4 F (36.3 C)   Resp (!) 24   Ht 4\' 11"  (1.499 m)   Wt 59.9 kg   SpO2 98%   BMI 26.66 kg/m  Physical Exam Vitals and nursing note reviewed.  Constitutional:      General: She is not in acute distress.    Appearance: She is well-developed.  HENT:     Head: Normocephalic and atraumatic.     Mouth/Throat:     Mouth: Mucous membranes are moist.  Eyes:     Pupils: Pupils are equal, round, and reactive to light.  Cardiovascular:     Rate and Rhythm: Regular rhythm. Tachycardia present.     Heart sounds: No murmur heard. Pulmonary:     Effort: Pulmonary effort is normal. No respiratory distress.     Breath sounds: Normal breath sounds.  Abdominal:     General: Abdomen is flat.     Palpations: Abdomen is soft.     Tenderness: There is no abdominal tenderness. There is no right CVA tenderness or left CVA tenderness.  Musculoskeletal:        General: No tenderness.     Right lower leg: No edema.     Left lower leg: No edema.  Skin:    General: Skin is warm and dry.  Neurological:     General: No focal deficit present.     Mental Status: She is alert. Mental status is at baseline.  Psychiatric:        Mood and Affect: Mood normal.        Behavior: Behavior normal.     ED Results and Treatments Labs (all labs  ordered are listed, but only abnormal results are displayed) Labs Reviewed  COMPREHENSIVE METABOLIC PANEL - Abnormal; Notable for the following components:      Result Value   Sodium 134 (*)    Glucose,  Bld 124 (*)    All other components within normal limits  CBC WITH DIFFERENTIAL/PLATELET - Abnormal; Notable for the following components:   Neutro Abs 8.1 (*)    All other components within normal limits  SARS CORONAVIRUS 2 BY RT PCR  CULTURE, BLOOD (ROUTINE X 2)  CULTURE, BLOOD (ROUTINE X 2)  LACTIC ACID, PLASMA  PROTIME-INR  LACTIC ACID, PLASMA  URINALYSIS, W/ REFLEX TO CULTURE (INFECTION SUSPECTED)                                                                                                                          Radiology DG Chest 2 View  Result Date: 07/04/2023 CLINICAL DATA:  Suspected sepsis EXAM: CHEST - 2 VIEW COMPARISON:  None Available. FINDINGS: The heart size and mediastinal contours are within normal limits. Both lungs are clear. Lumbar fusion hardware present. IMPRESSION: No active cardiopulmonary disease. Electronically Signed   By: Darliss Cheney M.D.   On: 07/04/2023 22:52    Pertinent labs & imaging results that were available during my care of the patient were reviewed by me and considered in my medical decision making (see MDM for details).  Medications Ordered in ED Medications  acetaminophen (TYLENOL) tablet 650 mg (650 mg Oral Given 07/04/23 2132)  sodium chloride 0.9 % bolus 1,000 mL (1,000 mLs Intravenous New Bag/Given 07/04/23 2235)                                                                                                                                     Procedures Procedures  (including critical care time)  Medical Decision Making / ED Course   MDM:  82 year old female presenting to the emergency department with bodyaches, malaise and fever.  Patient overall very well-appearing, initial vital signs with tachycardia up to 141, mild tachypnea.  Patient febrile to 101.3.  Unclear cause of symptoms, she does not have any specific localizing symptoms for any infectious source.  Her exam is reassuring.  She does not really have any URI symptoms  and her COVID test is negative.  Suspect most likely cause would still possibly be a viral infection but will check labs, chest x-ray, blood  cultures, urinalysis to evaluate for potential infectious source.  No abdominal tenderness to suggest intra-abdominal infection and no nausea or vomiting.  No headache or neck stiffness to suggest meningitis.  No rashes to suggest cellulitis.  No back pain to suggest any occult back infection.  Will reassess.  Will give fluids.  Clinical Course as of 07/04/23 2327  Thu Jul 04, 2023  2326 Signed out to oncoming provider pending urinalysis, remainder of labs and re-assessment.  [WS]    Clinical Course User Index [WS] Lonell Grandchild, MD     Additional history obtained:  -External records from outside source obtained and reviewed including: Chart review including previous notes, labs, imaging, consultation notes including prior notes   Lab Tests: -I ordered, reviewed, and interpreted labs.   The pertinent results include:   Labs Reviewed  COMPREHENSIVE METABOLIC PANEL - Abnormal; Notable for the following components:      Result Value   Sodium 134 (*)    Glucose, Bld 124 (*)    All other components within normal limits  CBC WITH DIFFERENTIAL/PLATELET - Abnormal; Notable for the following components:   Neutro Abs 8.1 (*)    All other components within normal limits  SARS CORONAVIRUS 2 BY RT PCR  CULTURE, BLOOD (ROUTINE X 2)  CULTURE, BLOOD (ROUTINE X 2)  LACTIC ACID, PLASMA  PROTIME-INR  LACTIC ACID, PLASMA  URINALYSIS, W/ REFLEX TO CULTURE (INFECTION SUSPECTED)    Notable for no leukocytosis, mild neutrophilic predominance  Imaging Studies ordered: I ordered imaging studies including CXR On my interpretation imaging demonstrates no acute process I independently visualized and interpreted imaging. I agree with the radiologist interpretation   Medicines ordered and prescription drug management: Meds ordered this encounter   Medications   acetaminophen (TYLENOL) tablet 650 mg   sodium chloride 0.9 % bolus 1,000 mL    -I have reviewed the patients home medicines and have made adjustments as needed   Cardiac Monitoring: The patient was maintained on a cardiac monitor.  I personally viewed and interpreted the cardiac monitored which showed an underlying rhythm of: sinus tachycardia   Social Determinants of Health:  Diagnosis or treatment significantly limited by social determinants of health: lives alone   Reevaluation: After the interventions noted above, I reevaluated the patient and found that their symptoms have improved  Co morbidities that complicate the patient evaluation  Past Medical History:  Diagnosis Date   Abnormal bone density screening    Diverticulitis    Graves' disease    Hyperlipidemia    Hypertension    Hyperthyroidism    Hypoglycemia, unspecified    IBS (irritable bowel syndrome)    Obesity    Osteoporosis    Palpitations    Post-menopausal    Spinal stenosis 2017   Tachycardia    Tobacco use       Dispostion: Disposition decision including need for hospitalization was considered, and patient disposition pending at time of sign out.    Final Clinical Impression(s) / ED Diagnoses Final diagnoses:  Fever, unspecified fever cause     This chart was dictated using voice recognition software.  Despite best efforts to proofread,  errors can occur which can change the documentation meaning.    Lonell Grandchild, MD 07/04/23 [phone]

## 2023-07-04 NOTE — ED Notes (Signed)
Per EDP, do not collect bloodwork/ activate code sepsis until EDP assessment made.

## 2023-07-04 NOTE — ED Triage Notes (Signed)
Pt presents with generalized malaise, body aches, elevated temp (TMax 99.5) that started today. Pt denies sick contacts.

## 2023-07-05 LAB — URINALYSIS, W/ REFLEX TO CULTURE (INFECTION SUSPECTED)
Bacteria, UA: NONE SEEN
Bilirubin Urine: NEGATIVE
Glucose, UA: NEGATIVE mg/dL
Ketones, ur: NEGATIVE mg/dL
Nitrite: NEGATIVE
Protein, ur: NEGATIVE mg/dL
Specific Gravity, Urine: <1.005 — ABNORMAL LOW (ref 1.005–1.030)
pH: 7 (ref 5.0–8.0)

## 2023-07-05 NOTE — ED Provider Notes (Signed)
I assumed care of this patient from previous provider.  Please see their note for further details of history, exam, and MDM.   Briefly patient is a 82 y.o. female who presented with fever and malaise. Work up thus far reassuring. Pending UA and reassessment.  UA with leukocytes but no WBCs or bacteria concerning for UTI.  Patient defervesced and tachycardia resolved after Tylenol.  She reports feeling better and requesting to go home.  Strict return precautions given.  The patient appears reasonably screened and/or stabilized for discharge and I doubt any other medical condition or other [facility] requiring further screening, evaluation, or treatment in the ED at this time. I have discussed the findings, Dx and Tx plan with the patient/family who expressed understanding and agree(s) with the plan. Discharge instructions discussed at length. The patient/family was given strict return precautions who verbalized understanding of the instructions. No further questions at time of discharge.  Disposition: Discharge  Condition: Good  ED Discharge Orders     None         Follow Up: Garlan Fillers, MD [address] [place] [address] [phone]  Call  to schedule an appointment for close follow up         Tracey Stewart, Amadeo Garnet, MD 07/05/23 [phone]

## 2023-07-05 NOTE — ED Notes (Signed)
RN reviewed discharge instructions with pt. Pt verbalized understanding and had no further questions

## 2023-07-10 LAB — CULTURE, BLOOD (ROUTINE X 2): Culture: NO GROWTH

## 2023-11-11 ENCOUNTER — Other Ambulatory Visit (HOSPITAL_COMMUNITY): Payer: Self-pay | Admitting: *Deleted

## 2023-11-14 ENCOUNTER — Ambulatory Visit (HOSPITAL_COMMUNITY)
Admission: RE | Admit: 2023-11-14 | Discharge: 2023-11-14 | Disposition: A | Discharge status: Home / Self Care | Payer: Medicare Other | Source: Ambulatory Visit | Attending: Internal Medicine | Admitting: Internal Medicine

## 2023-11-14 DIAGNOSIS — E7522 Gaucher disease: Secondary | ICD-10-CM | POA: Insufficient documentation

## 2023-11-14 MED ORDER — DENOSUMAB 60 MG/ML ~~LOC~~ SOSY
60.0000 mg | PREFILLED_SYRINGE | Freq: Once | SUBCUTANEOUS | Status: AC
  Administered 2023-11-14: 60 mg via SUBCUTANEOUS

## 2023-11-14 MED ORDER — DENOSUMAB 60 MG/ML ~~LOC~~ SOSY
PREFILLED_SYRINGE | SUBCUTANEOUS | Status: AC
  Filled 2023-11-14: qty 1

## 2024-02-22 ENCOUNTER — Other Ambulatory Visit: Payer: Self-pay | Admitting: Internal Medicine

## 2024-03-24 ENCOUNTER — Telehealth: Payer: Self-pay | Admitting: Internal Medicine

## 2024-03-24 NOTE — Telephone Encounter (Signed)
 Patient with a history of diverticular bleeding last in 2023 who returned from dinner tonight and felt the urge to defecate and passed a moderate to large amount of blood and clots x 1.  No abdominal pain no lightheadedness or dizziness.    We discussed going to the ED now versus observing.  We have decided for her to observe and if she has recurrent bleeding she is to go to the hospital for evaluation.  If no bleeding we will follow her up tomorrow and make a follow-up visit plan

## 2024-03-25 ENCOUNTER — Other Ambulatory Visit: Payer: Self-pay

## 2024-03-25 ENCOUNTER — Emergency Department (HOSPITAL_COMMUNITY)

## 2024-03-25 ENCOUNTER — Encounter (HOSPITAL_COMMUNITY): Payer: Self-pay

## 2024-03-25 ENCOUNTER — Inpatient Hospital Stay (HOSPITAL_COMMUNITY)
Admission: EM | Admit: 2024-03-25 | Discharge: 2024-03-31 | DRG: 378 | Disposition: A | Discharge status: Home Health | Attending: Internal Medicine | Admitting: Internal Medicine

## 2024-03-25 DIAGNOSIS — Y92002 Bathroom of unspecified non-institutional (private) residence single-family (private) house as the place of occurrence of the external cause: Secondary | ICD-10-CM | POA: Diagnosis not present

## 2024-03-25 DIAGNOSIS — Z87891 Personal history of nicotine dependence: Secondary | ICD-10-CM | POA: Diagnosis not present

## 2024-03-25 DIAGNOSIS — M810 Age-related osteoporosis without current pathological fracture: Secondary | ICD-10-CM | POA: Diagnosis present

## 2024-03-25 DIAGNOSIS — E119 Type 2 diabetes mellitus without complications: Secondary | ICD-10-CM | POA: Diagnosis present

## 2024-03-25 DIAGNOSIS — Z885 Allergy status to narcotic agent status: Secondary | ICD-10-CM | POA: Diagnosis not present

## 2024-03-25 DIAGNOSIS — E039 Hypothyroidism, unspecified: Secondary | ICD-10-CM | POA: Diagnosis present

## 2024-03-25 DIAGNOSIS — W010XXA Fall on same level from slipping, tripping and stumbling without subsequent striking against object, initial encounter: Secondary | ICD-10-CM | POA: Diagnosis present

## 2024-03-25 DIAGNOSIS — E785 Hyperlipidemia, unspecified: Secondary | ICD-10-CM | POA: Diagnosis present

## 2024-03-25 DIAGNOSIS — K5731 Diverticulosis of large intestine without perforation or abscess with bleeding: Principal | ICD-10-CM

## 2024-03-25 DIAGNOSIS — D500 Iron deficiency anemia secondary to blood loss (chronic): Secondary | ICD-10-CM | POA: Diagnosis not present

## 2024-03-25 DIAGNOSIS — I959 Hypotension, unspecified: Secondary | ICD-10-CM | POA: Diagnosis present

## 2024-03-25 DIAGNOSIS — K5790 Diverticulosis of intestine, part unspecified, without perforation or abscess without bleeding: Secondary | ICD-10-CM | POA: Diagnosis not present

## 2024-03-25 DIAGNOSIS — Z825 Family history of asthma and other chronic lower respiratory diseases: Secondary | ICD-10-CM

## 2024-03-25 DIAGNOSIS — K922 Gastrointestinal hemorrhage, unspecified: Principal | ICD-10-CM | POA: Diagnosis present

## 2024-03-25 DIAGNOSIS — Z888 Allergy status to other drugs, medicaments and biological substances status: Secondary | ICD-10-CM | POA: Diagnosis not present

## 2024-03-25 DIAGNOSIS — K219 Gastro-esophageal reflux disease without esophagitis: Secondary | ICD-10-CM | POA: Diagnosis present

## 2024-03-25 DIAGNOSIS — Z7984 Long term (current) use of oral hypoglycemic drugs: Secondary | ICD-10-CM | POA: Diagnosis not present

## 2024-03-25 DIAGNOSIS — Z90710 Acquired absence of both cervix and uterus: Secondary | ICD-10-CM

## 2024-03-25 DIAGNOSIS — R531 Weakness: Secondary | ICD-10-CM

## 2024-03-25 DIAGNOSIS — Z8249 Family history of ischemic heart disease and other diseases of the circulatory system: Secondary | ICD-10-CM | POA: Diagnosis not present

## 2024-03-25 DIAGNOSIS — Z7985 Long-term (current) use of injectable non-insulin antidiabetic drugs: Secondary | ICD-10-CM | POA: Diagnosis not present

## 2024-03-25 DIAGNOSIS — Z79899 Other long term (current) drug therapy: Secondary | ICD-10-CM

## 2024-03-25 DIAGNOSIS — S01112A Laceration without foreign body of left eyelid and periocular area, initial encounter: Secondary | ICD-10-CM | POA: Diagnosis present

## 2024-03-25 DIAGNOSIS — I10 Essential (primary) hypertension: Secondary | ICD-10-CM | POA: Diagnosis present

## 2024-03-25 DIAGNOSIS — S0101XA Laceration without foreign body of scalp, initial encounter: Secondary | ICD-10-CM | POA: Diagnosis present

## 2024-03-25 DIAGNOSIS — D62 Acute posthemorrhagic anemia: Secondary | ICD-10-CM | POA: Diagnosis present

## 2024-03-25 DIAGNOSIS — O2650 Maternal hypotension syndrome, unspecified trimester: Secondary | ICD-10-CM

## 2024-03-25 DIAGNOSIS — E0590 Thyrotoxicosis, unspecified without thyrotoxic crisis or storm: Secondary | ICD-10-CM | POA: Diagnosis present

## 2024-03-25 DIAGNOSIS — K449 Diaphragmatic hernia without obstruction or gangrene: Secondary | ICD-10-CM | POA: Diagnosis present

## 2024-03-25 DIAGNOSIS — Z83438 Family history of other disorder of lipoprotein metabolism and other lipidemia: Secondary | ICD-10-CM | POA: Diagnosis not present

## 2024-03-25 DIAGNOSIS — K5791 Diverticulosis of intestine, part unspecified, without perforation or abscess with bleeding: Secondary | ICD-10-CM | POA: Diagnosis not present

## 2024-03-25 DIAGNOSIS — S0181XA Laceration without foreign body of other part of head, initial encounter: Secondary | ICD-10-CM

## 2024-03-25 DIAGNOSIS — K921 Melena: Secondary | ICD-10-CM | POA: Diagnosis present

## 2024-03-25 LAB — CBC WITH DIFFERENTIAL/PLATELET
Abs Immature Granulocytes: 0.06 10*3/uL (ref 0.00–0.07)
Basophils Absolute: 0 10*3/uL (ref 0.0–0.1)
Basophils Relative: 0 %
Eosinophils Absolute: 0.1 10*3/uL (ref 0.0–0.5)
Eosinophils Relative: 1 %
HCT: 31.8 % — ABNORMAL LOW (ref 36.0–46.0)
Hemoglobin: 10.5 g/dL — ABNORMAL LOW (ref 12.0–15.0)
Immature Granulocytes: 0 %
Lymphocytes Relative: 12 %
Lymphs Abs: 1.7 10*3/uL (ref 0.7–4.0)
MCH: 32.5 pg (ref 26.0–34.0)
MCHC: 33 g/dL (ref 30.0–36.0)
MCV: 98.5 fL (ref 80.0–100.0)
Monocytes Absolute: 1.1 10*3/uL — ABNORMAL HIGH (ref 0.1–1.0)
Monocytes Relative: 8 %
Neutro Abs: 10.8 10*3/uL — ABNORMAL HIGH (ref 1.7–7.7)
Neutrophils Relative %: 79 %
Platelets: 228 10*3/uL (ref 150–400)
RBC: 3.23 MIL/uL — ABNORMAL LOW (ref 3.87–5.11)
RDW: 12.5 % (ref 11.5–15.5)
WBC: 13.7 10*3/uL — ABNORMAL HIGH (ref 4.0–10.5)
nRBC: 0 % (ref 0.0–0.2)

## 2024-03-25 LAB — PROCALCITONIN: Procalcitonin: <0.1 ng/mL

## 2024-03-25 LAB — I-STAT CHEM 8, ED
BUN: 25 mg/dL — ABNORMAL HIGH (ref 8–23)
Calcium, Ion: 1.21 mmol/L (ref 1.15–1.40)
Chloride: 105 mmol/L (ref 98–111)
Creatinine, Ser: 0.8 mg/dL (ref 0.44–1.00)
Glucose, Bld: 138 mg/dL — ABNORMAL HIGH (ref 70–99)
HCT: 32 % — ABNORMAL LOW (ref 36.0–46.0)
Hemoglobin: 10.9 g/dL — ABNORMAL LOW (ref 12.0–15.0)
Potassium: 4.4 mmol/L (ref 3.5–5.1)
Sodium: 137 mmol/L (ref 135–145)
TCO2: 22 mmol/L (ref 22–32)

## 2024-03-25 LAB — HEMOGLOBIN AND HEMATOCRIT, BLOOD
HCT: 28 % — ABNORMAL LOW (ref 36.0–46.0)
HCT: 31.6 % — ABNORMAL LOW (ref 36.0–46.0)
HCT: 27.1 % — ABNORMAL LOW (ref 36.0–46.0)
Hemoglobin: 9.3 g/dL — ABNORMAL LOW (ref 12.0–15.0)
Hemoglobin: 10.5 g/dL — ABNORMAL LOW (ref 12.0–15.0)
Hemoglobin: 9.1 g/dL — ABNORMAL LOW (ref 12.0–15.0)

## 2024-03-25 LAB — PREPARE RBC (CROSSMATCH)

## 2024-03-25 LAB — COMPREHENSIVE METABOLIC PANEL WITH GFR
ALT: 18 U/L (ref 0–44)
AST: 24 U/L (ref 15–41)
Albumin: 3.4 g/dL — ABNORMAL LOW (ref 3.5–5.0)
Alkaline Phosphatase: 37 U/L — ABNORMAL LOW (ref 38–126)
Anion gap: 10 (ref 5–15)
BUN: 25 mg/dL — ABNORMAL HIGH (ref 8–23)
CO2: 22 mmol/L (ref 22–32)
Calcium: 9.2 mg/dL (ref 8.9–10.3)
Chloride: 104 mmol/L (ref 98–111)
Creatinine, Ser: 0.82 mg/dL (ref 0.44–1.00)
GFR, Estimated: >60 mL/min (ref >60)
Glucose, Bld: 139 mg/dL — ABNORMAL HIGH (ref 70–99)
Potassium: 4.3 mmol/L (ref 3.5–5.1)
Sodium: 136 mmol/L (ref 135–145)
Total Bilirubin: 0.5 mg/dL (ref 0.0–1.2)
Total Protein: 6 g/dL — ABNORMAL LOW (ref 6.5–8.1)

## 2024-03-25 LAB — PROTIME-INR
INR: 1.1 (ref 0.8–1.2)
Prothrombin Time: 14.7 s (ref 11.4–15.2)

## 2024-03-25 MED ORDER — IOHEXOL 350 MG/ML SOLN
75.0000 mL | Freq: Once | INTRAVENOUS | Status: AC | PRN
  Administered 2024-03-25: 75 mL via INTRAVENOUS

## 2024-03-25 MED ORDER — SODIUM CHLORIDE 0.9 % IV BOLUS
1000.0000 mL | Freq: Once | INTRAVENOUS | Status: AC
  Administered 2024-03-25: 1000 mL via INTRAVENOUS

## 2024-03-25 MED ORDER — GABAPENTIN 300 MG PO CAPS
600.0000 mg | ORAL_CAPSULE | Freq: Every day | ORAL | Status: DC
  Administered 2024-03-25 – 2024-03-31 (×7): 600 mg via ORAL
  Filled 2024-03-25 (×7): qty 2

## 2024-03-25 MED ORDER — VITAMIN C 500 MG PO TABS
500.0000 mg | ORAL_TABLET | Freq: Every day | ORAL | Status: DC
  Administered 2024-03-25 – 2024-03-31 (×7): 500 mg via ORAL
  Filled 2024-03-25 (×7): qty 1

## 2024-03-25 MED ORDER — SODIUM CHLORIDE 0.9% IV SOLUTION: Freq: Once | INTRAVENOUS | Status: DC

## 2024-03-25 MED ORDER — ALPRAZOLAM 0.5 MG PO TABS: 0.2500 mg | ORAL_TABLET | Freq: Two times a day (BID) | ORAL | Status: DC | PRN

## 2024-03-25 MED ORDER — SIMVASTATIN 20 MG PO TABS
40.0000 mg | ORAL_TABLET | Freq: Every day | ORAL | Status: DC
  Administered 2024-03-25 – 2024-03-30 (×6): 40 mg via ORAL
  Filled 2024-03-25 (×7): qty 2

## 2024-03-25 MED ORDER — SODIUM CHLORIDE 0.9% IV SOLUTION: Freq: Once | INTRAVENOUS | Status: AC

## 2024-03-25 MED ORDER — VITAMIN D 25 MCG (1000 UNIT) PO TABS
2000.0000 [IU] | ORAL_TABLET | Freq: Every day | ORAL | Status: DC
  Administered 2024-03-25 – 2024-03-31 (×7): 2000 [IU] via ORAL
  Filled 2024-03-25 (×7): qty 2

## 2024-03-25 NOTE — H&P (Addendum)
 History and Physical    Patient: Vanessa Boyd WGN:[id number] DOB: 1941-02-08 DOA: 03/25/2024 DOS: the patient was seen and examined on 03/25/2024 PCP: Bertha Broad, MD  Patient coming from: Home  Chief Complaint:  Chief Complaint  Patient presents with   Fall   HPI: Vanessa Boyd is a 83 y.o. female with medical history significant of HTN, HLD, hyperthyroidism, osteoporosis p/w BRBPR for a day.   Pt states that she was in her USOH until yesterday at 1800 when she noted her first large bloody BM. She called her PCP, and the both agreed to watch an wait; unfortunately, a few hours later (timing unclear), pt reported having an even larger bloody BM; as such, she got up from the toilet to call EMS, but slipped on blood (unclear if it was on the floor or her leg) and she fell and hit her head causing a laceration above her L eye. Pt does not think she lost consciousness, but can't detail the events of the fall and does endorse hitting her head on the bathroom vanity. She had to call her neighbor, who activated EMS.  In the ED, pt hypotensive. Labs notable for Hb 10.5-->9.3, plt 228, and INR 1.1. CT head NAICA. CT C spine showed no acute fractures, but did show " osseous foraminal narrowing on the left at C2-3 and worse left than right at C3-4, and asymmetric right foraminal narrowing at C4-5." CTA GI was negative for active GIB, but did show extensive diverticulosis. Pt admitted to medicine w/ GI following for ongoing w/u.  Review of Systems: As mentioned in the history of present illness. All other systems reviewed and are negative. Past Medical History:  Diagnosis Date   Abnormal bone density screening    Diverticulitis    Graves' disease    Hyperlipidemia    Hypertension    Hyperthyroidism    Hypoglycemia, unspecified    IBS (irritable bowel syndrome)    Obesity    Osteoporosis    Palpitations    Post-menopausal    Spinal stenosis 2017   Tachycardia    Tobacco use    Past  Surgical History:  Procedure Laterality Date   ABDOMINAL HYSTERECTOMY     APPENDECTOMY     BACK SURGERY  05/2021   COLONOSCOPY WITH PROPOFOL  N/A 07/27/2022   Procedure: COLONOSCOPY WITH PROPOFOL ;  Surgeon: Daina Drum, MD;  Location: [facility] ENDOSCOPY;  Service: Gastroenterology;  Laterality: N/A;   EYE SURGERY     bilateral cataract surgery   OOPHORECTOMY     orif left elbow     SHOULDER OPEN ROTATOR CUFF REPAIR     TONSILLECTOMY     Social History:  reports that she quit smoking about 25 years ago. Her smoking use included cigarettes. She started smoking about 45 years ago. She has never used smokeless tobacco. She reports current alcohol use of about 2.0 standard drinks of alcohol per week. She reports that she does not use drugs.  Allergies  Allergen Reactions   5-Alpha Reductase Inhibitors Other (See Comments)    Unknown reaction   Codeine     REACTION: nausea/vomiting   Hydromorphone  Other (See Comments)    Hallucinations     Family History  Problem Relation Age of Onset   COPD Mother    Hyperlipidemia Mother    Hypertension Sister    Hyperlipidemia Sister    Diabetes Neg Hx    Heart disease Neg Hx    Colon cancer Neg Hx  Rectal cancer Neg Hx    Stomach cancer Neg Hx    Esophageal cancer Neg Hx     Prior to Admission medications   Medication Sig Start Date End Date Taking? Authorizing Provider  ALPRAZolam (XANAX) 0.25 MG tablet Take 0.25 mg by mouth 2 (two) times daily as needed.   Yes [provider]  ascorbic acid (VITAMIN C) 500 MG tablet Take 500 mg by mouth daily.   Yes [provider]  Cholecalciferol (VITAMIN D3) 50 MCG (2000 UT) capsule Take 2,000 Units by mouth daily.    Yes [provider]  denosumab  (PROLIA ) 60 MG/ML SOLN injection Inject 60 mg into the skin every 6 (six) months. Administer in upper arm, thigh, or abdomen   Yes [provider]  gabapentin  (NEURONTIN ) 600 MG tablet Take 600 mg by mouth daily. 03/12/22   Yes [provider]  metFORMIN  (GLUCOPHAGE -XR) 500 MG 24 hr tablet Take 500 mg by mouth daily with breakfast.    Yes [provider]  Multiple Vitamins-Minerals (CENTRUM SILVER PO) Take 1 tablet by mouth daily.     Yes [provider]  omeprazole  (PRILOSEC) 20 MG capsule TAKE 1 CAPSULE(20 MG) BY MOUTH DAILY 02/24/24  Yes Kenney Peacemaker, MD  OZEMPIC, 1 MG/DOSE, 4 MG/3ML SOPN Inject 1 mg into the skin every Wednesday. 01/07/24  Yes [provider]  propranolol (INDERAL) 10 MG tablet Take 10 mg by mouth daily as needed. 02/24/24  Yes [provider]  simvastatin  (ZOCOR ) 40 MG tablet Take 40 mg by mouth daily. 10/06/16  Yes [provider]    Physical Exam: Vitals:   03/25/24 0815 03/25/24 0830 03/25/24 1046 03/25/24 1154  BP: 104/73 (!) 105/52  (!) 109/59  Pulse: 87 76 (!) 101 89  Resp: 20 15 18  (!) 22  Temp:   97.8 F (36.6 C)   SpO2: 98% 99% 98% 100%   General: Alert, oriented x3, resting comfortably in no acute distress HEENT: EOMI, oropharynx clear, moist mucous membranes, hearing intact Neck: Trachea midline and no gross thyromegaly Respiratory: Lungs clear to auscultation bilaterally with normal respiratory effort; no w/r/r Cardiovascular: Regular rate and rhythm w/o m/r/g Abdomen: Soft, nontender, nondistended. Positive bowel sounds MSK: No obvious joint deformities or swelling Skin: No obvious rashes or lesions Neurologic: Awake, alert, spontaneously moves all extremities, strength intact Psychiatric: Appropriate mood and affect, conversational and cooperative  Data Reviewed:  Lab Results  Component Value Date   WBC 13.7 (H) 03/25/2024   HGB 9.3 (L) 03/25/2024   HCT 28.0 (L) 03/25/2024   MCV 98.5 03/25/2024   PLT 228 03/25/2024   Lab Results  Component Value Date   GLUCOSE 138 (H) 03/25/2024   CALCIUM  9.2 03/25/2024   NA 137 03/25/2024   K 4.4 03/25/2024   CO2 22 03/25/2024   CL 105 03/25/2024   BUN 25 (H)  03/25/2024   CREATININE 0.80 03/25/2024   Lab Results  Component Value Date   ALT 18 03/25/2024   AST 24 03/25/2024   ALKPHOS 37 (L) 03/25/2024   BILITOT 0.5 03/25/2024   Lab Results  Component Value Date   INR 1.1 03/25/2024   INR 1.0 07/04/2023    Radiology: CT Cervical Spine Wo Contrast Result Date: 03/25/2024 EXAM: CT CERVICAL SPINE WITHOUT CONTRAST 03/25/2024 07:10:10 AM TECHNIQUE: CT of the cervical was performed without the administration of intravenous contrast. Multiplanar reformatted images are provided for review. Automated exposure control, iterative reconstruction, and/or weight based adjustment of the mA/kV was  utilized to reduce the radiation dose to as low as reasonably achievable. COMPARISON: 10/17/2016 CLINICAL HISTORY: Neck trauma (Age >= 65y). fall FINDINGS: CERVICAL SPINE: BONES/ALIGNMENT: No acute fractures are present. 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 DEGENERATIVE CHANGES: Multilevel endplate degenerative changes demonstrate some progression since the prior study. Osseous foraminal narrowing is present on the left at C2-3 and is worse left than right at C3-4. Asymmetric right foraminal narrowing is present at C4-5. SOFT TISSUES: Atherosclerotic changes are present at the carotid bifurcations bilaterally as well as the cavernous internal carotid arteries. Calcified right thyroid  nodule is stable. A heterogeneous left thyroid  nodule measures 19 mm, also stable. These lesions are not incidental given previous thyroid  ablation, no follow-up is recommended. IMPRESSION: 1. No acute fractures. 2. Multilevel endplate degenerative changes with some progression since the prior study. 3. Osseous foraminal narrowing on the left at C2-3 and worse left than right at C3-4. Asymmetric right foraminal narrowing at C4-5. 4. Congenital fusion at C2-3. Electronically signed by: Audree Leas MD 03/25/2024 07:27 AM EDT RP Workstation: [id number]   CT ANGIO GI BLEED Result Date:  03/25/2024 CLINICAL DATA:  Active GI bleed EXAM: CTA ABDOMEN AND PELVIS WITHOUT AND WITH CONTRAST TECHNIQUE: Multidetector CT imaging of the abdomen and pelvis was performed using the standard protocol during bolus administration of intravenous contrast. Multiplanar reconstructed images and MIPs were obtained and reviewed to evaluate the vascular anatomy. RADIATION DOSE REDUCTION: This exam was performed according to the departmental dose-optimization program which includes automated exposure control, adjustment of the mA and/or kV according to patient size and/or use of iterative reconstruction technique. CONTRAST:  75mL OMNIPAQUE  IOHEXOL  350 MG/ML SOLN COMPARISON:  None Available. FINDINGS: VASCULAR Aorta: Patent with mild atherosclerotic changes. Celiac: Patent. SMA: Patent. Renals: Patent. IMA: Patent. Inflow: Mild atherosclerotic changes, patent. Proximal Outflow: Patent. Veins: No obvious venous abnormality within the limitations of this arterial phase study. Review of the MIP images confirms the above findings. NON-VASCULAR Lower chest: Hiatal hernia. Hepatobiliary: No focal liver lesion. No intrahepatic biliary ductal dilatation. Gallbladder is grossly unremarkable. Pancreas: Unremarkable. No pancreatic ductal dilatation or surrounding inflammatory changes. Spleen: Normal in size without focal abnormality. Adrenals/Urinary Tract: Adrenal glands are unremarkable. Kidneys are normal, without renal calculi, focal lesion, or hydronephrosis. Bladder is unremarkable. Stomach/Bowel: Hiatal hernia. No dilated loops of bowel are appreciated. Extensive diverticular changes are present in the colon. This is worst in the distal in sigmoid colon. The colon in this area is decompressed which limits evaluation for wall thickening. Lymphatic: No significant lymphadenopathy. Reproductive: Status post hysterectomy. No adnexal masses. Other: Nothing significant. Musculoskeletal: Degenerative changes in the imaged osseous  structures. Postsurgical changes from posterior spinal fusion of the lumbar spine. IMPRESSION: 1. No evidence of active arterial extravasation into the GI tract. 2. Extensive colonic diverticulosis is present. There is limited evaluation of the distal colon which may demonstrate mild wall thickening and the thus possibly sequelae of underlying inflammatory change. Correlate with clinical symptoms and consider endoscopic evaluation if clinically appropriate. Electronically Signed   By: Reagan Camera M.D.   On: 03/25/2024 07:26   CT Maxillofacial Wo Contrast Result Date: 03/25/2024 EXAM: CT OF THE FACE WITHOUT CONTRAST 03/25/2024 07:10:10 AM TECHNIQUE: CT of the face was performed without the administration of intravenous contrast. Multiplanar reformatted images are provided for review. Automated exposure control, iterative reconstruction, and/or weight based adjustment of the mA/kV was utilized to reduce the radiation dose to as low as reasonably achievable. COMPARISON: CT head without contrast 10/17/2016. CLINICAL HISTORY: Facial  trauma, blunt. fall FINDINGS: FACIAL BONES: The maxilla, pterygoid plates and zygomatic arches are intact. The mandible is intact. The mandibular condyles are normally situated. The nasal bones and maxillary nasal processes are intact. No underlying fracture is present. ORBITS: Bilateral lens replacements are noted. The globes and orbits are otherwise within normal limits. SINUSES AND MASTOIDS: The paranasal sinuses and mastoid air cells are well aerated. No acute fracture is seen. SOFT TISSUES: A left supraorbital scalp laceration and hematoma are present. No underlying fracture or foreign body is present. No other focal soft tissue injury is present. VASCULATURE: Atherosclerotic calcifications are present in the cavernous carotid arteries bilaterally and at the dural margin of both vertebral arteries. No hyperdense vessel is present. IMPRESSION: 1. Left supraorbital scalp laceration and  hematoma without underlying fracture or foreign body. No other focal soft tissue injury. Electronically signed by: Audree Leas MD 03/25/2024 07:21 AM EDT RP Workstation: [id number]   CT Head Wo Contrast Result Date: 03/25/2024 EXAM: CT HEAD WITHOUT 03/25/2024 07:10:10 AM TECHNIQUE: CT of the head was performed without the administration of intravenous contrast. Automated exposure control, iterative reconstruction, and/or weight based adjustment of the mA/kV was utilized to reduce the radiation dose to as low as reasonably achievable. COMPARISON: None available. CLINICAL HISTORY: Head trauma, minor (Age >= 65y). fall FINDINGS: BRAIN AND VENTRICLES: There is no acute intracranial hemorrhage, mass effect or midline shift. No abnormal extra-axial fluid collection. The gray-white differentiation is maintained without an acute infarct. There is no hydrocephalus. Atherosclerotic calcifications are present in the cavernous carotid arteries bilaterally and at the dural margin of both vertebral arteries. No hyperdense vessel is present. ORBITS: The visualized portion of the orbits demonstrate no acute abnormality. SINUSES: The visualized paranasal sinuses and mastoid air cells demonstrate no acute abnormality. SOFT TISSUES AND SKULL: Left supraorbital scalp laceration and hematoma are present. No underlying fracture or foreign body is present. IMPRESSION: 1. No acute intracranial abnormality. 2. Left supraorbital scalp laceration and hematoma without underlying fracture or foreign body. Electronically signed by: Audree Leas MD 03/25/2024 07:18 AM EDT RP Workstation: [id number]    Assessment and Plan: 23F h/o HTN, HLD, hyperthyroidism, osteoporosis p/w BRBPR for a day.   BRBPR Presumed diverticulosis -GI consulted; recs: no plan for EGD or CSY; consider IR for stat CTA and if positive then to IR for angiography and embolization; serial Hb q6h and transfuse for Hb <7.5; CLD -V PPI BID  HLD -PTA  simvastatin  40mg  daily   Advance Care Planning:   Code Status: Full Code   Consults: GI  Family Communication: N/A  Severity of Illness: The appropriate patient status for this patient is INPATIENT. Inpatient status is judged to be reasonable and necessary in order to provide the required intensity of service to ensure the patient's safety. The patient's presenting symptoms, physical exam findings, and initial radiographic and laboratory data in the context of their chronic comorbidities is felt to place them at high risk for further clinical deterioration. Furthermore, it is not anticipated that the patient will be medically stable for discharge from the hospital within 2 midnights of admission.   * I certify that at the point of admission it is my clinical judgment that the patient will require inpatient hospital care spanning beyond 2 midnights from the point of admission due to high intensity of service, high risk for further deterioration and high frequency of surveillance required.*   ------- I spent 55 minutes reviewing previous labs/notes, obtaining separate history at the bedside, counseling/discussing the treatment  plan outlined above, ordering medications/tests, and performing clinical documentation.  Author: Arne Langdon, MD 03/25/2024 12:05 PM  For on call review [email or web address].

## 2024-03-25 NOTE — ED Notes (Signed)
 Admitting at bedside

## 2024-03-25 NOTE — ED Notes (Signed)
 Patient had 1 large bloody bm

## 2024-03-25 NOTE — ED Notes (Signed)
 Patient BP noted to be low, Dr Luberta Ruse at bedside reports goals of MAP > 60 unless symptomatic. Patient asymptomatic at this time and comfortable.

## 2024-03-25 NOTE — ED Provider Notes (Signed)
 [facility] Provider Note   CSN: [id number] Arrival date & time: 03/25/24  0539     History  Chief Complaint  Patient presents with   Vanessa Boyd is a 83 y.o. female.  83 year old female with history of GI bleed secondary to diverticulosis presents the ER today initially as a fall.  Patient states she started having some bright red blood per rectum around 1800 on Tuesday evening.  She talked her GI doc who recommended ER evaluation however patient thought she would be fine at home.  She had a couple more episodes of GI bleeding and then 1 episode where she was not able to make all of the toilet.  She had blood on the floor she slipped on it fell hitting the front of her head.  She is not sure if she passed out or not but does not think so.  She states that she still has some abdominal bloating type feeling.  No blood thinners, aspirin or Plavix.  Has never had abdominal surgery related to GI bleeding.  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  No fevers or urinary symptoms.   Fall       Home Medications Prior to Admission medications   Medication Sig Start Date End Date Taking? Authorizing Provider  ALPRAZolam (XANAX) 0.25 MG tablet Take 0.25 mg by mouth 2 (two) times daily as needed.   Yes [provider]  ascorbic acid (VITAMIN C) 500 MG tablet Take 500 mg by mouth daily.   Yes [provider]  Cholecalciferol (VITAMIN D3) 50 MCG (2000 UT) capsule Take 2,000 Units by mouth daily.    Yes [provider]  denosumab  (PROLIA ) 60 MG/ML SOLN injection Inject 60 mg into the skin every 6 (six) months. Administer in upper arm, thigh, or abdomen   Yes [provider]  gabapentin  (NEURONTIN ) 600 MG tablet Take 600 mg by mouth daily. 03/12/22  Yes [provider]  metFORMIN  (GLUCOPHAGE -XR) 500 MG 24 hr tablet Take 500 mg by mouth daily with breakfast.    Yes [provider]  Multiple Vitamins-Minerals  (CENTRUM SILVER PO) Take 1 tablet by mouth daily.     Yes [provider]  omeprazole  (PRILOSEC) 20 MG capsule TAKE 1 CAPSULE(20 MG) BY MOUTH DAILY 02/24/24  Yes Kenney Peacemaker, MD  OZEMPIC, 1 MG/DOSE, 4 MG/3ML SOPN Inject 1 mg into the skin every Wednesday. 01/07/24  Yes [provider]  propranolol (INDERAL) 10 MG tablet Take 10 mg by mouth daily as needed. 02/24/24  Yes [provider]  simvastatin  (ZOCOR ) 40 MG tablet Take 40 mg by mouth daily. 10/06/16  Yes [provider]      Allergies    5-alpha reductase inhibitors, Codeine, and Hydromorphone     Review of Systems   Review of Systems  Physical Exam Updated Vital Signs BP (!) 118/59 (BP Location: Right Arm)   Pulse 88   Temp 98.8 F (37.1 C)   Resp 18   SpO2 97%  Physical Exam Vitals and nursing note reviewed.  Constitutional:      Appearance: She is well-developed.  HENT:     Head: Normocephalic.     Comments: Approximately 5 cm laceration over the left eyebrow, does not appear to extend to the skull.  Developing hematoma in the same area    Mouth/Throat:     Mouth: Mucous membranes are dry.  Cardiovascular:     Rate and Rhythm: Normal rate and  regular rhythm.  Pulmonary:     Effort: No respiratory distress.     Breath sounds: No stridor.  Abdominal:     General: There is no distension.  Musculoskeletal:     Cervical back: Normal range of motion.  Skin:    General: Skin is warm and dry.     Coloration: Skin is pale.  Neurological:     Mental Status: She is alert.     ED Results / Procedures / Treatments   Labs (all labs ordered are listed, but only abnormal results are displayed) Labs Reviewed  CBC WITH DIFFERENTIAL/PLATELET - Abnormal; Notable for the following components:      Result Value   WBC 13.7 (*)    RBC 3.23 (*)    Hemoglobin 10.5 (*)    HCT 31.8 (*)    Neutro Abs 10.8 (*)    Monocytes Absolute 1.1 (*)    All other components within normal limits   COMPREHENSIVE METABOLIC PANEL WITH GFR - Abnormal; Notable for the following components:   Glucose, Bld 139 (*)    BUN 25 (*)    Total Protein 6.0 (*)    Albumin 3.4 (*)    Alkaline Phosphatase 37 (*)    All other components within normal limits  HEMOGLOBIN AND HEMATOCRIT, BLOOD - Abnormal; Notable for the following components:   Hemoglobin 9.3 (*)    HCT 28.0 (*)    All other components within normal limits  HEMOGLOBIN AND HEMATOCRIT, BLOOD - Abnormal; Notable for the following components:   Hemoglobin 10.5 (*)    HCT 31.6 (*)    All other components within normal limits  HEMOGLOBIN AND HEMATOCRIT, BLOOD - Abnormal; Notable for the following components:   Hemoglobin 9.1 (*)    HCT 27.1 (*)    All other components within normal limits  I-STAT CHEM 8, ED - Abnormal; Notable for the following components:   BUN 25 (*)    Glucose, Bld 138 (*)    Hemoglobin 10.9 (*)    HCT 32.0 (*)    All other components within normal limits  PROTIME-INR  PROCALCITONIN  CBC  BASIC METABOLIC PANEL WITH GFR  HEMOGLOBIN AND HEMATOCRIT, BLOOD  TYPE AND SCREEN  PREPARE RBC (CROSSMATCH)    EKG None  Radiology CT Cervical Spine Wo Contrast Result Date: 03/25/2024 EXAM: CT CERVICAL SPINE WITHOUT CONTRAST 03/25/2024 07:10:10 AM TECHNIQUE: CT of the cervical was performed without the administration of intravenous contrast. Multiplanar reformatted images are provided for review. Automated exposure control, iterative reconstruction, and/or weight based adjustment of the mA/kV was utilized to reduce the radiation dose to as low as reasonably achievable. COMPARISON: 10/17/2016 CLINICAL HISTORY: Neck trauma (Age >= 65y). fall FINDINGS: CERVICAL SPINE: BONES/ALIGNMENT: No acute fractures are present. 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 DEGENERATIVE CHANGES: Multilevel endplate degenerative changes demonstrate some progression since the prior study. Osseous foraminal narrowing is present on the left at C2-3 and  is worse left than right at C3-4. Asymmetric right foraminal narrowing is present at C4-5. SOFT TISSUES: Atherosclerotic changes are present at the carotid bifurcations bilaterally as well as the cavernous internal carotid arteries. Calcified right thyroid  nodule is stable. A heterogeneous left thyroid  nodule measures 19 mm, also stable. These lesions are not incidental given previous thyroid  ablation, no follow-up is recommended. IMPRESSION: 1. No acute fractures. 2. Multilevel endplate degenerative changes with some progression since the prior study. 3. Osseous foraminal narrowing on the left at C2-3 and worse left than right at C3-4. Asymmetric  right foraminal narrowing at C4-5. 4. Congenital fusion at C2-3. Electronically signed by: Audree Leas MD 03/25/2024 07:27 AM EDT RP Workstation: [id number]   CT ANGIO GI BLEED Result Date: 03/25/2024 CLINICAL DATA:  Active GI bleed EXAM: CTA ABDOMEN AND PELVIS WITHOUT AND WITH CONTRAST TECHNIQUE: Multidetector CT imaging of the abdomen and pelvis was performed using the standard protocol during bolus administration of intravenous contrast. Multiplanar reconstructed images and MIPs were obtained and reviewed to evaluate the vascular anatomy. RADIATION DOSE REDUCTION: This exam was performed according to the departmental dose-optimization program which includes automated exposure control, adjustment of the mA and/or kV according to patient size and/or use of iterative reconstruction technique. CONTRAST:  75mL OMNIPAQUE  IOHEXOL  350 MG/ML SOLN COMPARISON:  None Available. FINDINGS: VASCULAR Aorta: Patent with mild atherosclerotic changes. Celiac: Patent. SMA: Patent. Renals: Patent. IMA: Patent. Inflow: Mild atherosclerotic changes, patent. Proximal Outflow: Patent. Veins: No obvious venous abnormality within the limitations of this arterial phase study. Review of the MIP images confirms the above findings. NON-VASCULAR Lower chest: Hiatal hernia. Hepatobiliary: No  focal liver lesion. No intrahepatic biliary ductal dilatation. Gallbladder is grossly unremarkable. Pancreas: Unremarkable. No pancreatic ductal dilatation or surrounding inflammatory changes. Spleen: Normal in size without focal abnormality. Adrenals/Urinary Tract: Adrenal glands are unremarkable. Kidneys are normal, without renal calculi, focal lesion, or hydronephrosis. Bladder is unremarkable. Stomach/Bowel: Hiatal hernia. No dilated loops of bowel are appreciated. Extensive diverticular changes are present in the colon. This is worst in the distal in sigmoid colon. The colon in this area is decompressed which limits evaluation for wall thickening. Lymphatic: No significant lymphadenopathy. Reproductive: Status post hysterectomy. No adnexal masses. Other: Nothing significant. Musculoskeletal: Degenerative changes in the imaged osseous structures. Postsurgical changes from posterior spinal fusion of the lumbar spine. IMPRESSION: 1. No evidence of active arterial extravasation into the GI tract. 2. Extensive colonic diverticulosis is present. There is limited evaluation of the distal colon which may demonstrate mild wall thickening and the thus possibly sequelae of underlying inflammatory change. Correlate with clinical symptoms and consider endoscopic evaluation if clinically appropriate. Electronically Signed   By: Reagan Camera M.D.   On: 03/25/2024 07:26   CT Maxillofacial Wo Contrast Result Date: 03/25/2024 EXAM: CT OF THE FACE WITHOUT CONTRAST 03/25/2024 07:10:10 AM TECHNIQUE: CT of the face was performed without the administration of intravenous contrast. Multiplanar reformatted images are provided for review. Automated exposure control, iterative reconstruction, and/or weight based adjustment of the mA/kV was utilized to reduce the radiation dose to as low as reasonably achievable. COMPARISON: CT head without contrast 10/17/2016. CLINICAL HISTORY: Facial trauma, blunt. fall FINDINGS: FACIAL BONES: The  maxilla, pterygoid plates and zygomatic arches are intact. The mandible is intact. The mandibular condyles are normally situated. The nasal bones and maxillary nasal processes are intact. No underlying fracture is present. ORBITS: Bilateral lens replacements are noted. The globes and orbits are otherwise within normal limits. SINUSES AND MASTOIDS: The paranasal sinuses and mastoid air cells are well aerated. No acute fracture is seen. SOFT TISSUES: A left supraorbital scalp laceration and hematoma are present. No underlying fracture or foreign body is present. No other focal soft tissue injury is present. VASCULATURE: Atherosclerotic calcifications are present in the cavernous carotid arteries bilaterally and at the dural margin of both vertebral arteries. No hyperdense vessel is present. IMPRESSION: 1. Left supraorbital scalp laceration and hematoma without underlying fracture or foreign body. No other focal soft tissue injury. Electronically signed by: Audree Leas MD 03/25/2024 07:21 AM EDT RP Workstation: [id number]  CT Head Wo Contrast Result Date: 03/25/2024 EXAM: CT HEAD WITHOUT 03/25/2024 07:10:10 AM TECHNIQUE: CT of the head was performed without the administration of intravenous contrast. Automated exposure control, iterative reconstruction, and/or weight based adjustment of the mA/kV was utilized to reduce the radiation dose to as low as reasonably achievable. COMPARISON: None available. CLINICAL HISTORY: Head trauma, minor (Age >= 65y). fall FINDINGS: BRAIN AND VENTRICLES: There is no acute intracranial hemorrhage, mass effect or midline shift. No abnormal extra-axial fluid collection. The gray-white differentiation is maintained without an acute infarct. There is no hydrocephalus. Atherosclerotic calcifications are present in the cavernous carotid arteries bilaterally and at the dural margin of both vertebral arteries. No hyperdense vessel is present. ORBITS: The visualized portion of the  orbits demonstrate no acute abnormality. SINUSES: The visualized paranasal sinuses and mastoid air cells demonstrate no acute abnormality. SOFT TISSUES AND SKULL: Left supraorbital scalp laceration and hematoma are present. No underlying fracture or foreign body is present. IMPRESSION: 1. No acute intracranial abnormality. 2. Left supraorbital scalp laceration and hematoma without underlying fracture or foreign body. Electronically signed by: Audree Leas MD 03/25/2024 07:18 AM EDT RP Workstation: [id number]    Procedures .Laceration Repair  Date/Time: 03/25/2024 11:05 PM  Performed by: Eve Hinders, MD Authorized by: Eve Hinders, MD   Consent:    Consent obtained:  Verbal   Consent given by:  Patient   Risks, benefits, and alternatives were discussed: yes     Risks discussed:  Infection, need for additional repair, nerve damage, poor wound healing, poor cosmetic result, pain and retained foreign body   Alternatives discussed:  No treatment, delayed treatment, observation and referral Universal protocol:    Procedure explained and questions answered to patient or proxy's satisfaction: yes     Immediately prior to procedure, a time out was called: yes     Patient identity confirmed:  Verbally with patient and arm band Laceration details:    Location:  Face   Face location:  Forehead   Length (cm):  5   Depth (mm):  4 Pre-procedure details:    Preparation:  Patient was prepped and draped in usual sterile fashion Exploration:    Wound exploration: wound explored through full range of motion and entire depth of wound visualized   Treatment:    Area cleansed with:  Shur-Clens and soap and water   Amount of cleaning:  Standard   Irrigation solution:  Sterile water   Irrigation method:  Syringe Skin repair:    Repair method:  Sutures and tissue adhesive   Suture size:  5-0   Suture material:  Fast-absorbing gut   Suture technique:  Simple interrupted   Number of sutures:   6 Approximation:    Approximation:  Close Repair type:    Repair type:  Simple Post-procedure details:    Dressing:  Open (no dressing)   Procedure completion:  Tolerated well, no immediate complications Comments:     Patient having continuous rectal bleeding while doing repair so approximated well with sutures and used adhesive to complete repair .Critical Care  Performed by: Eve Hinders, MD Authorized by: Eve Hinders, MD   Critical care provider statement:    Critical care time (minutes):  30   Critical care was necessary to treat or prevent imminent or life-threatening deterioration of the following conditions:  Circulatory failure and shock   Critical care was time spent personally by me on the following activities:  Development of treatment plan with patient or surrogate, discussions with consultants,  evaluation of patient's response to treatment, examination of patient, ordering and review of laboratory studies, ordering and review of radiographic studies, ordering and performing treatments and interventions, pulse oximetry, re-evaluation of patient's condition and review of old charts    Medications Ordered in ED Medications  0.9 %  sodium chloride  infusion (Manually program via Guardrails IV Fluids) ( Intravenous Not Given 03/25/24 0634)  ALPRAZolam (XANAX) tablet 0.25 mg (has no administration in time range)  ascorbic acid (VITAMIN C) tablet 500 mg (500 mg Oral Given 03/25/24 1331)  cholecalciferol (VITAMIN D3) 25 MCG (1000 UNIT) tablet 2,000 Units (2,000 Units Oral Given 03/25/24 1331)  gabapentin  (NEURONTIN ) capsule 600 mg (600 mg Oral Given 03/25/24 1331)  simvastatin  (ZOCOR ) tablet 40 mg (has no administration in time range)  0.9 %  sodium chloride  infusion (Manually program via Guardrails IV Fluids) (0 mLs Intravenous Stopped 03/25/24 0645)  sodium chloride  0.9 % bolus 1,000 mL (0 mLs Intravenous Stopped 03/25/24 1047)  iohexol  (OMNIPAQUE ) 350 MG/ML injection 75 mL (75 mLs  Intravenous Contrast Given 03/25/24 1610)    ED Course/ Medical Decision Making/ A&P                                 Medical Decision Making Amount and/or Complexity of Data Reviewed Labs: ordered. Radiology: ordered.  Risk Prescription drug management. Decision regarding hospitalization.   83 year old female here with GI bleed.  She had 3 maroon mixed with bright red blood stools here within a span of 30 minutes he had vagal response to both of those so was given a unit of blood even though her hemoglobin was not below 7.  Wound as above.  Will get CT scan of the head and neck and angio of her abdomen to see if there is any possible interventional targets.  Consulted gastroenterology per institutional protocol and they will see her while she is admitted.  Care transferred pending results of CT scans and admission.    Final Clinical Impression(s) / ED Diagnoses Final diagnoses:  None    Rx / DC Orders ED Discharge Orders     None         Benedict Kue, Reymundo Caulk, MD 03/25/24 2339

## 2024-03-25 NOTE — Consult Note (Signed)
 Consultation  Referring Provider: ER MD/Belfi Primary Care Physician:  Bertha Broad, MD Primary Gastroenterologist:  Dr. Willy Harvest  Reason for Consultation: Acute GI bleed with fall at home  HPI: Vanessa Boyd is a 83 y.o. female with history of hypertension, hyperlipidemia, hypothyroidism, IBS, and previous history of diverticular hemorrhage for which she was hospitalized in October 2023.  Patient underwent colonoscopy at that time per Dr. Rosaline Coma with finding of multiple small and large sized diverticuli in traded in the sigmoid colon where there was some narrowing, also noted internal hemorrhoids nonbleeding, otherwise negative exam.  Is not on any chronic blood thinners. She called Dr. Willy Harvest who was on-call last evening stating that after she had eaten dinner she had an urge for a bowel movement and then passed a lot of dark red blood in the commode.  At that time she was feeling fine and decision was made to observe for any evidence of further bleeding and if so to come to the ER. Late during the nighttime hours she had several episodes of dark red blood per rectum, had incontinent episode while trying to get to the bathroom and slipped on blood on the floor and hit her head on the counter resulting in a gash over her left eyebrow.  She does not think she syncopized.  She called EMS and then managed to get the floor cleaned up etc.  She has been hemodynamically stable since arrival Initial hemoglobin 6 AM 10.5/hematocrit 31.8/BUN 25/creatinine 0.82 INR 1.1/pro time 14.7.  She has been transfused 1 unit of packed RBCs. Stat CTA was done which shows a hiatal hernia and extensive diverticulosis in the colon primarily in the sigmoid colon but scattered, and no evidence of active extravasation to suggest active bleeding.  Blood pressure again currently stable 102/60 pulse in the 90s Follow-up hemoglobin pending  Has not had any bowel movements since about 7 AM..   Past Medical  History:  Diagnosis Date   Abnormal bone density screening    Diverticulitis    Graves' disease    Hyperlipidemia    Hypertension    Hyperthyroidism    Hypoglycemia, unspecified    IBS (irritable bowel syndrome)    Obesity    Osteoporosis    Palpitations    Post-menopausal    Spinal stenosis 2017   Tachycardia    Tobacco use     Past Surgical History:  Procedure Laterality Date   ABDOMINAL HYSTERECTOMY     APPENDECTOMY     BACK SURGERY  05/2021   COLONOSCOPY WITH PROPOFOL  N/A 07/27/2022   Procedure: COLONOSCOPY WITH PROPOFOL ;  Surgeon: Daina Drum, MD;  Location: [facility] ENDOSCOPY;  Service: Gastroenterology;  Laterality: N/A;   EYE SURGERY     bilateral cataract surgery   OOPHORECTOMY     orif left elbow     SHOULDER OPEN ROTATOR CUFF REPAIR     TONSILLECTOMY      Prior to Admission medications   Medication Sig Start Date End Date Taking? Authorizing Provider  ascorbic acid (VITAMIN C) 500 MG tablet Take 500 mg by mouth daily.    [provider]  Cholecalciferol (VITAMIN D3) 50 MCG (2000 UT) capsule Take 2,000 Units by mouth daily.     [provider]  cyclobenzaprine  (FLEXERIL ) 10 MG tablet Take 1 tablet (10 mg total) by mouth 3 (three) times daily as needed for muscle spasms. 06/11/20   Dawley, Troy C, DO  denosumab  (PROLIA ) 60 MG/ML SOLN injection Inject 60 mg into  the skin every 6 (six) months. Administer in upper arm, thigh, or abdomen    [provider]  gabapentin  (NEURONTIN ) 600 MG tablet Take 600 mg by mouth 2 (two) times daily. 03/12/22   [provider]  metFORMIN  (GLUCOPHAGE -XR) 500 MG 24 hr tablet Take 500 mg by mouth daily with breakfast.     [provider]  Multiple Vitamins-Minerals (CENTRUM SILVER PO) Take 1 tablet by mouth daily.      [provider]  omeprazole  (PRILOSEC) 20 MG capsule TAKE 1 CAPSULE(20 MG) BY MOUTH DAILY 02/24/24   Kenney Peacemaker, MD  simvastatin  (ZOCOR ) 40 MG tablet Take 40 mg by mouth  daily. 10/06/16   [provider]    Current Facility-Administered Medications  Medication Dose Route Frequency Provider Last Rate Last Admin   0.9 %  sodium chloride  infusion (Manually program via Guardrails IV Fluids)   Intravenous Once Mesner, Reymundo Caulk, MD       Current Outpatient Medications  Medication Sig Dispense Refill   ascorbic acid (VITAMIN C) 500 MG tablet Take 500 mg by mouth daily.     Cholecalciferol (VITAMIN D3) 50 MCG (2000 UT) capsule Take 2,000 Units by mouth daily.      cyclobenzaprine  (FLEXERIL ) 10 MG tablet Take 1 tablet (10 mg total) by mouth 3 (three) times daily as needed for muscle spasms. 30 tablet 0   denosumab  (PROLIA ) 60 MG/ML SOLN injection Inject 60 mg into the skin every 6 (six) months. Administer in upper arm, thigh, or abdomen     gabapentin  (NEURONTIN ) 600 MG tablet Take 600 mg by mouth 2 (two) times daily.     metFORMIN  (GLUCOPHAGE -XR) 500 MG 24 hr tablet Take 500 mg by mouth daily with breakfast.      Multiple Vitamins-Minerals (CENTRUM SILVER PO) Take 1 tablet by mouth daily.       omeprazole  (PRILOSEC) 20 MG capsule TAKE 1 CAPSULE(20 MG) BY MOUTH DAILY 90 capsule 3   simvastatin  (ZOCOR ) 40 MG tablet Take 40 mg by mouth daily.  1    Allergies as of 03/25/2024 - Review Complete 03/25/2024  Allergen Reaction Noted   5-alpha reductase inhibitors  01/19/2021   Codeine  12/08/2009   Hydromorphone  Other (See Comments) 02/27/2022    Family History  Problem Relation Age of Onset   COPD Mother    Hyperlipidemia Mother    Hypertension Sister    Hyperlipidemia Sister    Diabetes Neg Hx    Heart disease Neg Hx    Colon cancer Neg Hx    Rectal cancer Neg Hx    Stomach cancer Neg Hx    Esophageal cancer Neg Hx     Social History   Socioeconomic History   Marital status: Widowed    Spouse name: Not on file   Number of children: 3   Years of education: 31   Highest education level: Not on file  Occupational History   Occupation: travel  agent    Comment: retired  Tobacco Use   Smoking status: Former    Current packs/day: 0.00    Types: Cigarettes    Start date: 10/22/1978    Quit date: 10/22/1998    Years since quitting: 25.4   Smokeless tobacco: Never  Vaping Use   Vaping status: Never Used  Substance and Sexual Activity   Alcohol use: Yes    Alcohol/week: 2.0 standard drinks of alcohol    Types: 2 Standard drinks or equivalent per week    Comment: occasionally  Drug use: No   Sexual activity: Not Currently  Other Topics Concern   Not on file  Social History Narrative   Married '62 - widowed '85; 3 sons - '63, '64, '66; 6 grandchildren. HSG. 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 Has a good support network. End- of- Life: OK for CPR, no prolonged ventilator support, no prolong heroic or futile measures.    Social Drivers of Corporate investment banker Strain: Not on file  Food Insecurity: No Food Insecurity (07/25/2022)   Hunger Vital Sign    Worried About Running Out of Food in the Last Year: Never true    Ran Out of Food in the Last Year: Never true  Transportation Needs: No Transportation Needs (07/25/2022)   PRAPARE - Administrator, Civil Service (Medical): No    Lack of Transportation (Non-Medical): No  Physical Activity: Not on file  Stress: Not on file  Social Connections: Not on file  Intimate Partner Violence: Not At Risk (07/25/2022)   Humiliation, Afraid, Rape, and Kick questionnaire    Fear of Current or Ex-Partner: No    Emotionally Abused: No    Physically Abused: No    Sexually Abused: No    Review of Systems: Pertinent positive and negative review of systems were noted in the above HPI section.  All other review of systems was otherwise negative.   Physical Exam: Vital signs in last 24 hours: Temp:  [97.6 F (36.4 C)] 97.6 F (36.4 C) (06/04 0541) Pulse Rate:  [47-104] 76 (06/04 0830) Resp:  [10-21] 15 (06/04 0830) BP:  (57-121)/(41-73) 105/52 (06/04 0830) SpO2:  [98 %-100 %] 99 % (06/04 0830)   General:   Alert,  Well-developed, well-nourished elderly white female, pleasant and cooperative in NAD-large gash above the left eyebrow, sutured Head:  Normocephalic and atraumatic. Eyes:  Sclera clear, no icterus.   Conjunctiva pink. Ears:  Normal auditory acuity. Nose:  No deformity, discharge,  or lesions. Mouth:  No deformity or lesions.   Neck:  Supple; no masses or thyromegaly. Lungs:  Clear throughout to auscultation.   No wheezes, crackles, or rhonchi.  Heart:  Regular rate and rhythm; no murmurs, clicks, rubs,  or gallops. Abdomen:  Soft,nontender, BS active,nonpalp mass or hsm.   Rectal: Not done-frank blood noted per ER Msk:  Symmetrical without gross deformities. . Pulses:  Normal pulses noted. Extremities:  Without clubbing or edema. Neurologic:  Alert and  oriented x4;  grossly normal neurologically. Skin:  Intact without significant lesions or rashes.. Psych:  Alert and cooperative. Normal mood and affect.  Intake/Output from previous day: No intake/output data recorded. Intake/Output this shift: No intake/output data recorded.  Lab Results: Recent Labs    03/25/24 0605 03/25/24 0609  WBC 13.7*  --   HGB 10.5* 10.9*  HCT 31.8* 32.0*  PLT 228  --    BMET Recent Labs    03/25/24 0605 03/25/24 0609  NA 136 137  K 4.3 4.4  CL 104 105  CO2 22  --   GLUCOSE 139* 138*  BUN 25* 25*  CREATININE 0.82 0.80  CALCIUM  9.2  --    LFT Recent Labs    03/25/24 0605  PROT 6.0*  ALBUMIN 3.4*  AST 24  ALT 18  ALKPHOS 37*  BILITOT 0.5   PT/INR Recent Labs    03/25/24 0626  LABPROT 14.7  INR 1.1   Hepatitis Panel No results for input(s): "  HEPBSAG", "HCVAB", "HEPAIGM", "HEPBIGM" in the last 72 hours.   IMPRESSION:  #31 83 year old white female with history of acute diverticular bleed October 2023 for which she was hospitalized and did undergo colonoscopy at that time. Now  with onset of bleeding last evening with urge for a bowel movement and then passage of dark red blood with clots.  No associated abdominal pain, no diaphoresis, no nausea or vomiting In the morning hours she had several more episodes of overt bleeding and slipped on blood on the bathroom floor and hit her head on the countertop but did not syncopized.  Initial hemoglobin 10.9 down from her baseline of about 14  No blood thinners  CTA shows extensive diverticulosis primarily in the sigmoid colon but scattered, no active extravasation to suggest ongoing bleeding  #2 diabetes mellitus #3.  Hyperlipidemia #4.  Osteoarthritis #5.  Hyper thyroidism  PLAN: Clear liquid diet Bedrest today bedside commode with assist Serial hemoglobins every 6 hours and transfuse as indicated to keep her hemoglobin 7.5  Not plan repeat colonoscopy during this admission, if she has further active bleeding with drop in hemoglobin or hemodynamic instability would send back to IR for stat CTA and if positive then to IR for angiography and embolization.  GI will follow with you   Martavion Couper EsterwoodPA-C  03/25/2024, 9:28 AM

## 2024-03-25 NOTE — ED Provider Notes (Signed)
 Care was taken over from Dr. Luberta Ruse.  Patient had presented with large amounts of maroon-colored stools.  Her hemoglobin is okay around 10 but she did have an episode where she became hypotensive and bradycardic in the ED so was given 1 unit of packed red cells.  He did talk with Dr. Willy Harvest who is supposed to be consulting on the patient.  She is not on anticoagulants.  She did have a fall and this resulted in a laceration which has been repaired by Dr. Luberta Ruse.  Her imaging studies do not reveal any evidence of traumatic injuries.  CT angio of the abdomen does not reveal any acute arterial bleeding.  Discussed with Dr. Sulema Endo who will admit the patient.  CRITICAL CARE Performed by: Hershel Los Total critical care time: 40 minutes Critical care time was exclusive of separately billable procedures and treating other patients. Critical care was necessary to treat or prevent imminent or life-threatening deterioration. Critical care was time spent personally by me on the following activities: development of treatment plan with patient and/or surrogate as well as nursing, discussions with consultants, evaluation of patient's response to treatment, examination of patient, obtaining history from patient or surrogate, ordering and performing treatments and interventions, ordering and review of laboratory studies, ordering and review of radiographic studies, pulse oximetry and re-evaluation of patient's condition.    Hershel Los, MD 03/25/24 [phone]

## 2024-03-25 NOTE — ED Triage Notes (Signed)
 Pt bibgcems from independent living, pt been having rectal bleeding and had fall in bathroom. Pt reports loc, no blood thinners. Avulsion over left eyebrow bleeding controlled.  Bp 120/58 Hr 132 99% RA  Cbg 230

## 2024-03-25 NOTE — Telephone Encounter (Signed)
 Patient had recurrent bleeding and went to the ED and will be admitted.

## 2024-03-25 NOTE — Plan of Care (Signed)
  Problem: Pain Managment: Goal: General experience of comfort will improve and/or be controlled Outcome: Progressing   Problem: Safety: Goal: Ability to remain free from injury will improve Outcome: Progressing   Problem: Skin Integrity: Goal: Risk for impaired skin integrity will decrease Outcome: Progressing

## 2024-03-26 DIAGNOSIS — K922 Gastrointestinal hemorrhage, unspecified: Secondary | ICD-10-CM | POA: Diagnosis not present

## 2024-03-26 DIAGNOSIS — D62 Acute posthemorrhagic anemia: Secondary | ICD-10-CM | POA: Diagnosis not present

## 2024-03-26 LAB — CBC
HCT: 24.2 % — ABNORMAL LOW (ref 36.0–46.0)
Hemoglobin: 7.9 g/dL — ABNORMAL LOW (ref 12.0–15.0)
MCH: 31.5 pg (ref 26.0–34.0)
MCHC: 32.6 g/dL (ref 30.0–36.0)
MCV: 96.4 fL (ref 80.0–100.0)
Platelets: 167 10*3/uL (ref 150–400)
RBC: 2.51 MIL/uL — ABNORMAL LOW (ref 3.87–5.11)
RDW: 15.4 % (ref 11.5–15.5)
WBC: 6.9 10*3/uL (ref 4.0–10.5)
nRBC: 0 % (ref 0.0–0.2)

## 2024-03-26 LAB — HEMOGLOBIN AND HEMATOCRIT, BLOOD
HCT: 24.1 % — ABNORMAL LOW (ref 36.0–46.0)
Hemoglobin: 7.8 g/dL — ABNORMAL LOW (ref 12.0–15.0)

## 2024-03-26 LAB — BASIC METABOLIC PANEL WITH GFR
Anion gap: 6 (ref 5–15)
BUN: 14 mg/dL (ref 8–23)
CO2: 24 mmol/L (ref 22–32)
Calcium: 8.1 mg/dL — ABNORMAL LOW (ref 8.9–10.3)
Chloride: 108 mmol/L (ref 98–111)
Creatinine, Ser: 0.71 mg/dL (ref 0.44–1.00)
GFR, Estimated: >60 mL/min (ref >60)
Glucose, Bld: 115 mg/dL — ABNORMAL HIGH (ref 70–99)
Potassium: 4.1 mmol/L (ref 3.5–5.1)
Sodium: 138 mmol/L (ref 135–145)

## 2024-03-26 NOTE — Plan of Care (Signed)

## 2024-03-26 NOTE — TOC CM/SW Note (Signed)
 Transition of Care [facility]) - Inpatient Brief Assessment   Patient Details  Name: Vanessa Boyd MRN: [id number] Date of Birth: [birth year]  Transition of Care [facility]) CM/SW Contact:    Tom-Johnson, Ruvim Risko Daphne, RN Phone Number: 03/26/2024, 4:08 PM   Clinical Narrative:  Patient presented to the ED from [facility] with Rectal Bleeding and had a mechanical Fall in her bathroom.  Noted to have Laceration to her Lt Eyebrow. Patient not on Blood thinners. Admitted with Symptomatic Anemia. Hgb 10.9 on admit,  and hgb today at 7.9. Plan to transfuse for hgb> 7.5. GI following.   Patient is from Independent living Facility, has three supportive children. Independent with her care and able to drive self. Has a cane at home.  PCP is Bertha Broad, MD and uses [facility] on [address]   Patient not Medically ready for discharge.  CM will continue to follow as patient progresses with care towards discharge.            Transition of Care Asessment: Insurance and Status: Insurance coverage has been reviewed Patient has primary care physician: Yes Home environment has been reviewed: Yes Prior level of function:: Modified Independent Prior/Current Home Services: No current home services Social Drivers of Health Review: SDOH reviewed no interventions necessary Readmission risk has been reviewed: Yes Transition of care needs: no transition of care needs at this time

## 2024-03-26 NOTE — Progress Notes (Signed)
 Patient ID: Vanessa Boyd, female   DOB: [birth year], 83 y.o.   MRN: [id number] .   Progress Note   Subjective   Day # 2 CC;acute lower GI bleed with fall at home  Labs today-WBC 6.9/hemoglobin 7.9/hematocrit 24.2  Patient in good spirits, no complaints of abdominal discomfort or cramping no nausea or vomiting, had not had any bowel movements all day yesterday or last night, 1 small bowel movement this morning "less bloody" per patient.  She says she tried to get up and walk in the hall earlier this morning and realized she is feeling pretty weak, denies dizziness     Objective   Vital signs in last 24 hours: Temp:  [97.8 F (36.6 C)-98.8 F (37.1 C)] 98.2 F (36.8 C) (06/05 0800) Pulse Rate:  [70-101] 95 (06/05 0800) Resp:  [16-22] 18 (06/05 0800) BP: (109-135)/(50-93) 135/52 (06/05 0800) SpO2:  [97 %-100 %] 100 % (06/05 0800) Weight:  [58 kg] 58 kg (06/05 0608) Last BM Date : 03/26/24 General: Elderly white female in NAD Heart:  Regular rate and rhythm; no murmurs Lungs: Respirations even and unlabored, lungs CTA bilaterally Abdomen:  Soft, nontender and nondistended. Normal bowel sounds. Extremities:  Without edema. Neurologic:  Alert and oriented,  grossly normal neurologically. Psych:  Cooperative. Normal mood and affect.  Intake/Output from previous day: No intake/output data recorded. Intake/Output this shift: No intake/output data recorded.  Lab Results: Recent Labs    03/25/24 0605 03/25/24 0609 03/25/24 1543 03/25/24 2115 03/26/24 0458  WBC 13.7*  --   --   --  6.9  HGB 10.5*   < > 10.5* 9.1* 7.9*  HCT 31.8*   < > 31.6* 27.1* 24.2*  PLT 228  --   --   --  167   < > = values in this interval not displayed.   BMET Recent Labs    03/25/24 0605 03/25/24 0609 03/26/24 0458  NA 136 137 138  K 4.3 4.4 4.1  CL 104 105 108  CO2 22  --  24  GLUCOSE 139* 138* 115*  BUN 25* 25* 14  CREATININE 0.82 0.80 0.71  CALCIUM  9.2  --  8.1*   LFT Recent Labs     03/25/24 0605  PROT 6.0*  ALBUMIN 3.4*  AST 24  ALT 18  ALKPHOS 37*  BILITOT 0.5   PT/INR Recent Labs    03/25/24 0626  LABPROT 14.7  INR 1.1    Studies/Results: CT Cervical Spine Wo Contrast Result Date: 03/25/2024 EXAM: CT CERVICAL SPINE WITHOUT CONTRAST 03/25/2024 07:10:10 AM TECHNIQUE: CT of the cervical was performed without the administration of intravenous contrast. Multiplanar reformatted images are provided for review. Automated exposure control, iterative reconstruction, and/or weight based adjustment of the mA/kV was utilized to reduce the radiation dose to as low as reasonably achievable. COMPARISON: 10/17/2016 CLINICAL HISTORY: Neck trauma (Age >= 65y). fall FINDINGS: CERVICAL SPINE: BONES/ALIGNMENT: No acute fractures are present. 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 DEGENERATIVE CHANGES: Multilevel endplate degenerative changes demonstrate some progression since the prior study. Osseous foraminal narrowing is present on the left at C2-3 and is worse left than right at C3-4. Asymmetric right foraminal narrowing is present at C4-5. SOFT TISSUES: Atherosclerotic changes are present at the carotid bifurcations bilaterally as well as the cavernous internal carotid arteries. Calcified right thyroid  nodule is stable. A heterogeneous left thyroid  nodule measures 19 mm, also stable. These lesions are not incidental given previous thyroid  ablation, no follow-up is recommended. IMPRESSION: 1. No  acute fractures. 2. Multilevel endplate degenerative changes with some progression since the prior study. 3. Osseous foraminal narrowing on the left at C2-3 and worse left than right at C3-4. Asymmetric right foraminal narrowing at C4-5. 4. Congenital fusion at C2-3. Electronically signed by: Audree Leas MD 03/25/2024 07:27 AM EDT RP Workstation: [id number]   CT ANGIO GI BLEED Result Date: 03/25/2024 CLINICAL DATA:  Active GI bleed EXAM: CTA ABDOMEN AND PELVIS WITHOUT AND WITH CONTRAST  TECHNIQUE: Multidetector CT imaging of the abdomen and pelvis was performed using the standard protocol during bolus administration of intravenous contrast. Multiplanar reconstructed images and MIPs were obtained and reviewed to evaluate the vascular anatomy. RADIATION DOSE REDUCTION: This exam was performed according to the departmental dose-optimization program which includes automated exposure control, adjustment of the mA and/or kV according to patient size and/or use of iterative reconstruction technique. CONTRAST:  75mL OMNIPAQUE  IOHEXOL  350 MG/ML SOLN COMPARISON:  None Available. FINDINGS: VASCULAR Aorta: Patent with mild atherosclerotic changes. Celiac: Patent. SMA: Patent. Renals: Patent. IMA: Patent. Inflow: Mild atherosclerotic changes, patent. Proximal Outflow: Patent. Veins: No obvious venous abnormality within the limitations of this arterial phase study. Review of the MIP images confirms the above findings. NON-VASCULAR Lower chest: Hiatal hernia. Hepatobiliary: No focal liver lesion. No intrahepatic biliary ductal dilatation. Gallbladder is grossly unremarkable. Pancreas: Unremarkable. No pancreatic ductal dilatation or surrounding inflammatory changes. Spleen: Normal in size without focal abnormality. Adrenals/Urinary Tract: Adrenal glands are unremarkable. Kidneys are normal, without renal calculi, focal lesion, or hydronephrosis. Bladder is unremarkable. Stomach/Bowel: Hiatal hernia. No dilated loops of bowel are appreciated. Extensive diverticular changes are present in the colon. This is worst in the distal in sigmoid colon. The colon in this area is decompressed which limits evaluation for wall thickening. Lymphatic: No significant lymphadenopathy. Reproductive: Status post hysterectomy. No adnexal masses. Other: Nothing significant. Musculoskeletal: Degenerative changes in the imaged osseous structures. Postsurgical changes from posterior spinal fusion of the lumbar spine. IMPRESSION: 1. No  evidence of active arterial extravasation into the GI tract. 2. Extensive colonic diverticulosis is present. There is limited evaluation of the distal colon which may demonstrate mild wall thickening and the thus possibly sequelae of underlying inflammatory change. Correlate with clinical symptoms and consider endoscopic evaluation if clinically appropriate. Electronically Signed   By: Reagan Camera M.D.   On: 03/25/2024 07:26   CT Maxillofacial Wo Contrast Result Date: 03/25/2024 EXAM: CT OF THE FACE WITHOUT CONTRAST 03/25/2024 07:10:10 AM TECHNIQUE: CT of the face was performed without the administration of intravenous contrast. Multiplanar reformatted images are provided for review. Automated exposure control, iterative reconstruction, and/or weight based adjustment of the mA/kV was utilized to reduce the radiation dose to as low as reasonably achievable. COMPARISON: CT head without contrast 10/17/2016. CLINICAL HISTORY: Facial trauma, blunt. fall FINDINGS: FACIAL BONES: The maxilla, pterygoid plates and zygomatic arches are intact. The mandible is intact. The mandibular condyles are normally situated. The nasal bones and maxillary nasal processes are intact. No underlying fracture is present. ORBITS: Bilateral lens replacements are noted. The globes and orbits are otherwise within normal limits. SINUSES AND MASTOIDS: The paranasal sinuses and mastoid air cells are well aerated. No acute fracture is seen. SOFT TISSUES: A left supraorbital scalp laceration and hematoma are present. No underlying fracture or foreign body is present. No other focal soft tissue injury is present. VASCULATURE: Atherosclerotic calcifications are present in the cavernous carotid arteries bilaterally and at the dural margin of both vertebral arteries. No hyperdense vessel is present. IMPRESSION: 1. Left  supraorbital scalp laceration and hematoma without underlying fracture or foreign body. No other focal soft tissue injury.  Electronically signed by: Audree Leas MD 03/25/2024 07:21 AM EDT RP Workstation: [id number]   CT Head Wo Contrast Result Date: 03/25/2024 EXAM: CT HEAD WITHOUT 03/25/2024 07:10:10 AM TECHNIQUE: CT of the head was performed without the administration of intravenous contrast. Automated exposure control, iterative reconstruction, and/or weight based adjustment of the mA/kV was utilized to reduce the radiation dose to as low as reasonably achievable. COMPARISON: None available. CLINICAL HISTORY: Head trauma, minor (Age >= 65y). fall FINDINGS: BRAIN AND VENTRICLES: There is no acute intracranial hemorrhage, mass effect or midline shift. No abnormal extra-axial fluid collection. The gray-white differentiation is maintained without an acute infarct. There is no hydrocephalus. Atherosclerotic calcifications are present in the cavernous carotid arteries bilaterally and at the dural margin of both vertebral arteries. No hyperdense vessel is present. ORBITS: The visualized portion of the orbits demonstrate no acute abnormality. SINUSES: The visualized paranasal sinuses and mastoid air cells demonstrate no acute abnormality. SOFT TISSUES AND SKULL: Left supraorbital scalp laceration and hematoma are present. No underlying fracture or foreign body is present. IMPRESSION: 1. No acute intracranial abnormality. 2. Left supraorbital scalp laceration and hematoma without underlying fracture or foreign body. 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  She had multiple bowel movements at home, slipped on blood on the bathroom floor and hit her head on the counter sustaining a large gash over her left eyebrow.  CTA done  through the emergency room yesterday negative for extravasation, positive for extensive diverticulosis primarily in the sigmoid colon but scattered.  Hemoglobin has drifted down to 7.9 suspect that has been equilibration as she had not had any active bleeding all day yesterday.  1 small less bloody appearing bowel movement this morning  #2 anemia acute secondary to acute blood loss 3 diabetes mellitus 4.  Hyperlipidemia 5.  Hyperthyroidism  Plan; as no plans for colonoscopy this admission since she had this done October 23 with similar presentation we will go ahead with soft diet today Continue  to trend hemoglobin every 8 hours and transfuse for hemoglobin less than 7.5 GI will continue to follow with you    Principal Problem:   GIB (gastrointestinal bleeding) Active Problems:   Lower GI bleed   ABLA (acute blood loss anemia)     LOS: 1 day   Lorie Cleckley EsterwoodPA-C  03/26/2024, 9:09 AM

## 2024-03-26 NOTE — Progress Notes (Signed)
 [facility] PROGRESS NOTE   Vanessa Boyd ZOX:[id number] DOB: 1940/12/03 DOA: 03/25/2024  PCP: Bertha Broad, MD  Brief History:  83 y.o. female with medical history significant of HTN, HLD, hyperthyroidism, osteoporosis p/w BRBPR for a day.  She was hospitalized for further management.  She also experienced a mechanical fall at home hitting her head against the floor which resulted in a laceration.  This was sutured in the emergency department.  Consultants: Gastroenterology  Procedures: Suture of scalp laceration    Subjective/Interval History: Patient denies any abdominal pain nausea or vomiting.  Had a small amount of bleeding per rectum early this morning.  Denies any dizziness or lightheadedness.  Mild pain over the laceration site to the left forehead.    Assessment/Plan:  Hematochezia/acute blood loss anemia Most likely diverticular bleed.  Underwent CT angiogram which did not show any active bleed. Did experience significant drop in hemoglobin.  It appears that she was transfused 1 unit of PRBC in the emergency department.  Hemoglobin noted to be 7.9.  Will recheck later today. Not on any anticoagulation. Gastroenterology is following.  No plans for procedure at this time.  Essential hypertension Patient was noted to be hypotensive initially in the emergency department.  Blood pressure has improved.  Propranolol is currently on hold.   DVT Prophylaxis: SCDs Code Status: Full code Family Communication: Discussed with patient Disposition Plan: Hopefully return home when improved  Status is: Inpatient Remains inpatient appropriate because: GI bleed      Medications: Scheduled:  sodium chloride    Intravenous Once   ascorbic acid  500 mg Oral Daily   cholecalciferol  2,000 Units Oral Daily   gabapentin   600 mg Oral Daily   simvastatin   40 mg Oral QHS   Continuous: [id number]  Antibiotics: Anti-infectives (From admission, onward)    None        Objective:  Vital Signs  Vitals:   03/25/24 1454 03/25/24 2125 03/26/24 0608 03/26/24 0800  BP: (!) 115/93 (!) 118/59 (!) 110/57 (!) 135/52  Pulse: 79 88 70 95  Resp: 16 18 18 18   Temp: 98 F (36.7 C) 98.8 F (37.1 C) 98.3 F (36.8 C) 98.2 F (36.8 C)  TempSrc: Oral Oral  Oral  SpO2: 100% 97% 98% 100%  Weight:   58 kg   Height:   4\' 11"  (1.499 m)    No intake or output data in the 24 hours ending 03/26/24 1015 Filed Weights   03/26/24 0608  Weight: 58 kg    General appearance: Awake alert.  In no distress Resp: Clear to auscultation bilaterally.  Normal effort Cardio: S1-S2 is normal regular.  No S3-S4.  No rubs murmurs or bruit GI: Abdomen is soft.  Nontender nondistended.  Bowel sounds are present normal.  No masses organomegaly Extremities: No edema.  Full range of motion of lower extremities. Neurologic: Alert and oriented x3.  No focal neurological deficits.    Lab Results:  Data Reviewed: I have personally reviewed following labs and reports of the imaging studies  CBC: Recent Labs  Lab 03/25/24 0605 03/25/24 0609 03/25/24 0939 03/25/24 1543 03/25/24 2115 03/26/24 0458  WBC 13.7*  --   --   --   --  6.9  NEUTROABS 10.8*  --   --   --   --   --   HGB 10.5* 10.9* 9.3* 10.5* 9.1* 7.9*  HCT 31.8* 32.0* 28.0* 31.6* 27.1* 24.2*  MCV 98.5  --   --   --   --  96.4  PLT 228  --   --   --   --  167    Basic Metabolic Panel: Recent Labs  Lab 03/25/24 0605 03/25/24 0609 03/26/24 0458  NA 136 137 138  K 4.3 4.4 4.1  CL 104 105 108  CO2 22  --  24  GLUCOSE 139* 138* 115*  BUN 25* 25* 14  CREATININE 0.82 0.80 0.71  CALCIUM  9.2  --  8.1*    GFR: Estimated Creatinine Clearance: 42.7 mL/min (by C-G formula based on SCr of 0.71 mg/dL).  Liver Function Tests: Recent Labs  Lab 03/25/24 0605  AST 24  ALT 18  ALKPHOS 37*  BILITOT 0.5  PROT 6.0*  ALBUMIN 3.4*    Coagulation Profile: Recent Labs  Lab 03/25/24 0626  INR 1.1      Radiology Studies: CT Cervical Spine Wo Contrast Result Date: 03/25/2024 EXAM: CT CERVICAL SPINE WITHOUT CONTRAST 03/25/2024 07:10:10 AM TECHNIQUE: CT of the cervical was performed without the administration of intravenous contrast. Multiplanar reformatted images are provided for review. Automated exposure control, iterative reconstruction, and/or weight based adjustment of the mA/kV was utilized to reduce the radiation dose to as low as reasonably achievable. COMPARISON: 10/17/2016 CLINICAL HISTORY: Neck trauma (Age >= 65y). fall FINDINGS: CERVICAL SPINE: BONES/ALIGNMENT: No acute fractures are present. 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 DEGENERATIVE CHANGES: Multilevel endplate degenerative changes demonstrate some progression since the prior study. Osseous foraminal narrowing is present on the left at C2-3 and is worse left than right at C3-4. Asymmetric right foraminal narrowing is present at C4-5. SOFT TISSUES: Atherosclerotic changes are present at the carotid bifurcations bilaterally as well as the cavernous internal carotid arteries. Calcified right thyroid  nodule is stable. A heterogeneous left thyroid  nodule measures 19 mm, also stable. These lesions are not incidental given previous thyroid  ablation, no follow-up is recommended. IMPRESSION: 1. No acute fractures. 2. Multilevel endplate degenerative changes with some progression since the prior study. 3. Osseous foraminal narrowing on the left at C2-3 and worse left than right at C3-4. Asymmetric right foraminal narrowing at C4-5. 4. Congenital fusion at C2-3. Electronically signed by: Audree Leas MD 03/25/2024 07:27 AM EDT RP Workstation: [id number]   CT ANGIO GI BLEED Result Date: 03/25/2024 CLINICAL DATA:  Active GI bleed EXAM: CTA ABDOMEN AND PELVIS WITHOUT AND WITH CONTRAST TECHNIQUE: Multidetector CT imaging of the abdomen and pelvis was performed using the standard protocol during bolus administration of intravenous contrast.  Multiplanar reconstructed images and MIPs were obtained and reviewed to evaluate the vascular anatomy. RADIATION DOSE REDUCTION: This exam was performed according to the departmental dose-optimization program which includes automated exposure control, adjustment of the mA and/or kV according to patient size and/or use of iterative reconstruction technique. CONTRAST:  75mL OMNIPAQUE  IOHEXOL  350 MG/ML SOLN COMPARISON:  None Available. FINDINGS: VASCULAR Aorta: Patent with mild atherosclerotic changes. Celiac: Patent. SMA: Patent. Renals: Patent. IMA: Patent. Inflow: Mild atherosclerotic changes, patent. Proximal Outflow: Patent. Veins: No obvious venous abnormality within the limitations of this arterial phase study. Review of the MIP images confirms the above findings. NON-VASCULAR Lower chest: Hiatal hernia. Hepatobiliary: No focal liver lesion. No intrahepatic biliary ductal dilatation. Gallbladder is grossly unremarkable. Pancreas: Unremarkable. No pancreatic ductal dilatation or surrounding inflammatory changes. Spleen: Normal in size without focal abnormality. Adrenals/Urinary Tract: Adrenal glands are unremarkable. Kidneys are normal, without renal calculi, focal lesion, or hydronephrosis. Bladder is unremarkable. Stomach/Bowel: Hiatal hernia. No dilated loops of bowel are appreciated. Extensive diverticular changes are present in the colon.  This is worst in the distal in sigmoid colon. The colon in this area is decompressed which limits evaluation for wall thickening. Lymphatic: No significant lymphadenopathy. Reproductive: Status post hysterectomy. No adnexal masses. Other: Nothing significant. Musculoskeletal: Degenerative changes in the imaged osseous structures. Postsurgical changes from posterior spinal fusion of the lumbar spine. IMPRESSION: 1. No evidence of active arterial extravasation into the GI tract. 2. Extensive colonic diverticulosis is present. There is limited evaluation of the distal colon  which may demonstrate mild wall thickening and the thus possibly sequelae of underlying inflammatory change. Correlate with clinical symptoms and consider endoscopic evaluation if clinically appropriate. Electronically Signed   By: Reagan Camera M.D.   On: 03/25/2024 07:26   CT Maxillofacial Wo Contrast Result Date: 03/25/2024 EXAM: CT OF THE FACE WITHOUT CONTRAST 03/25/2024 07:10:10 AM TECHNIQUE: CT of the face was performed without the administration of intravenous contrast. Multiplanar reformatted images are provided for review. Automated exposure control, iterative reconstruction, and/or weight based adjustment of the mA/kV was utilized to reduce the radiation dose to as low as reasonably achievable. COMPARISON: CT head without contrast 10/17/2016. CLINICAL HISTORY: Facial trauma, blunt. fall FINDINGS: FACIAL BONES: The maxilla, pterygoid plates and zygomatic arches are intact. The mandible is intact. The mandibular condyles are normally situated. The nasal bones and maxillary nasal processes are intact. No underlying fracture is present. ORBITS: Bilateral lens replacements are noted. The globes and orbits are otherwise within normal limits. SINUSES AND MASTOIDS: The paranasal sinuses and mastoid air cells are well aerated. No acute fracture is seen. SOFT TISSUES: A left supraorbital scalp laceration and hematoma are present. No underlying fracture or foreign body is present. No other focal soft tissue injury is present. VASCULATURE: Atherosclerotic calcifications are present in the cavernous carotid arteries bilaterally and at the dural margin of both vertebral arteries. No hyperdense vessel is present. IMPRESSION: 1. Left supraorbital scalp laceration and hematoma without underlying fracture or foreign body. No other focal soft tissue injury. Electronically signed by: Audree Leas MD 03/25/2024 07:21 AM EDT RP Workstation: [id number]   CT Head Wo Contrast Result Date: 03/25/2024 EXAM: CT HEAD WITHOUT  03/25/2024 07:10:10 AM TECHNIQUE: CT of the head was performed without the administration of intravenous contrast. Automated exposure control, iterative reconstruction, and/or weight based adjustment of the mA/kV was utilized to reduce the radiation dose to as low as reasonably achievable. COMPARISON: None available. CLINICAL HISTORY: Head trauma, minor (Age >= 65y). fall FINDINGS: BRAIN AND VENTRICLES: There is no acute intracranial hemorrhage, mass effect or midline shift. No abnormal extra-axial fluid collection. The gray-white differentiation is maintained without an acute infarct. There is no hydrocephalus. Atherosclerotic calcifications are present in the cavernous carotid arteries bilaterally and at the dural margin of both vertebral arteries. No hyperdense vessel is present. ORBITS: The visualized portion of the orbits demonstrate no acute abnormality. SINUSES: The visualized paranasal sinuses and mastoid air cells demonstrate no acute abnormality. SOFT TISSUES AND SKULL: Left supraorbital scalp laceration and hematoma are present. No underlying fracture or foreign body is present. IMPRESSION: 1. No acute intracranial abnormality. 2. Left supraorbital scalp laceration and hematoma without underlying fracture or foreign body. Electronically signed by: Audree Leas MD 03/25/2024 07:18 AM EDT RP Workstation: [id number]       LOS: 1 day   Maylene Spear  [facility] Pager on [email or web address]  03/26/2024, 10:15 AM

## 2024-03-27 DIAGNOSIS — K5791 Diverticulosis of intestine, part unspecified, without perforation or abscess with bleeding: Secondary | ICD-10-CM | POA: Diagnosis not present

## 2024-03-27 DIAGNOSIS — D500 Iron deficiency anemia secondary to blood loss (chronic): Secondary | ICD-10-CM

## 2024-03-27 DIAGNOSIS — K5790 Diverticulosis of intestine, part unspecified, without perforation or abscess without bleeding: Secondary | ICD-10-CM

## 2024-03-27 LAB — BASIC METABOLIC PANEL WITH GFR
Anion gap: 6 (ref 5–15)
BUN: 11 mg/dL (ref 8–23)
CO2: 23 mmol/L (ref 22–32)
Calcium: 8.2 mg/dL — ABNORMAL LOW (ref 8.9–10.3)
Chloride: 109 mmol/L (ref 98–111)
Creatinine, Ser: 0.78 mg/dL (ref 0.44–1.00)
GFR, Estimated: >60 mL/min (ref >60)
Glucose, Bld: 116 mg/dL — ABNORMAL HIGH (ref 70–99)
Potassium: 4.3 mmol/L (ref 3.5–5.1)
Sodium: 138 mmol/L (ref 135–145)

## 2024-03-27 LAB — CBC
HCT: 21.1 % — ABNORMAL LOW (ref 36.0–46.0)
Hemoglobin: 7 g/dL — ABNORMAL LOW (ref 12.0–15.0)
MCH: 32.1 pg (ref 26.0–34.0)
MCHC: 33.2 g/dL (ref 30.0–36.0)
MCV: 96.8 fL (ref 80.0–100.0)
Platelets: 170 10*3/uL (ref 150–400)
RBC: 2.18 MIL/uL — ABNORMAL LOW (ref 3.87–5.11)
RDW: 14.7 % (ref 11.5–15.5)
WBC: 8.2 10*3/uL (ref 4.0–10.5)
nRBC: 0 % (ref 0.0–0.2)

## 2024-03-27 LAB — PREPARE RBC (CROSSMATCH)

## 2024-03-27 MED ORDER — ACETAMINOPHEN 325 MG PO TABS
650.0000 mg | ORAL_TABLET | Freq: Four times a day (QID) | ORAL | Status: DC | PRN
  Administered 2024-03-27: 650 mg via ORAL
  Filled 2024-03-27: qty 2

## 2024-03-27 MED ORDER — SODIUM CHLORIDE 0.9% IV SOLUTION: Freq: Once | INTRAVENOUS | Status: AC

## 2024-03-27 NOTE — Progress Notes (Signed)
 Nutrition Education Note  RD consulted for nutrition education regarding Nutrition Management for Diverticulosis/Diverticulitis.  24 Hour Recall B: sometimes skips OR egg w/ fresh fruit and coffee L: protein, starch, vegetable D: protein, starch, vegetable   Recently moved into independent living at [facility] approximately 3 weeks ago. Meals provided there are more balanced than her previous diet when living alone. Although, her reported intake during that time was not inappropriate. She even requested this diet education.   RD provided both "Low Fiber Nutrition Therapy" and "High Fiber Nutrition Therapy" handout from the Academy of [employer]. Reviewed home diet with pt and suggested ways to meet nutrition goals over the next several weeks. Explained reasons to follow Low Fiber diet for the next 4-6 weeks and discussed ways to achieve. Discussed best practice for long-term management of diverticulosis is a High Fiber diet and discussed ways to increase fiber content in an appropriate time frame. Encouraged fresh fruits and vegetables as well as whole grain sources of carbohydrates to maximize fiber intake. Encouraged fluid intake. Discussed symptom management should abdominal pain occur while on high fiber diet.  Pt verbalizes understanding of information provided.   Expect good compliance.  Body mass index is 25.8kg/m^2 Pt meets criteria for within normative standards for age based on current BMI.  Current diet order is GI soft, patient is consuming approximately 100% of meals at this time. Labs and medications reviewed. No further nutrition interventions warranted at this time. RD contact information provided. If additional nutrition issues arise, please re-consult Rd.  Con Decant MS, RD, LDN Registered Dietitian Clinical Nutrition RD Inpatient Contact Info in Amion

## 2024-03-27 NOTE — Progress Notes (Signed)
 [facility] PROGRESS NOTE   Vanessa Boyd ZOX:[id number] DOB: Nov 22, 1940 DOA: 03/25/2024  PCP: Bertha Broad, MD  Brief History:  83 y.o. female with medical history significant of HTN, HLD, hyperthyroidism, osteoporosis p/w BRBPR for a day.  She was hospitalized for further management.  She also experienced a mechanical fall at home hitting her head against the floor which resulted in a laceration.  This was sutured in the emergency department.  Consultants: Gastroenterology  Procedures: Suture of scalp laceration    Subjective/Interval History: Patient had a dark bloody bowel movement this AM. But not bloody bowel movements since then. Otherwise not complaints. Has been able to ambulate throughout the unity.     Assessment/Plan:  Hematochezia/acute blood loss anemia Most likely diverticular bleed.  Underwent CT angiogram which did not show any active bleed. Did experience significant drop in hemoglobin.  It appears that she was transfused 1 unit of PRBC in the emergency department. Hgb was 10.9 on admission, 7 this AM. GI consulted and recommended observation.  -Transfuse 1u prbcs this AM  -Continue to monitor for further bleeding and to ensure hemoglobin is stable.   Normocytic anemia  ABLA d/t diverticular bleed.  Check iron panel   Essential hypertension Patient was noted to be hypotensive initially in the emergency department.  Currently normotensive. Propranolol is currently on hold.  Osteoporosis  Noted.  On Prolia    GERD  Continue prilosec   T2DM A1c 5.4 07/2022. Hold metformin  and ozempic while inpatient Check A1c, hold in insulin  for now. Given great control in 2023.   HLD Noted Continue statin   H/o Hyperthryoidism Last TSH ~1 y ago 2.583   DVT Prophylaxis: SCDs Code Status: Full code Family Communication: Discussed with patient Disposition Plan: Home   Status is: Inpatient Remains inpatient appropriate because: GI bleed, required  transfusion this AM       Medications: Scheduled:  sodium chloride    Intravenous Once   ascorbic acid  500 mg Oral Daily   cholecalciferol  2,000 Units Oral Daily   gabapentin   600 mg Oral Daily   simvastatin   40 mg Oral QHS   Continuous: PRN:acetaminophen , ALPRAZolam  Antibiotics: Anti-infectives (From admission, onward)    None       Objective:  Vital Signs  Vitals:   03/27/24 0755 03/27/24 1119 03/27/24 1148 03/27/24 1400  BP: (!) 102/55 (!) 129/59 115/63 112/63  Pulse: (!) 105 73 84 90  Resp: 19 18 18 18   Temp: 98.2 F (36.8 C) 98.3 F (36.8 C) 98.2 F (36.8 C) 98.2 F (36.8 C)  TempSrc: Oral Oral    SpO2: 98% 100%    Weight:      Height:        Intake/Output Summary (Last 24 hours) at 03/27/2024 1549 Last data filed at 03/27/2024 1400 Gross per 24 hour  Intake 504 ml  Output 0 ml  Net 504 ml   Filed Weights   03/26/24 0608  Weight: 58 kg    Physical Exam  Constitutional: In no distress.  HEENT: Laceration above L eyebrow, stitched no erythema or drainage.  Cardiovascular: Normal rate, regular rhythm. No lower extremity edema  Pulmonary: Non labored breathing on room air, no wheezing or rales.  Abdominal: Soft. Normal bowel sounds. Non distended and non tender Musculoskeletal: Normal range of motion.     Neurological: Alert and oriented to person, place, and time. Non focal  Skin: Skin is warm and dry.    Lab Results:  Data Reviewed:  I have personally reviewed following labs and reports of the imaging studies  CBC: Recent Labs  Lab 03/25/24 0605 03/25/24 0609 03/25/24 1543 03/25/24 2115 03/26/24 0458 03/26/24 1821 03/27/24 0841  WBC 13.7*  --   --   --  6.9  --  8.2  NEUTROABS 10.8*  --   --   --   --   --   --   HGB 10.5*   < > 10.5* 9.1* 7.9* 7.8* 7.0*  HCT 31.8*   < > 31.6* 27.1* 24.2* 24.1* 21.1*  MCV 98.5  --   --   --  96.4  --  96.8  PLT 228  --   --   --  167  --  170   < > = values in this interval not displayed.     Basic Metabolic Panel: Recent Labs  Lab 03/25/24 0605 03/25/24 0609 03/26/24 0458 03/27/24 0442  NA 136 137 138 138  K 4.3 4.4 4.1 4.3  CL 104 105 108 109  CO2 22  --  24 23  GLUCOSE 139* 138* 115* 116*  BUN 25* 25* 14 11  CREATININE 0.82 0.80 0.71 0.78  CALCIUM  9.2  --  8.1* 8.2*    GFR: Estimated Creatinine Clearance: 42.7 mL/min (by C-G formula based on SCr of 0.78 mg/dL).  Liver Function Tests: Recent Labs  Lab 03/25/24 0605  AST 24  ALT 18  ALKPHOS 37*  BILITOT 0.5  PROT 6.0*  ALBUMIN 3.4*    Coagulation Profile: Recent Labs  Lab 03/25/24 0626  INR 1.1     Radiology Studies: No results found.      LOS: 2 days   [employer]  [facility] Pager on [email or web address]  03/27/2024, 3:49 PM

## 2024-03-27 NOTE — Care Management Important Message (Signed)
 Important Message  Patient Details  Name: Vanessa Boyd MRN: [id number] Date of Birth: 02-25-1941   Important Message Given:  Yes - Medicare IM     [name] 03/27/2024, 11:51 AM

## 2024-03-27 NOTE — Plan of Care (Signed)

## 2024-03-27 NOTE — Discharge Instructions (Addendum)
 High-Fiber Nutrition Therapy for when NOT IN DIVERTICULITIS FLARE  Fiber and fluid may help you feel less constipated and bloated and can also help ease diarrhea. Increase fiber slowly over the course of a few weeks. This will keep your symptoms from getting worse.  Tips Tips for Adding Fiber to Your Eating Plan Slowly increase the amount of fiber you eat to 25 to 35 grams per day. Eat whole grain breads and cereals. Look for choices with 100% whole wheat, rye, oats, or bran as the first or second ingredient. Have brown or wild rice instead of white rice or potatoes. Enjoy a variety of grains such as barley, oats, farro, kamut, and quinoa. Bake with whole wheat flour. You can use it to replace some white or all-purpose flour in recipes. Add dried beans and peas to casseroles or soups. Eat fruits and vegetables with peels or skins on. Choose fresh fruit and vegetables instead of juices. Check the Nutrition Facts labels and try to choose products with at least 4 g dietary fiber per serving. Drink at least 8 cups of fluid per day. You may need even more fluid as you eat higher amounts of fiber. Fluid helps your body process fiber without discomfort.  Foods High in Fiber (4 grams or more) Food Group Food Serving  Grains Cereal, bran  cup   Cereal, shredded wheat 1 cup   Oatmeal 1 cup   Popcorn 1 cup   Quinoa  cup   Wheat bran 3 tablespoons  Protein Foods Beans, canned, such as garbanzo or kidney  cup   Flaxseed, ground 2 tablespoons   Lentils  cup   Peas  cup   Soybeans  cup  Vegetables Potato with skin 1 medium   Mixed vegetables, frozen  cup  Fruit Blackberries or raspberries  cup   Coconut 1 ounce   Pear 1 medium  Foods Moderate in Fiber (1-3 grams) Food Group Food Serving  Grains Bread: whole wheat, cracked wheat, pumpernickel, or rye 1 slice   Bun, hot dog or hamburger 1   Crackers, whole grain 4   English muffin 1   Pasta: chickpea, lentil, or whole grain  cup    Rice: brown or wild  cup   Wheat germ 2 tablespoons   Whole grains: barley, bulgur, farro, freekeh, millet, or spelt  cup  Protein Foods Nuts, all types  cup   Nut butters: almond, cashew, peanut 2 tablespoons   Seeds: pumpkin or sesame 2 tablespoons   Veggie burger 1  Vegetables Beets  cup   Broccoli  cup   Brussels sprouts  cup   Cabbage  cup   Carrots  cup   Cauliflower  cup   Corn  cup   Eggplant  cup   Greens: beet, collard, kale, or turnip  cup   Green beans  cup   Okra  cup   Spinach  cup   Squash  cup   Tomato sauce  cup   Tomato 1 medium  Fruit Apple 1 medium   Applesauce  cup   Avocado  cup   Banana 1 medium   Blueberries, cranberries, or strawberries  cup   Cherries 10   Dates 4 small   Fruit, canned  cup   Grapefruit    Kiwi 1   Orange 1 medium   Papaya    Peach 1 medium   Pineapple  cup   Plum 1   Prune juice  cup   Prunes 4  Raisins  cup   Tangerine 1 medium     High Fiber Sample 1-Day Menu View Nutrient Info Breakfast 1/2 cup orange juice, with pulp 1/2 cup raisin bran 1 cup fat-free milk 1 cup coffee  Morning Snack 1 cup plain yogurt 2 cups water  Lunch 1 1/2 cups chili 1/2 cup kidney beans 1/2 cup soy crumble 2 tablespoons shredded cheese 8 whole wheat crackers 1 apple (with skin)  Evening Meal 2 ounces sliced chicken 1/4 cup tofu 2 cups mixed fresh vegetables 1 cup brown rice 1/2 cup strawberries 1 cup hot tea  Evening Snack 2 tablespoons almonds 1 cup hot chocolate  Daily Sum Nutrient Unit Value  Macronutrients  Energy kcal 2058  Energy kJ 8620  Protein g 109  Total lipid (fat) g 53  Carbohydrate, by difference g 308  Fiber, total dietary g 57  Sugars, total g 95  Minerals  Calcium , Ca mg 1535  Iron, Fe mg 28  Sodium, Na mg 3269  Vitamins  Vitamin C, total ascorbic acid mg 86  Vitamin A, IU IU 18169  Vitamin D IU 168  Lipids  Fatty acids, total saturated g 16  Fatty acids, total  monounsaturated g 19  Fatty acids, total polyunsaturated g 11  Cholesterol mg 131     High Fiber Vegan Sample 1-Day Menu View Nutrient Info Breakfast  cup bran cereal 1 banana  cup blueberries 1 cup soymilk fortified with calcium , vitamin B12, and vitamin D  Lunch  cup chili with beans with:  cup tempeh crumbles  cup crushed whole wheat crackers 1 apple 1 cup soymilk fortified with calcium , vitamin B12, and vitamin D  Evening Meal 1 veggie burger 1 whole wheat bun 1 leaf lettuce 1 slice tomato Salad made with: 1 cup lettuce  cup chickpeas  cucumbers 1 tablespoon italian dressing 1 cup strawberries  Evening Snack  cup almonds 1 cup carrot sticks  Daily Sum Nutrient Unit Value  Macronutrients  Energy kcal 1558  Energy kJ 6527  Protein g 77  Total lipid (fat) g 57  Carbohydrate, by difference g 208  Fiber, total dietary g 43  Sugars, total g 73  Minerals  Calcium , Ca mg 1209  Iron, Fe mg 23  Sodium, Na mg 1823  Vitamins  Vitamin C, total ascorbic acid mg 136  Vitamin A, IU IU 29397  Vitamin D IU 265  Lipids  Fatty acids, total saturated g 9  Fatty acids, total monounsaturated g 22  Fatty acids, total polyunsaturated g 21  Cholesterol mg 35     High Fiber Vegetarian (Lacto-Ovo) Sample 1-Day Menu View Nutrient Info Breakfast  cup bran cereal 1 banana  cup blueberries 1 cup 1% milk  Lunch 2 slices whole wheat bread 2 tablespoons hummus 1 ounce cheddar cheese 1 leaf lettuce 2 slices tomato  cup vegetarian baked beans 1 orange 1 cup 1% milk  Evening Meal Stir fry made with:  cup tempeh  cup brown rice 1 cup frozen broccoli 1 tablespoon soy sauce  cup peanuts 1 pear  Evening Snack 6 ounces fruit yogurt 1 cup air popped popcorn  Daily Sum Nutrient Unit Value  Macronutrients  Energy kcal 1786  Energy kJ 7476  Protein g 89  Total lipid (fat) g 52  Carbohydrate, by difference g 270  Fiber, total dietary g 40  Sugars, total g 133   Minerals  Calcium , Ca mg 1496  Iron, Fe mg 16  Sodium, Na mg 1889  Vitamins  Vitamin C,  total ascorbic acid mg 176  Vitamin A, IU IU 7107  Vitamin D IU 269  Lipids  Fatty acids, total saturated g 16  Fatty acids, total monounsaturated g 19  Fatty acids, total polyunsaturated g 11  Cholesterol mg 59    Copyright 2020  Academy [employer]. All rights reserved  ____________________________________________________________________________________  Low Fiber Nutrition Therapy for when IN A DIVERTICULITIS FLARE  You may need a low-fiber diet if you have Crohn's disease, diverticulitis, gastroparesis, ulcerative colitis, a new colostomy, or new ileostomy. A low-fiber diet may also be needed following radiation therapy to the pelvis and lower bowel or recent intestinal surgery.  A low-fiber diet reduces the frequency and volume of your stools. This lessens irritation to the gastrointestinal (GI) tract and can help you heal. Use this diet if you have a stricture so your intestine doesn't get blocked. The goal of this diet is to get less than 8 grams of fiber daily. It's also important to eat enough protein foods while you are on a low-fiber diet.  Drink nutrition supplements that have 1 gram of fiber or less in each serving. If your stricture is severe or if your inflammation is severe, drink more liquids to reduce symptoms and to get enough calories and protein.  Tips Eat about 5 to 6 small meals daily or about every 3 to 4 hours. Do not skip meals.  Every time you eat, include a small amount of protein (1 to 2 ounces) plus an additional food. Low fiber starch foods are the best choice to eat with protein.  Limit acidic, spicy and high-fat or fried and greasy foods to reduce GI symptoms.  Do not eat raw fruits and vegetables while on this diet. All fruits and vegetables need to be cooked and without peels or skins.  Drink a lot of fluids, at least 8 cups of fluid each day.  Limit drinks with caffeine, sugar, and sugar substitutes.  Plain water is the best choice. Avoid mixing drink packets or flavor drops into water. .  Take a chewable multivitamin with minerals. Gummy vitamins do not have enough minerals and can block an ostomy and non-chewable supplements are not easily digested. Chewable supplements must be used if you have a stricture or ostomy.  If you are lactose intolerant, you may need to eat low-lactose dairy products. If you can't tolerate dairy, ask your RDN about how you can get enough calcium  from other foods.  Do not take a calcium  supplement. They can cause a blockage.  It is important to add high-calcium  foods gradually to your diet and monitor for symptoms to avoid a blockage.  Do not add more fiber to your diet until your health care provider or registered dietitian nutritionist (RDN) tells you it's OK. Fiber is part of whole grains, fruits and vegetables (foods from plants) and needs to be slowly added back in to your diet when your body is healed.  Choose foods that have been safely handled and prepared to lower your risk of foodborne illness. Talk to your RDN or see the Food Safety Nutrition Therapy handout for more information.   Foods Recommended These foods are low in fat and fiber and will help with your GI symptoms. Food Group Foods Recommended  Grains  Choose grain foods with less than 2 grams of fiber per serving. Refined white flour products--for example, enriched white bread without seeds, crackers or pasta Cream of wheat or rice Grits (fine ground) Tortillas: white flour or  corn White rice, well-cooked (do not rinse, or soak before cooking) Cold and hot cereals made from white or refined flour such as puffed rice or corn flakes  Protein Foods  Lean, very tender, well-cooked poultry or fish; red meats: beef, pork or lamb (slow cook until soft; chop meats if you have stricture or ostomy) Eggs, well-cooked Smooth nut butters such as  almond, peanut, or sunflower Tofu  Dairy  If you have lactose intolerance, drinking milk products from cows or goats may make diarrhea worse. Foods marked with an asterisk (*) have lactose. Milk: fat-free, 1% or 2% * (choose best tolerated) Lactose-free milk Buttermilk* Fortified non-dairy milks: almond, cashew, coconut, or rice (be aware that these options are not good sources of protein so you will need to eat an additional protein food) Kefir* (Don't include kefir in the diet until approved by your health care provider) Yogurt*/lactose-free yogurt (without nuts, fruit, granola or chocolate) Mild cheese* (hard and aged cheeses tend to be lower in lactose such as cheddar, swiss or parmesan) Cottage cheese* or lactose-free cottage cheese Low-fat ice cream* or lactose-free ice cream Sherbet* (usually lower lactose)  Vegetables  Canned and well-cooked vegetables without seeds, skins, or hulls  Carrots or green beans, cooked White, red or yellow potatoes without skins Strained vegetable juice  Fruit Soft, and well-cooked fruits without skins, seeds, or membranes Canned fruit in juice: peaches, pears, or applesauce Fruit juice without pulp diluted by half with water may be tolerated better Fruit drinks fortified with vitamin C may be tolerated better than 100% fruit juice  Oils  When possible, choose healthy oils and fats, such as olive and canola oils, plant oils rather than solid fats.  Other  Broth and strained soups made from allowed foods Desserts (small portions) without whole grains, seeds, nuts, raisins, or coconut Jelly (clear)   Foods Not Recommended These foods are higher in fat and fiber and may make your GI symptoms worse.  Food Group Foods Not Recommended  Grains  Bread, whole wheat or with whole grain flour or seeds or nuts Brown rice, quinoa, kasha, barley Tortillas: whole grain Whole wheat pasta Whole grain and high-fiber cereals, including oatmeal, bran flakes or  shredded wheat Popcorn  Protein Foods  Steak, pork chops, or other meats that are fatty or have gristle Fried meat, poultry, or fish Seafood with a tough or rubbery texture, such as shrimp Luncheon meats such as bologna and salami Sausage, bacon, or hot dogs Dried beans, peas, or lentils Hummus Sushi Nuts and chunky nut butters  Dairy  Whole milk Pea milk and soymilk (may cause diarrhea, gas, bloating, and abdominal pain) Cream Half-and-half Sour cream Yogurt with added fruit, nuts, or granola or chocolate  Vegetables  Alfalfa or bean sprouts (high fiber and risk for bacteria) Raw or undercooked vegetables: beets; broccoli; brussels sprouts; cabbage; cauliflower; collard, mustard, or turnip greens; corn; cucumber; green peas or any kind of peas; kale; lima beans; mushrooms; okra; olives; pickles and relish; onions; parsnips; peppers; potato skins; sauerkraut; spinach; tomatoes  Fruit Raw fruit Dried fruit Avocado, berries, coconut Canned fruit in syrup Canned fruit with mandarin oranges, papaya or pineapple Fruit juice with pulp Prune juice Fruit skin  Oils  Pork rinds   Low-Fiber (8 grams) Sample 1-Day Menu  Breakfast  cup cream of wheat (0.5 gram fiber)  1 slice white toast (1 gram fiber)  1 teaspoon margarine, soft tub  2 scrambled eggs   Morning Snack 1 cup lactose-free nutrition supplement  Lunch 2 slices white bread (2 grams fiber)  3 tablespoons tuna  1 tablespoon mayonnaise  1 cup chicken noodle soup (1 gram fiber)   cup apple juice   Afternoon Snack 6 saltine crackers (0.5 gram fiber)  2 ounces low-fat cheddar cheese  Evening Meal 3 ounces tender chicken breast  1 cup white rice (0.5 gram fiber)   cup cooked canned green beans (2 grams fiber)   cup cranberry juice   Evening Snack 1 cup lactose-free nutrition supplement   Copyright 2020  Academy of [employer]etics  High Fiber Nutrition Therapy  Fiber and fluid may help you feel less constipated  and bloated and can also help ease diarrhea. Increase fiber slowly over the course of a few weeks. This will keep your symptoms from getting worse. Tips Tips for Adding Fiber to Your Eating Plan Slowly increase the amount of fiber you eat to 25 to 35 grams per day. Eat whole grain breads and cereals. Look for choices with 100% whole wheat, rye, oats, or bran as the first or second ingredient. Have brown or wild rice instead of white rice or potatoes. Enjoy a variety of grains. Good choices include barley, oats, farro, kamut, and quinoa. Bake with whole wheat flour. You can use it to replace some white or all-purpose flour in recipes. Enjoy baked beans more often! Add dried beans and peas to casseroles or soups. Choose fresh fruit and vegetables instead of juices. Eat fruits and vegetables with peels or skins on. Compare food labels of similar foods to find higher fiber choices. On packaged foods, the amount of fiber per serving is listed on the Nutrition Facts label. Check the Nutrition Facts labels and try to choose products with at least 4 g dietary fiber per serving. Drink plenty of fluids. Set a goal of at least 8 cups per day. You may need even more fluid as you eat higher amounts of fiber. Fluid helps your body process fiber without discomfort. Foods Recommended Foods With at Least 4 g Fiber per Serving Food Group Choose  Grains ?- cup high-fiber cereal  Dried beans and peas  cup cooked red beans, kidney beans, large lima beans, navy beans, pinto beans, white beans, lentils, or black-eyed peas  Vegetables 1 artichoke (cooked)  Fruits  cup blackberries or raspberries 4 dried prunes   Foods With 1 to 3 g Fiber per Serving Food Group Choose  Grains 1 bagel (3.5-inch diameter) 1 slice whole wheat, cracked wheat, pumpernickel, or rye bread 2-inch square cornbread 4 whole wheat crackers 1 bran, blueberry, cornmeal, or English muffin  cup cereal with 1-3 g fiber per serving (check  dietary fiber on the product's Nutrition Facts label) 2 tablespoons wheat germ or whole wheat flour  Fruits 1 apple (3-inch diameter) or  cup applesauce  cup apricots (canned) 1 banana  cup cherries (canned or fresh)  cup cranberries (fresh) 3 dates 2 medium figs (fresh)  cup fruit cocktail (canned)  grapefruit 1 kiwi fruit 1 orange (2-inch diameter) 1 peach (fresh) or  cup peaches (canned) 1 pear (fresh) or  cup pears (canned) 1 plum (2-inch diameter)  cup raisins  cup strawberries (fresh) 1 tangerine  Vegetables  cup bean sprouts (raw)  cup beets (diced, canned)  cup broccoli, brussels sprouts, or cabbage  (cooked)  cup carrots  cup cauliflower  cup corn  cup eggplant  cup okra (boiled)  cup potatoes (baked or mashed)  cup spinach, kale, or turnip greens (cooked)  cup squash--winter,  summer, or zucchini (cooked)  cup sweet potatoes or yams  cup tomatoes (canned)  Other 2 tablespoons almonds or peanuts 1 cup popcorn (popped)   High Fiber Vegetarian (Lacto-Ovo) Sample 1-Day Menu  Breakfast  cup bran cereal  1 banana  cup blueberries 1 cup 1% milk  Lunch 2 slices whole wheat bread  2 tablespoons hummus 1 ounce cheddar cheese 1 leaf lettuce 2 slices tomato  cup vegetarian baked beans 1 orange 1 cup 1% milk  Evening Meal Stir fry made with:  cup tempeh   cup brown rice 1 cup frozen broccoli 1 tablespoon soy sauce  cup peanuts  1 pear  Evening Snack 6 ounces fruit yogurt  1 cup air popped popcorn  High Fiber Vegan Sample 1-Day Menu  Breakfast  cup bran cereal  1 banana  cup blueberries 1 cup soymilk fortified with calcium , vitamin B12, and vitamin D  Lunch  cup chili with beans with:   cup tempeh crumbles  cup crushed whole wheat crackers 1 apple 1 cup soymilk fortified with calcium , vitamin B12, and vitamin D  Evening Meal 1 veggie burger  1 whole wheat bun  1 leaf lettuce  1 slice tomato Salad made with: 1 cup  lettuce  cup chickpeas   cucumbers 1 tablespoon italian dressing 1 cup strawberries   Evening Snack  cup almonds  1 cup carrot sticks  High Fiber Sample 1-Day Menu  Breakfast 1/2 cup orange juice, with pulp  1/2 cup raisin bran 1 cup fat-free milk 1 cup coffee  Morning Snack 1 cup plain yogurt  2 cups water  Lunch 1 1/2 cups chili  1/2 cup kidney beans 1/2 cup soy crumble 2 tablespoons shredded cheese 8 whole wheat crackers 1 apple (with skin)   Evening Meal 2 ounces sliced chicken  1/4 cup tofu 2 cups mixed fresh vegetables 1 cup brown rice 1/2 cup strawberries 1 cup hot tea  Evening Snack 2 tablespoons almonds  1 cup hot chocolate   Copyright 2020  [employer]y [employer]

## 2024-03-27 NOTE — Progress Notes (Incomplete)
 Patient refused bed alarm on, states" I didn't fall

## 2024-03-27 NOTE — Progress Notes (Signed)
      Progress Note   Subjective  She is passing some darker / maroon clots this AM. Hgb drifted from 7.8 to 7.0. She is not in any pain. Otherwise feels well, she is requesting to go home.    Objective   Vital signs in last 24 hours: Temp:  [98.1 F (36.7 C)-98.7 F (37.1 C)] 98.2 F (36.8 C) (06/06 0755) Pulse Rate:  [73-105] 105 (06/06 0755) Resp:  [18-19] 19 (06/06 0755) BP: (102-110)/(44-78) 102/55 (06/06 0755) SpO2:  [97 %-100 %] 98 % (06/06 0755) Last BM Date : 03/27/24 General:    white female in NAD Neurologic:  Alert and oriented,  grossly normal neurologically. Psych:  Cooperative. Normal mood and affect.  Intake/Output from previous day: No intake/output data recorded. Intake/Output this shift: No intake/output data recorded.  Lab Results: Recent Labs    03/25/24 0605 03/25/24 0609 03/26/24 0458 03/26/24 1821 03/27/24 0841  WBC 13.7*  --  6.9  --  8.2  HGB 10.5*   < > 7.9* 7.8* 7.0*  HCT 31.8*   < > 24.2* 24.1* 21.1*  PLT 228  --  167  --  170   < > = values in this interval not displayed.   BMET Recent Labs    03/25/24 0605 03/25/24 0609 03/26/24 0458 03/27/24 0442  NA 136 137 138 138  K 4.3 4.4 4.1 4.3  CL 104 105 108 109  CO2 22  --  24 23  GLUCOSE 139* 138* 115* 116*  BUN 25* 25* 14 11  CREATININE 0.82 0.80 0.71 0.78  CALCIUM  9.2  --  8.1* 8.2*   LFT Recent Labs    03/25/24 0605  PROT 6.0*  ALBUMIN 3.4*  AST 24  ALT 18  ALKPHOS 37*  BILITOT 0.5   PT/INR Recent Labs    03/25/24 0626  LABPROT 14.7  INR 1.1    Studies/Results: No results found.     Assessment / Plan:    83 y/o female here with the following:  Lower GI bleed - likely due to diverticulosis Post hemorrhagic anemia  Hemodynamically stable, seems to be slowly improving (compared to admission) but still having some low grade bleeding. It does appear to be slowing down but passed some older appearing clots while in was in the room. Hgb has continued to  drift. Recommend 1 unit PRBC today and continue to monitor. I do not think she is bleeding briskly enough that a CTA would be positive, but will need to monitor. I told her criteria for discharge is at least 24 hours of no bleeding. I do not think she is ready for discharge today. Yield of colonoscopy will be low, I don't think likely that other pathology is causing this (very likely diverticular bleed). If she has any significant bleeding would recommend repeat CTA to see if we can localize and consider embolization, her renal function is normal and would be a candidate for this if needed.  For now, continue soft diet, transfuse 1 unit PRBC, trend Hgb.   We will reassess her tomorrow. Call with questions in the interim.  Christi Coward, MD [facility] Gastroenter[facility]

## 2024-03-28 DIAGNOSIS — D62 Acute posthemorrhagic anemia: Secondary | ICD-10-CM

## 2024-03-28 DIAGNOSIS — K5790 Diverticulosis of intestine, part unspecified, without perforation or abscess without bleeding: Secondary | ICD-10-CM | POA: Diagnosis not present

## 2024-03-28 DIAGNOSIS — K5731 Diverticulosis of large intestine without perforation or abscess with bleeding: Secondary | ICD-10-CM

## 2024-03-28 LAB — BPAM RBC
Blood Product Expiration Date: 202506132359
Blood Product Expiration Date: 202506252359
ISSUE DATE / TIME: 202506040613
ISSUE DATE / TIME: 202506061129
Unit Type and Rh: 5100
Unit Type and Rh: 6200

## 2024-03-28 LAB — TYPE AND SCREEN
ABO/RH(D): A POS
Antibody Screen: NEGATIVE
Unit division: 0
Unit division: 0

## 2024-03-28 LAB — CBC
HCT: 25.1 % — ABNORMAL LOW (ref 36.0–46.0)
Hemoglobin: 8.5 g/dL — ABNORMAL LOW (ref 12.0–15.0)
MCH: 31.5 pg (ref 26.0–34.0)
MCHC: 33.9 g/dL (ref 30.0–36.0)
MCV: 93 fL (ref 80.0–100.0)
Platelets: 190 10*3/uL (ref 150–400)
RBC: 2.7 MIL/uL — ABNORMAL LOW (ref 3.87–5.11)
RDW: 15.6 % — ABNORMAL HIGH (ref 11.5–15.5)
WBC: 6.9 10*3/uL (ref 4.0–10.5)
nRBC: 0 % (ref 0.0–0.2)

## 2024-03-28 LAB — BASIC METABOLIC PANEL WITH GFR
Anion gap: 4 — ABNORMAL LOW (ref 5–15)
BUN: 8 mg/dL (ref 8–23)
CO2: 26 mmol/L (ref 22–32)
Calcium: 8.8 mg/dL — ABNORMAL LOW (ref 8.9–10.3)
Chloride: 110 mmol/L (ref 98–111)
Creatinine, Ser: 0.61 mg/dL (ref 0.44–1.00)
GFR, Estimated: >60 mL/min (ref >60)
Glucose, Bld: 112 mg/dL — ABNORMAL HIGH (ref 70–99)
Potassium: 3.9 mmol/L (ref 3.5–5.1)
Sodium: 140 mmol/L (ref 135–145)

## 2024-03-28 LAB — IRON AND TIBC
Iron: 25 ug/dL — ABNORMAL LOW (ref 28–170)
Saturation Ratios: 7 % — ABNORMAL LOW (ref 10.4–31.8)
TIBC: 350 ug/dL (ref 250–450)
UIBC: 325 ug/dL

## 2024-03-28 LAB — FERRITIN: Ferritin: 25 ng/mL (ref 11–307)

## 2024-03-28 LAB — TSH: TSH: 1.187 u[IU]/mL (ref 0.350–4.500)

## 2024-03-28 MED ORDER — FERROUS SULFATE 325 (65 FE) MG PO TBEC
325.0000 mg | DELAYED_RELEASE_TABLET | Freq: Every day | ORAL | Status: DC
  Filled 2024-03-28: qty 1

## 2024-03-28 MED ORDER — IRON SUCROSE 400 MG IVPB - SIMPLE MED
400.0000 mg | Freq: Every day | Status: AC
  Administered 2024-03-28 – 2024-03-29 (×2): 400 mg via INTRAVENOUS
  Filled 2024-03-28: qty 400
  Filled 2024-03-28: qty 270

## 2024-03-28 MED ORDER — FERROUS SULFATE 325 (65 FE) MG PO TABS
325.0000 mg | ORAL_TABLET | Freq: Every day | ORAL | Status: DC
  Administered 2024-03-29 – 2024-03-31 (×3): 325 mg via ORAL
  Filled 2024-03-28 (×3): qty 1

## 2024-03-28 NOTE — Plan of Care (Signed)

## 2024-03-28 NOTE — Progress Notes (Signed)
      Progress Note   Subjective  Patient had no further BMs yesterday or overnight. This AM she passed some dark stool, darker than before. Otherwise no pain.    Objective   Vital signs in last 24 hours: Temp:  [98.2 F (36.8 C)-98.8 F (37.1 C)] 98.3 F (36.8 C) (06/07 0757) Pulse Rate:  [73-100] 81 (06/07 0513) Resp:  [16-19] 18 (06/07 0757) BP: (97-136)/(59-72) 134/72 (06/07 0757) SpO2:  [97 %-100 %] 99 % (06/07 0757) Last BM Date : 03/27/24 General:    white female in NAD Neurologic:  Alert and oriented,  grossly normal neurologically. Psych:  Cooperative. Normal mood and affect.  Intake/Output from previous day: 06/06 0701 - 06/07 0700 In: 1004 [P.O.:700; I.V.:10; Blood:294] Out: 0  Intake/Output this shift: Total I/O In: 240 [P.O.:240] Out: -   Lab Results: Recent Labs    03/26/24 0458 03/26/24 1821 03/27/24 0841 03/28/24 0606  WBC 6.9  --  8.2 6.9  HGB 7.9* 7.8* 7.0* 8.5*  HCT 24.2* 24.1* 21.1* 25.1*  PLT 167  --  170 190   BMET Recent Labs    03/26/24 0458 03/27/24 0442 03/28/24 0606  NA 138 138 140  K 4.1 4.3 3.9  CL 108 109 110  CO2 24 23 26   GLUCOSE 115* 116* 112*  BUN 14 11 8   CREATININE 0.71 0.78 0.61  CALCIUM  8.1* 8.2* 8.8*   LFT No results for input(s): "PROT", "ALBUMIN", "AST", "ALT", "ALKPHOS", "BILITOT", "BILIDIR", "IBILI" in the last 72 hours. PT/INR No results for input(s): "LABPROT", "INR" in the last 72 hours.  Studies/Results: No results found.     Assessment / Plan:    83 y/o female here with the following:   Lower GI bleed - likely due to diverticulosis Post hemorrhagic anemia   Got 1 unit PRBC yesterday, Hgb went from 7/0 to 8.5. No BMs yesterday or overnight but passed very dark blood today. Suspected older blood and that she is improving. Given duration of her symptoms, I think reasonable to keep her inpatient today for further monitoring. If no further active bleeding and Hgb stable tomorrow, I think she can go  home tomorrow. Continue regular diet. Contact us  in the interim with any significant rebleeding.   Yield of colonoscopy will be low at this time, I don't think likely that other pathology is causing this (very likely diverticular bleed). If she has any significant bleeding would recommend repeat CTA to see if we can localize and consider embolization, her renal function is normal and would be a candidate for this if needed.     We will reassess her tomorrow. Call with questions in the interim.   Christi Coward, MD [facility] Gastroenterology

## 2024-03-28 NOTE — Progress Notes (Signed)
 [facility] PROGRESS NOTE   Vanessa Boyd WUJ:[id number] DOB: 03/10/1941 DOA: 03/25/2024  PCP: Bertha Broad, MD  Brief History:  83 y.o. female with medical history significant of HTN, HLD, hyperthyroidism, osteoporosis p/w BRBPR for a day.  She was hospitalized for further management.  She also experienced a mechanical fall at home hitting her head against the floor which resulted in a laceration.  This was sutured in the emergency department.  Consultants: Gastroenterology  Procedures: Suture of scalp laceration  Subjective/Interval History: Dark bowel movement this AM. No abdominal pain.    Assessment/Plan:  Hematochezia/acute blood loss anemia Most likely diverticular bleed.  Underwent CT angiogram which did not show any active bleed. Did experience significant drop in hemoglobin.  It appears that she was transfused 1 unit of PRBC in the emergency department. Hgb was 10.9 on admission, down to 7 (6/6). Received 1u of PRBC responded appropriately.   -Continue to monitor for further bleeding and to ensure hemoglobin is stable.   Normocytic anemia  ABLA d/t diverticular bleed.  Iron/TIBC/Ferritin/ %Sat    Component Value Date/Time   IRON 25 (L) 03/28/2024 0606   TIBC 350 03/28/2024 0606   FERRITIN 25 03/28/2024 0606   IRONPCTSAT 7 (L) 03/28/2024 0606   Replace with IV Fe while inpatient.   Essential hypertension Patient was noted to be hypotensive initially in the emergency department.  Currently normotensive. Propranolol is currently on hold.  Osteoporosis  Noted.  On Prolia    GERD  Continue prilosec   T2DM A1c 5.4 07/2022. Hold metformin  and ozempic while inpatient Check A1c, hold in insulin  for now. Given great control in 2023.   HLD Noted Continue statin   H/o Hyperthryoidism Last TSH ~1 y ago 2.583 TSH normal   DVT Prophylaxis: SCDs Code Status: Full code Family Communication: Discussed with patient Disposition Plan: Home   Status  is: Inpatient Remains inpatient appropriate because: GI bleed, further monitoring of her hemoglobin       Medications: Scheduled:  sodium chloride    Intravenous Once   ascorbic acid   500 mg Oral Daily   cholecalciferol   2,000 Units Oral Daily   gabapentin   600 mg Oral Daily   simvastatin   40 mg Oral QHS   Continuous: PRN:acetaminophen , ALPRAZolam   Antibiotics: Anti-infectives (From admission, onward)    None       Objective:  Vital Signs  Vitals:   03/27/24 1707 03/27/24 1946 03/28/24 0513 03/28/24 0757  BP: 97/68 134/64 136/67 134/72  Pulse: 100 96 81   Resp: 19 16 17 18   Temp: 98.2 F (36.8 C) 98.8 F (37.1 C) 98.3 F (36.8 C) 98.3 F (36.8 C)  TempSrc:  Oral Oral Oral  SpO2: 98% 97% 97% 99%  Weight:      Height:        Intake/Output Summary (Last 24 hours) at 03/28/2024 0904 Last data filed at 03/28/2024 0800 Gross per 24 hour  Intake 1244 ml  Output 0 ml  Net 1244 ml   Filed Weights   03/26/24 0608  Weight: 58 kg    Physical Exam  Constitutional: In no distress.  HEENT: Laceration above L eyebrow, stitched no erythema or drainage.  Cardiovascular: Normal rate, regular rhythm. No lower extremity edema  Pulmonary: Non labored breathing on room air, no wheezing or rales.   Abdominal: Soft. Normal bowel sounds. Non distended and non tender Musculoskeletal: Normal range of motion.     Neurological: Alert and oriented to person, place, and time. Non focal  Skin: Skin is warm and dry.   Lab Results:  Data Reviewed: I have personally reviewed following labs and reports of the imaging studies  CBC: Recent Labs  Lab 03/25/24 0605 03/25/24 0609 03/25/24 2115 03/26/24 0458 03/26/24 1821 03/27/24 0841 03/28/24 0606  WBC 13.7*  --   --  6.9  --  8.2 6.9  NEUTROABS 10.8*  --   --   --   --   --   --   HGB 10.5*   < > 9.1* 7.9* 7.8* 7.0* 8.5*  HCT 31.8*   < > 27.1* 24.2* 24.1* 21.1* 25.1*  MCV 98.5  --   --  96.4  --  96.8 93.0  PLT 228  --   --   167  --  170 190   < > = values in this interval not displayed.    Basic Metabolic Panel: Recent Labs  Lab 03/25/24 0605 03/25/24 0609 03/26/24 0458 03/27/24 0442 03/28/24 0606  NA 136 137 138 138 140  K 4.3 4.4 4.1 4.3 3.9  CL 104 105 108 109 110  CO2 22  --  24 23 26   GLUCOSE 139* 138* 115* 116* 112*  BUN 25* 25* 14 11 8   CREATININE 0.82 0.80 0.71 0.78 0.61  CALCIUM  9.2  --  8.1* 8.2* 8.8*    GFR: Estimated Creatinine Clearance: 42.7 mL/min (by C-G formula based on SCr of 0.61 mg/dL).  Liver Function Tests: Recent Labs  Lab 03/25/24 0605  AST 24  ALT 18  ALKPHOS 37*  BILITOT 0.5  PROT 6.0*  ALBUMIN 3.4*    Coagulation Profile: Recent Labs  Lab 03/25/24 0626  INR 1.1     Radiology Studies: No results found.    LOS: 3 days   [employer]  [facility] Pager on [email or web address]  03/28/2024, 9:04 AM

## 2024-03-29 DIAGNOSIS — K5731 Diverticulosis of large intestine without perforation or abscess with bleeding: Secondary | ICD-10-CM | POA: Diagnosis not present

## 2024-03-29 LAB — CBC
HCT: 29 % — ABNORMAL LOW (ref 36.0–46.0)
HCT: 28.9 % — ABNORMAL LOW (ref 36.0–46.0)
Hemoglobin: 9.6 g/dL — ABNORMAL LOW (ref 12.0–15.0)
Hemoglobin: 9.6 g/dL — ABNORMAL LOW (ref 12.0–15.0)
MCH: 31.5 pg (ref 26.0–34.0)
MCH: 31.6 pg (ref 26.0–34.0)
MCHC: 33.1 g/dL (ref 30.0–36.0)
MCHC: 33.2 g/dL (ref 30.0–36.0)
MCV: 95.1 fL (ref 80.0–100.0)
MCV: 95.1 fL (ref 80.0–100.0)
Platelets: 271 10*3/uL (ref 150–400)
Platelets: 278 10*3/uL (ref 150–400)
RBC: 3.05 MIL/uL — ABNORMAL LOW (ref 3.87–5.11)
RBC: 3.04 MIL/uL — ABNORMAL LOW (ref 3.87–5.11)
RDW: 15.4 % (ref 11.5–15.5)
RDW: 15.3 % (ref 11.5–15.5)
WBC: 8.1 10*3/uL (ref 4.0–10.5)
WBC: 9.4 10*3/uL (ref 4.0–10.5)
nRBC: 0 % (ref 0.0–0.2)
nRBC: 0 % (ref 0.0–0.2)

## 2024-03-29 LAB — BASIC METABOLIC PANEL WITH GFR
Anion gap: 4 — ABNORMAL LOW (ref 5–15)
BUN: 8 mg/dL (ref 8–23)
CO2: 27 mmol/L (ref 22–32)
Calcium: 8.9 mg/dL (ref 8.9–10.3)
Chloride: 107 mmol/L (ref 98–111)
Creatinine, Ser: 0.6 mg/dL (ref 0.44–1.00)
GFR, Estimated: >60 mL/min (ref >60)
Glucose, Bld: 113 mg/dL — ABNORMAL HIGH (ref 70–99)
Potassium: 3.6 mmol/L (ref 3.5–5.1)
Sodium: 138 mmol/L (ref 135–145)

## 2024-03-29 NOTE — Progress Notes (Signed)
      Progress Note   Subjective  Patient had been doing well after having a dark stool yesterday AM, concerning for old blood. Tolerated diet, then this AM had a BM with bright red blood. Hgb stable.   Objective   Vital signs in last 24 hours: Temp:  [97.6 F (36.4 C)-98.5 F (36.9 C)] 98.2 F (36.8 C) (06/08 0844) Pulse Rate:  [68-100] 99 (06/08 0844) Resp:  [17-20] 20 (06/08 0750) BP: (121-152)/(58-79) 125/75 (06/08 0844) SpO2:  [96 %-97 %] 97 % (06/08 0844) Last BM Date : 03/29/24 General:    white female in NAD Neurologic:  Alert and oriented,  grossly normal neurologically. Psych:  Cooperative. Normal mood and affect.  Intake/Output from previous day: 06/07 0701 - 06/08 0700 In: 580 [P.O.:300; IV Piggyback:280] Out: -  Intake/Output this shift: Total I/O In: 236 [P.O.:236] Out: -   Lab Results: Recent Labs    03/28/24 0606 03/29/24 0713 03/29/24 1212  WBC 6.9 8.1 9.4  HGB 8.5* 9.6* 9.6*  HCT 25.1* 29.0* 28.9*  PLT 190 271 278   BMET Recent Labs    03/27/24 0442 03/28/24 0606 03/29/24 0713  NA 138 140 138  K 4.3 3.9 3.6  CL 109 110 107  CO2 23 26 27   GLUCOSE 116* 112* 113*  BUN 11 8 8   CREATININE 0.78 0.61 0.60  CALCIUM  8.2* 8.8* 8.9   LFT No results for input(s): "PROT", "ALBUMIN", "AST", "ALT", "ALKPHOS", "BILITOT", "BILIDIR", "IBILI" in the last 72 hours. PT/INR No results for input(s): "LABPROT", "INR" in the last 72 hours.  Studies/Results: No results found.     Assessment / Plan:    83 y/o female here with the following:   Lower GI bleed - likely due to diverticulosis Post hemorrhagic anemia   Yesterday had a dark stool, old blood, we had thought she was resolving this and doing better. Was thinking of discharge today as Hgb remains stable but had recurrent bright red blood this AM. Repeat Hgb is stable. She feels well.   I think she is having a stuttering diverticular bleed. Hopefully with more time she can resolve this. If she  has recurrent active bleeding today would get a CTA. Again think  yield of colonoscopy will be low at this time, I don't think likely that other pathology is causing this (very likely diverticular bleed).   Continue bland diet. Will need to have no bleeding for at least 24 hours prior to considering discharge.    Dr. Nandigam to assume her inpatient care in the AM.  Call with questions in the interim.  ,Christi Coward, MD [facility] Gastroenterology

## 2024-03-29 NOTE — Progress Notes (Incomplete)
 Patient ID: Nylan Nakatani Cowdery, female   DOB: 08/07/1941, 83 y.o.   MRN: [id number]    Progress Note   Subjective   Day # 5 CC; acute diverticular bleed  Labs -WBC 8.1/hgb 9.6 -improved Bun 8/creat 0.6   Objective   Vital signs in last 24 hours: Temp:  [97.6 F (36.4 C)-98.5 F (36.9 C)] 98.2 F (36.8 C) (06/08 0844) Pulse Rate:  [68-100] 99 (06/08 0844) Resp:  [17-20] 20 (06/08 0750) BP: (121-152)/(58-79) 125/75 (06/08 0844) SpO2:  [96 %-97 %] 97 % (06/08 0844) Last BM Date : 03/29/24 General:    white female in NAD Heart:  Regular rate and rhythm; no murmurs Lungs: Respirations even and unlabored, lungs CTA bilaterally Abdomen:  Soft, nontender and nondistended. Normal bowel sounds. Extremities:  Without edema. Neurologic:  Alert and oriented,  grossly normal neurologically. Psych:  Cooperative. Normal mood and affect.  Intake/Output from previous day: 06/07 0701 - 06/08 0700 In: 580 [P.O.:300; IV Piggyback:280] Out: -  Intake/Output this shift: No intake/output data recorded.  Lab Results: Recent Labs    03/27/24 0841 03/28/24 0606 03/29/24 0713  WBC 8.2 6.9 8.1  HGB 7.0* 8.5* 9.6*  HCT 21.1* 25.1* 29.0*  PLT 170 190 271   BMET Recent Labs    03/27/24 0442 03/28/24 0606 03/29/24 0713  NA 138 140 138  K 4.3 3.9 3.6  CL 109 110 107  CO2 23 26 27   GLUCOSE 116* 112* 113*  BUN 11 8 8   CREATININE 0.78 0.61 0.60  CALCIUM  8.2* 8.8* 8.9   LFT No results for input(s): "PROT", "ALBUMIN", "AST", "ALT", "ALKPHOS", "BILITOT", "BILIDIR", "IBILI" in the last 72 hours. PT/INR No results for input(s): "LABPROT", "INR" in the last 72 hours.  Studies/Results: No results found.     Assessment / Plan:        Principal Problem:   GIB (gastrointestinal bleeding) Active Problems:   Lower GI bleed   Acute post-hemorrhagic anemia   Diverticulosis   Diverticulosis of colon with hemorrhage     LOS: 4 days   Hazen Brumett EsterwoodPA-C  03/29/2024, 11:43 AM

## 2024-03-29 NOTE — Progress Notes (Addendum)
 [facility] PROGRESS NOTE   Vanessa Boyd FIE:[id number] DOB: [birth year] DOA: 03/25/2024  PCP: Bertha Broad, MD  Brief History:  83 y.o. female with medical history significant of HTN, HLD, hyperthyroidism, osteoporosis p/w BRBPR for a day.  She was hospitalized for further management.  She also experienced a mechanical fall at home hitting her head against the floor which resulted in a laceration.  This was sutured in the emergency department.  Consultants: Gastroenterology  Procedures: Suture of scalp laceration  Subjective/Interval History: Some bloody bowel movements this AM.    Assessment/Plan:  Hematochezia/acute blood loss anemia Most likely diverticular bleed.  Underwent CT angiogram which did not show any active bleed. Did experience significant drop in hemoglobin.  It appears that she was transfused 1 unit of PRBC in the emergency department. Hgb was 10.9 on admission, down to 7 (6/6). Received 1u of PRBC responded appropriately.  Hgb now stable but had some bowel movements.   -Continue to monitor for further bleeding and to ensure hemoglobin is stable.   Normocytic anemia  ABLA d/t diverticular bleed.  Iron/TIBC/Ferritin/ %Sat    Component Value Date/Time   IRON 25 (L) 03/28/2024 0606   TIBC 350 03/28/2024 0606   FERRITIN 25 03/28/2024 0606   IRONPCTSAT 7 (L) 03/28/2024 0606      Latest Ref Rng & Units 03/29/2024   12:12 PM 03/29/2024    7:13 AM 03/28/2024    6:06 AM  CBC  WBC 4.0 - 10.5 K/uL 9.4  8.1  6.9   Hemoglobin 12.0 - 15.0 g/dL 9.6  9.6  8.5   Hematocrit 36.0 - 46.0 % 28.9  29.0  25.1   Platelets 150 - 400 K/uL 278  271  190    Replace with IV Fe while inpatient.  Hgb stable   Essential hypertension Patient was noted to be hypotensive initially in the emergency department.  Currently normotensive.   Osteoporosis  Noted.  On Prolia    GERD  Continue prilosec   T2DM A1c 5.4 07/2022. Hold metformin  and ozempic while inpatient Check  A1c, hold in insulin  for now. Given great control in 2023.   HLD Noted Continue statin   H/o Hyperthryoidism Last TSH ~1 y ago 2.583 TSH normal   H/o Tremors  On PRN propranolol for this   DVT Prophylaxis: SCDs Code Status: Full code Family Communication: Discussed with patient Disposition Plan: Home   Status is: Inpatient Remains inpatient appropriate because: GI bleed, further monitoring of her hemoglobin       Medications: Scheduled:  sodium chloride    Intravenous Once   ascorbic acid   500 mg Oral Daily   cholecalciferol   2,000 Units Oral Daily   ferrous sulfate   325 mg Oral Q breakfast   gabapentin   600 mg Oral Daily   simvastatin   40 mg Oral QHS   Continuous: PRN:acetaminophen , ALPRAZolam   Antibiotics: Anti-infectives (From admission, onward)    None       Objective:  Vital Signs  Vitals:   03/28/24 1942 03/29/24 0508 03/29/24 0750 03/29/24 0844  BP: (!) 140/66 121/79 (!) 152/71 125/75  Pulse: 75 68 100 99  Resp: 17 17 20    Temp: 98.4 F (36.9 C) 97.6 F (36.4 C) 98.3 F (36.8 C) 98.2 F (36.8 C)  TempSrc:  Oral Oral Oral  SpO2: 96% 96% 97% 97%  Weight:      Height:        Intake/Output Summary (Last 24 hours) at 03/29/2024 1545 Last data filed at  03/29/2024 1100 Gross per 24 hour  Intake 576 ml  Output --  Net 576 ml   Filed Weights   03/26/24 0608  Weight: 58 kg      Physical Exam  Constitutional: In no distress.  HEENT: Lac above L eyebrow.  Cardiovascular: Normal rate, regular rhythm. No lower extremity edema  Pulmonary: Non labored breathing on room air, no wheezing or rales.   Abdominal: Soft. Normal bowel sounds. Non distended and non tender Musculoskeletal: Normal range of motion.     Neurological: Alert and oriented to person, place, and time. Non focal  Skin: Skin is warm and dry.    Lab Results:  Data Reviewed: I have personally reviewed following labs and reports of the imaging studies  CBC: Recent Labs  Lab  03/25/24 0605 03/25/24 0609 03/26/24 0458 03/26/24 1821 03/27/24 0841 03/28/24 0606 03/29/24 0713 03/29/24 1212  WBC 13.7*  --  6.9  --  8.2 6.9 8.1 9.4  NEUTROABS 10.8*  --   --   --   --   --   --   --   HGB 10.5*   < > 7.9* 7.8* 7.0* 8.5* 9.6* 9.6*  HCT 31.8*   < > 24.2* 24.1* 21.1* 25.1* 29.0* 28.9*  MCV 98.5  --  96.4  --  96.8 93.0 95.1 95.1  PLT 228  --  167  --  170 190 271 278   < > = values in this interval not displayed.    Basic Metabolic Panel: Recent Labs  Lab 03/25/24 0605 03/25/24 0609 03/26/24 0458 03/27/24 0442 03/28/24 0606 03/29/24 0713  NA 136 137 138 138 140 138  K 4.3 4.4 4.1 4.3 3.9 3.6  CL 104 105 108 109 110 107  CO2 22  --  24 23 26 27   GLUCOSE 139* 138* 115* 116* 112* 113*  BUN 25* 25* 14 11 8 8   CREATININE 0.82 0.80 0.71 0.78 0.61 0.60  CALCIUM  9.2  --  8.1* 8.2* 8.8* 8.9    GFR: Estimated Creatinine Clearance: 42.7 mL/min (by C-G formula based on SCr of 0.6 mg/dL).  Liver Function Tests: Recent Labs  Lab 03/25/24 0605  AST 24  ALT 18  ALKPHOS 37*  BILITOT 0.5  PROT 6.0*  ALBUMIN 3.4*    Coagulation Profile: Recent Labs  Lab 03/25/24 0626  INR 1.1     Radiology Studies: No results found.    LOS: 4 days   [employer]  [facility] Pager on [email or web address]  03/29/2024, 3:45 PM

## 2024-03-30 DIAGNOSIS — K5791 Diverticulosis of intestine, part unspecified, without perforation or abscess with bleeding: Secondary | ICD-10-CM | POA: Diagnosis not present

## 2024-03-30 DIAGNOSIS — D62 Acute posthemorrhagic anemia: Secondary | ICD-10-CM | POA: Diagnosis not present

## 2024-03-30 DIAGNOSIS — K922 Gastrointestinal hemorrhage, unspecified: Secondary | ICD-10-CM | POA: Diagnosis not present

## 2024-03-30 LAB — CBC
HCT: 28.5 % — ABNORMAL LOW (ref 36.0–46.0)
Hemoglobin: 9.2 g/dL — ABNORMAL LOW (ref 12.0–15.0)
MCH: 31.2 pg (ref 26.0–34.0)
MCHC: 32.3 g/dL (ref 30.0–36.0)
MCV: 96.6 fL (ref 80.0–100.0)
Platelets: 295 10*3/uL (ref 150–400)
RBC: 2.95 MIL/uL — ABNORMAL LOW (ref 3.87–5.11)
RDW: 15.6 % — ABNORMAL HIGH (ref 11.5–15.5)
WBC: 8 10*3/uL (ref 4.0–10.5)
nRBC: 0.3 % — ABNORMAL HIGH (ref 0.0–0.2)

## 2024-03-30 LAB — HEMOGLOBIN A1C
Hgb A1c MFr Bld: 5.3 % (ref 4.8–5.6)
Mean Plasma Glucose: 105 mg/dL

## 2024-03-30 NOTE — Progress Notes (Signed)
 [facility] PROGRESS NOTE   Vanessa Boyd ZOX:[id number] DOB: [birth year] DOA: 03/25/2024  PCP: Bertha Broad, MD  Brief History:  83 y.o. female with medical history significant of HTN, HLD, hyperthyroidism, osteoporosis p/w BRBPR for a day.  She was hospitalized for further management.  She also experienced a mechanical fall at home hitting her head against the floor which resulted in a laceration.  This was sutured in the emergency department.  Consultants: Gastroenterology  Procedures: Suture of scalp laceration  Subjective/Interval History: Patient had couple of bowel movements earlier this morning.  No significant blood was noted.  Denies any abdominal pain nausea vomiting.  No dizziness or lightheadedness.   Assessment/Plan:  Hematochezia/acute blood loss anemia Most likely diverticular bleed.  Underwent CT angiogram at admission which did not show any active bleed. It appears that she was transfused 1 unit of PRBC in the emergency department.  Hemoglobin dropped again and was transfused another unit of PRBC. Hemoglobin now stable for the last 48 hours. Bleeding appears to have subsided. Await GI input today.  Patient wondering if she can go home today.   It appears that she was also given IV iron during this hospital stay.  Currently on oral iron.    Essential hypertension Patient was noted to be hypotensive initially in the emergency department.  Currently normotensive.  Currently not on any antihypertensives.  Prior to admission she was on propranolol as needed.  Osteoporosis  Noted.  On Prolia    GERD  Continue prilosec   T2DM HbA1c 5.3.  Holding metformin  and Ozempic.    HLD Noted Continue statin   H/o Hyperthryoidism Last TSH ~1 y ago 2.583 TSH normal   H/o Tremors  On PRN propranolol for this   DVT Prophylaxis: SCDs Code Status: Full code Family Communication: Discussed with patient Disposition Plan: Home when cleared by  gastroenterology    Medications: Scheduled:  sodium chloride    Intravenous Once   ascorbic acid   500 mg Oral Daily   cholecalciferol   2,000 Units Oral Daily   ferrous sulfate   325 mg Oral Q breakfast   gabapentin   600 mg Oral Daily   simvastatin   40 mg Oral QHS   Continuous: PRN:acetaminophen , ALPRAZolam    Objective:  Vital Signs  Vitals:   03/29/24 1618 03/29/24 2243 03/30/24 0449 03/30/24 0853  BP: 130/70 139/66 139/74 123/68  Pulse: 79 82 90 92  Resp: 18 17 16 19   Temp: 98.3 F (36.8 C) 97.9 F (36.6 C) 97.8 F (36.6 C) 98.2 F (36.8 C)  TempSrc: Oral Oral Oral Oral  SpO2: 96% 96% 98% 98%  Weight:      Height:        Intake/Output Summary (Last 24 hours) at 03/30/2024 1054 Last data filed at 03/30/2024 0949 Gross per 24 hour  Intake 478 ml  Output 0 ml  Net 478 ml   Filed Weights   03/26/24 0608  Weight: 58 kg    Physical Exam   General appearance: Awake alert.  In no distress Resp: Clear to auscultation bilaterally.  Normal effort Cardio: S1-S2 is normal regular.  No S3-S4.  No rubs murmurs or bruit GI: Abdomen is soft.  Nontender nondistended.  Bowel sounds are present normal.  No masses organomegaly Extremities: No edema.  Full range of motion of lower extremities. Neurologic: Alert and oriented x3.  No focal neurological deficits.     Lab Results:  Data Reviewed: I have personally reviewed following labs and reports of the imaging studies  CBC: Recent Labs  Lab 03/25/24 0605 03/25/24 0609 03/27/24 0841 03/28/24 0606 03/29/24 0713 03/29/24 1212 03/30/24 0521  WBC 13.7*   < > 8.2 6.9 8.1 9.4 8.0  NEUTROABS 10.8*  --   --   --   --   --   --   HGB 10.5*   < > 7.0* 8.5* 9.6* 9.6* 9.2*  HCT 31.8*   < > 21.1* 25.1* 29.0* 28.9* 28.5*  MCV 98.5   < > 96.8 93.0 95.1 95.1 96.6  PLT 228   < > 170 190 271 278 295   < > = values in this interval not displayed.    Basic Metabolic Panel: Recent Labs  Lab 03/25/24 0605 03/25/24 0609  03/26/24 0458 03/27/24 0442 03/28/24 0606 03/29/24 0713  NA 136 137 138 138 140 138  K 4.3 4.4 4.1 4.3 3.9 3.6  CL 104 105 108 109 110 107  CO2 22  --  24 23 26 27   GLUCOSE 139* 138* 115* 116* 112* 113*  BUN 25* 25* 14 11 8 8   CREATININE 0.82 0.80 0.71 0.78 0.61 0.60  CALCIUM  9.2  --  8.1* 8.2* 8.8* 8.9    GFR: Estimated Creatinine Clearance: 42.7 mL/min (by C-G formula based on SCr of 0.6 mg/dL).  Liver Function Tests: Recent Labs  Lab 03/25/24 0605  AST 24  ALT 18  ALKPHOS 37*  BILITOT 0.5  PROT 6.0*  ALBUMIN 3.4*    Coagulation Profile: Recent Labs  Lab 03/25/24 0626  INR 1.1     Radiology Studies: No results found.    LOS: 5 days   Vanessa Boyd [employer] on [email or web address]  03/30/2024, 10:54 AM

## 2024-03-30 NOTE — Progress Notes (Addendum)
 Daily Progress Note  DOA: 03/25/2024 Hospital Day: 6   Cc:   lower GI bleed  Brief History:  83 y.o. year old female with a medical history including but not limited to HTN, HLD, hyperthyroidism. diverticulosis Admitted with lower GI bleed, see 03/25/2024 GI consult note  ASSESSMENT    Recurrent lower GI bleed, lively diverticular hemorrhage Colonoscopy for the same in Oct 2023 -- difficult procedure and prep inadequate but there was sigmoid diverticulosis. CT angio this admission negative for active bleeding, possible mild wall thickening in distal colon.  Today: Had two small BMs this am. I was able to see them and stools dark with surrounding small amount of red blood  Acute blood loss anemia 2/2 to above Hgb improved after 1 u RBCs Today:  Hgb 9.2   Principal Problem:   GIB (gastrointestinal bleeding) Active Problems:   Lower GI bleed   Acute post-hemorrhagic anemia   Diverticulosis   Diverticulosis of colon with hemorrhage   PLAN   --Can she ambulate with assistance in hall?  --am CBC --Would montior one more night to make sure no active bleeding.  If rebleeds this admission obtain stat CTA and if positive then to IR for angiography and embolization.    Subjective   Eating okay. Two small BMs this am. Feels weak because she hasn't been ambulating. Wants to ambulate in halls and go home when possible.    Objective    Recent Labs    03/29/24 0713 03/29/24 1212 03/30/24 0521  WBC 8.1 9.4 8.0  HGB 9.6* 9.6* 9.2*  HCT 29.0* 28.9* 28.5*  MCV 95.1 95.1 96.6  PLT 271 278 295   Recent Labs    03/28/24 0606  FERRITIN 25  TIBC 350  IRONPCTSAT 7*   Recent Labs    03/28/24 0606 03/29/24 0713  NA 140 138  K 3.9 3.6  CL 110 107  CO2 26 27  GLUCOSE 112* 113*  BUN 8 8  CREATININE 0.61 0.60  CALCIUM  8.8* 8.9   Imaging:  CT ANGIO GI BLEED CLINICAL DATA:  Active GI bleed  EXAM: CTA ABDOMEN AND PELVIS WITHOUT AND WITH  CONTRAST  TECHNIQUE: Multidetector CT imaging of the abdomen and pelvis was performed using the standard protocol during bolus administration of intravenous contrast. Multiplanar reconstructed images and MIPs were obtained and reviewed to evaluate the vascular anatomy.  RADIATION DOSE REDUCTION: This exam was performed according to the departmental dose-optimization program which includes automated exposure control, adjustment of the mA and/or kV according to patient size and/or use of iterative reconstruction technique.  CONTRAST:  75mL OMNIPAQUE  IOHEXOL  350 MG/ML SOLN  COMPARISON:  None Available.  FINDINGS: VASCULAR  Aorta: Patent with mild atherosclerotic changes.  Celiac: Patent.  SMA: Patent.  Renals: Patent.  IMA: Patent.  Inflow: Mild atherosclerotic changes, patent.  Proximal Outflow: Patent.  Veins: No obvious venous abnormality within the limitations of this arterial phase study.  Review of the MIP images confirms the above findings.  NON-VASCULAR  Lower chest: Hiatal hernia.  Hepatobiliary: No focal liver lesion. No intrahepatic biliary ductal dilatation. Gallbladder is grossly unremarkable.  Pancreas: Unremarkable. No pancreatic ductal dilatation or surrounding inflammatory changes.  Spleen: Normal in size without focal abnormality.  Adrenals/Urinary Tract: Adrenal glands are unremarkable. Kidneys are normal, without renal calculi, focal lesion, or hydronephrosis. Bladder is unremarkable.  Stomach/Bowel: Hiatal hernia. No dilated loops of bowel are appreciated. Extensive diverticular changes are present in the colon. This is worst in the distal  in sigmoid colon. The colon in this area is decompressed which limits evaluation for wall thickening.  Lymphatic: No significant lymphadenopathy.  Reproductive: Status post hysterectomy. No adnexal masses.  Other: Nothing significant.  Musculoskeletal: Degenerative changes in the imaged  osseous structures. Postsurgical changes from posterior spinal fusion of the lumbar spine.  IMPRESSION: 1. No evidence of active arterial extravasation into the GI tract. 2. Extensive colonic diverticulosis is present. There is limited evaluation of the distal colon which may demonstrate mild wall thickening and the thus possibly sequelae of underlying inflammatory change. Correlate with clinical symptoms and consider endoscopic evaluation if clinically appropriate.  Electronically Signed   By: Reagan Camera M.D.   On: 03/25/2024 07:26 CT Cervical Spine Wo Contrast EXAM: CT CERVICAL SPINE WITHOUT CONTRAST 03/25/2024 07:10:10 AM  TECHNIQUE: CT of the cervical was performed without the administration of intravenous contrast. Multiplanar reformatted images are provided for review. Automated exposure control, iterative reconstruction, and/or weight based adjustment of the mA/kV was utilized to reduce the radiation dose to as low as reasonably achievable.  COMPARISON: 10/17/2016  CLINICAL HISTORY: Neck trauma (Age >= 65y). fall  FINDINGS:  CERVICAL SPINE:  BONES/ALIGNMENT: No acute fractures are present. 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  DEGENERATIVE CHANGES: Multilevel endplate degenerative changes demonstrate some progression since the prior study. Osseous foraminal narrowing is present on the left at C2-3 and is worse left than right at C3-4. Asymmetric right foraminal narrowing is present at C4-5.  SOFT TISSUES: Atherosclerotic changes are present at the carotid bifurcations bilaterally as well as the cavernous internal carotid arteries. Calcified right thyroid  nodule is stable. A heterogeneous left thyroid  nodule measures 19 mm, also stable. These lesions are not incidental given previous thyroid  ablation, no follow-up is recommended.  IMPRESSION: 1. No acute fractures. 2. Multilevel endplate degenerative changes with some progression since the prior  study. 3. Osseous foraminal narrowing on the left at C2-3 and worse left than right at C3-4. Asymmetric right foraminal narrowing at C4-5. 4. Congenital fusion at C2-3.  Electronically signed by: Audree Leas MD 03/25/2024 07:27 AM EDT RP Workstation: [id number] CT Maxillofacial Wo Contrast EXAM: CT OF THE FACE WITHOUT CONTRAST 03/25/2024 07:10:10 AM  TECHNIQUE: CT of the face was performed without the administration of intravenous contrast. Multiplanar reformatted images are provided for review. Automated exposure control, iterative reconstruction, and/or weight based adjustment of the mA/kV was utilized to reduce the radiation dose to as low as reasonably achievable.  COMPARISON: CT head without contrast 10/17/2016.  CLINICAL HISTORY: Facial trauma, blunt. fall  FINDINGS:  FACIAL BONES: The maxilla, pterygoid plates and zygomatic arches are intact. The mandible is intact. The mandibular condyles are normally situated. The nasal bones and maxillary nasal processes are intact. No underlying fracture is present.  ORBITS: Bilateral lens replacements are noted. The globes and orbits are otherwise within normal limits.  SINUSES AND MASTOIDS: The paranasal sinuses and mastoid air cells are well aerated. No acute fracture is seen.  SOFT TISSUES: A left supraorbital scalp laceration and hematoma are present. No underlying fracture or foreign body is present. No other focal soft tissue injury is present.  VASCULATURE: Atherosclerotic calcifications are present in the cavernous carotid arteries bilaterally and at the dural margin of both vertebral arteries. No hyperdense vessel is present.  IMPRESSION: 1. Left supraorbital scalp laceration and hematoma without underlying fracture or foreign body. No other focal soft tissue injury.  Electronically signed by: Audree Leas MD 03/25/2024 07:21 AM EDT RP Workstation: [id number] CT Head Wo Contrast  EXAM: CT  HEAD WITHOUT 03/25/2024 07:10:10 AM  TECHNIQUE: CT of the head was performed without the administration of intravenous contrast. Automated exposure control, iterative reconstruction, and/or weight based adjustment of the mA/kV was utilized to reduce the radiation dose to as low as reasonably achievable.  COMPARISON: None available.  CLINICAL HISTORY: Head trauma, minor (Age >= 65y). fall  FINDINGS:  BRAIN AND VENTRICLES: There is no acute intracranial hemorrhage, mass effect or midline shift. No abnormal extra-axial fluid collection. The gray-white differentiation is maintained without an acute infarct. There is no hydrocephalus. Atherosclerotic calcifications are present in the cavernous carotid arteries bilaterally and at the dural margin of both vertebral arteries. No hyperdense vessel is present.  ORBITS: The visualized portion of the orbits demonstrate no acute abnormality.  SINUSES: The visualized paranasal sinuses and mastoid air cells demonstrate no acute abnormality.  SOFT TISSUES AND SKULL: Left supraorbital scalp laceration and hematoma are present. No underlying fracture or foreign body is present.  IMPRESSION: 1. No acute intracranial abnormality. 2. Left supraorbital scalp laceration and hematoma without underlying fracture or foreign body.  Electronically signed by: Audree Leas MD 03/25/2024 07:18 AM EDT RP Workstation: [id number]   Scheduled inpatient medications:   sodium chloride    Intravenous Once   ascorbic acid   500 mg Oral Daily   cholecalciferol   2,000 Units Oral Daily   ferrous sulfate   325 mg Oral Q breakfast   gabapentin   600 mg Oral Daily   simvastatin   40 mg Oral QHS   Continuous inpatient infusions:  PRN inpatient medications: acetaminophen , ALPRAZolam   Vital signs in last 24 hours: Temp:  [97.8 F (36.6 C)-98.3 F (36.8 C)] 98.2 F (36.8 C) (06/09 0853) Pulse Rate:  [79-92] 92 (06/09 0853) Resp:  [16-19] 19 (06/09  0853) BP: (123-139)/(66-74) 123/68 (06/09 0853) SpO2:  [96 %-98 %] 98 % (06/09 0853) Last BM Date : 03/29/24  Intake/Output Summary (Last 24 hours) at 03/30/2024 0958 Last data filed at 03/30/2024 0949 Gross per 24 hour  Intake 178 ml  Output 0 ml  Net 178 ml    Intake/Output from previous day: 06/08 0701 - 06/09 0700 In: 296 [P.O.:296] Out: -  Intake/Output this shift: No intake/output data recorded.   Physical Exam:  General: Alert female in NAD Heart:  Regular rate  Pulmonary: Normal respiratory effort Abdomen: Soft, nondistended, nontender. Normal bowel sounds. Extremities: No lower extremity edema  Neurologic: Alert and oriented Psych: Pleasant. Cooperative     LOS: 5 days   Mai Schwalbe ,NP 03/30/2024, 9:58 AM    Attending physician's note   I personally saw the patient and performed a substantive portion of this encounter (>50% time spent), including a complete performance of at least one of the key components (MDM, Hx and/or Exam), in conjunction with the APP.  I agree with the APP's note, impression, and recommendations with additional input as follows.     Current lower GI bleed likely diverticular hemorrhage, passing old blood.  Hemoglobin stable Continue diet as tolerated Patient may be able to go home tentatively tomorrow if no further bleeding and hemoglobin remains stable   The patient was provided an opportunity to ask questions and all were answered. The patient agreed with the plan and demonstrated an understanding of the instructions.   Lorena Rolling , MD [phone]

## 2024-03-30 NOTE — Plan of Care (Signed)
   Problem: Education: Goal: Knowledge of General Education information will improve Description: Including pain rating scale, medication(s)/side effects and non-pharmacologic comfort measures Outcome: Completed/Met

## 2024-03-31 DIAGNOSIS — K5791 Diverticulosis of intestine, part unspecified, without perforation or abscess with bleeding: Secondary | ICD-10-CM | POA: Diagnosis not present

## 2024-03-31 LAB — BASIC METABOLIC PANEL WITH GFR
Anion gap: 5 (ref 5–15)
BUN: 13 mg/dL (ref 8–23)
CO2: 26 mmol/L (ref 22–32)
Calcium: 8.7 mg/dL — ABNORMAL LOW (ref 8.9–10.3)
Chloride: 107 mmol/L (ref 98–111)
Creatinine, Ser: 0.7 mg/dL (ref 0.44–1.00)
GFR, Estimated: >60 mL/min (ref >60)
Glucose, Bld: 106 mg/dL — ABNORMAL HIGH (ref 70–99)
Potassium: 4 mmol/L (ref 3.5–5.1)
Sodium: 138 mmol/L (ref 135–145)

## 2024-03-31 LAB — CBC
HCT: 28.5 % — ABNORMAL LOW (ref 36.0–46.0)
Hemoglobin: 9.1 g/dL — ABNORMAL LOW (ref 12.0–15.0)
MCH: 31.2 pg (ref 26.0–34.0)
MCHC: 31.9 g/dL (ref 30.0–36.0)
MCV: 97.6 fL (ref 80.0–100.0)
Platelets: 296 10*3/uL (ref 150–400)
RBC: 2.92 MIL/uL — ABNORMAL LOW (ref 3.87–5.11)
RDW: 15.9 % — ABNORMAL HIGH (ref 11.5–15.5)
WBC: 7.1 10*3/uL (ref 4.0–10.5)
nRBC: 0 % (ref 0.0–0.2)

## 2024-03-31 MED ORDER — FERROUS SULFATE 325 (65 FE) MG PO TABS: 325.0000 mg | ORAL_TABLET | Freq: Every day | ORAL | 2 refills | Status: AC

## 2024-03-31 NOTE — Evaluation (Signed)
 Occupational Therapy Evaluation Patient Details Name: Vanessa Boyd MRN: [id number] DOB: [birth year] Today's Date: 03/31/2024   History of Present Illness   Pt is a 83 yr old female who presented 03/25/24 from home due to a fall and bloody BMs. CT negative for fxs. Pt has had PRBC transfusions. PMH: HTN, HLD, hyperthyroidism, osteoporosis p/w BRBPR, L 4-5 PLIF     Clinical Impressions Pt reported at PLOF they live in ILF setting for about 4 weeks and did not use DME for ambulation. At this time she was able to complete ADL tasks mod I to independent and just reported feeling a little weak with being in the hospital. She was able to ambulate with no assist and no LOB. At this time recommendation for follow up with OP PT (she has within ILF). Acute Occupational Therapy singing off. Thank you.      If plan is discharge home, recommend the following:   Assist for transportation     Functional Status Assessment   Patient has had a recent decline in their functional status and demonstrates the ability to make significant improvements in function in a reasonable and predictable amount of time.     Equipment Recommendations   None recommended by OT     Recommendations for Other Services         Precautions/Restrictions   Precautions Precautions: Fall Recall of Precautions/Restrictions: Intact Precaution/Restrictions Comments: Pt fell in blood from BM Restrictions Weight Bearing Restrictions Per Provider Order: No     Mobility Bed Mobility Overal bed mobility: Modified Independent                  Transfers Overall transfer level: Independent                        Balance Overall balance assessment: Modified Independent                                         ADL either performed or assessed with clinical judgement   ADL Overall ADL's : Needs assistance/impaired Eating/Feeding: Independent;Sitting   Grooming: Wash/dry  face;Independent;Standing   Upper Body Bathing: Modified independent;Sitting   Lower Body Bathing: Modified independent;Sit to/from stand   Upper Body Dressing : Modified independent;Sitting;Standing   Lower Body Dressing: Modified independent;Sit to/from stand   Toilet Transfer: Independent   Toileting- Architect and Hygiene: Independent;Sit to/from stand   Tub/ Shower Transfer: Walk-in shower;Cueing for safety;Cueing for sequencing;Supervision/safety   Functional mobility during ADLs: Independent       Vision Baseline Vision/History: 0 No visual deficits Ability to See in Adequate Light: 0 Adequate Patient Visual Report: No change from baseline Vision Assessment?: No apparent visual deficits     Perception Perception: Within Functional Limits       Praxis Praxis: WFL       Pertinent Vitals/Pain Pain Assessment Pain Assessment: Faces Faces Pain Scale: Hurts a little bit Pain Location: general soreness Pain Descriptors / Indicators: Discomfort Pain Intervention(s): Limited activity within patient's tolerance, Monitored during session     Extremity/Trunk Assessment Upper Extremity Assessment Upper Extremity Assessment: Right hand dominant;Generalized weakness;LUE deficits/detail LUE Deficits / Details: Pt reported they had some prior weakness on LUE and was going to see md about it and now with the fall increase in weakness but has WFL AROM to complete ADLS LUE Sensation: WNL LUE Coordination: WNL  Lower Extremity Assessment Lower Extremity Assessment: Defer to PT evaluation   Cervical / Trunk Assessment Cervical / Trunk Assessment: Normal   Communication Communication Communication: No apparent difficulties   Cognition Arousal: Alert Behavior During Therapy: WFL for tasks assessed/performed Cognition: No apparent impairments                               Following commands: Intact       Cueing  General Comments   Cueing  Techniques: Verbal cues  Pt able to grab item off floor, look side to side no LOB   Exercises     Shoulder Instructions      Home Living Family/patient expects to be discharged to:: Private residence Living Arrangements: Alone   Type of Home: Independent living facility Home Access: Level entry     Home Layout: One level     Bathroom Shower/Tub: Producer, television/film/video: Standard Bathroom Accessibility: Yes   Home Equipment: Shower seat - built in;Grab bars - tub/shower          Prior Functioning/Environment Prior Level of Function : Independent/Modified Independent             Mobility Comments: no DME      OT Problem List: Decreased strength   OT Treatment/Interventions:        OT Goals(Current goals can be found in the care plan section)   Acute Rehab OT Goals Patient Stated Goal: to go home OT Goal Formulation: With patient Time For Goal Achievement: 04/14/24 Potential to Achieve Goals: Good   OT Frequency:       Co-evaluation              AM-PAC OT "6 Clicks" Daily Activity     Outcome Measure Help from another person eating meals?: None Help from another person taking care of personal grooming?: None Help from another person toileting, which includes using toliet, bedpan, or urinal?: None Help from another person bathing (including washing, rinsing, drying)?: None Help from another person to put on and taking off regular upper body clothing?: None Help from another person to put on and taking off regular lower body clothing?: None 6 Click Score: 24   End of Session Equipment Utilized During Treatment: Gait belt Nurse Communication: Mobility status  Activity Tolerance: Patient tolerated treatment well Patient left: in chair;with call bell/phone within reach  OT Visit Diagnosis: Muscle weakness (generalized) (M62.81)                Time: 1610-9604 OT Time Calculation (min): 26 min Charges:  OT General Charges $OT Visit:  1 Visit OT Evaluation $OT Eval Low Complexity: 1 Low OT Treatments $Self Care/Home Management : 8-22 mins  Vanessa Boyd OTR/L  Acute Rehab Services  [phone] office number   Stevphen Elders 03/31/2024, 9:02 AM

## 2024-03-31 NOTE — Progress Notes (Signed)
 DISCHARGE NOTE HOME Vanessa Boyd to be discharged Home per MD order. Discussed prescriptions and follow up appointments with the patient. Prescriptions given to patient; medication list explained in detail. Patient verbalized understanding.  Skin clean, dry and intact without evidence of skin break down, no evidence of skin tears noted. IV catheter discontinued intact. Site without signs and symptoms of complications. Dressing and pressure applied. Pt denies pain at the site currently. No complaints noted.  Patient free of lines, drains, and wounds.   An After Visit Summary (AVS) was printed and given to the patient. Taken to DC lounge to wait for ride Patient will be escorted via wheelchair, and discharged home via private auto.  Tonda Francisco, RN

## 2024-03-31 NOTE — TOC Transition Note (Signed)
 Transition of Care [facility]) - Discharge Note   Patient Details  Name: Vanessa Boyd MRN: [id number] Date of Birth: [birth year]  Transition of Care [facility]) CM/SW Contact:  Tom-Johnson, Angelique Ken, RN Phone Number: 03/31/2024, 9:58 AM   Clinical Narrative:     Patient is scheduled for discharge today.  Readmission Risk Assessment done. Outpatient PT/OT referral, hospital f/u and discharge instructions on AVS. Friend, Geri to transport at discharge.  No further TOC needs noted.      Final next level of care: OP Rehab (Therapy at [facility] with [facility]) Barriers to Discharge: Barriers Resolved   Patient Goals and CMS Choice Patient states their goals for this hospitalization and ongoing recovery are:: To return to ILF CMS Medicare.gov Compare Post Acute Care list provided to:: Patient Choice offered to / list presented to : Patient      Discharge Placement                Patient to be transferred to facility by: Friend Name of family member notified: Geri    Discharge Plan and Services Additional resources added to the After Visit Summary for                  DME Arranged: N/A DME Agency: NA       HH Arranged: NA HH Agency: NA        Social Drivers of Health (SDOH) Interventions SDOH Screenings   Food Insecurity: No Food Insecurity (03/25/2024)  Housing: Low Risk  (03/25/2024)  Transportation Needs: No Transportation Needs (03/25/2024)  Utilities: Not At Risk (03/25/2024)  Social Connections: Patient Declined (03/26/2024)  Tobacco Use: Medium Risk (03/25/2024)     Readmission Risk Interventions    03/26/2024    4:08 PM  Readmission Risk Prevention Plan  Post Dischage Appt Complete  Medication Screening Complete  Transportation Screening Complete

## 2024-03-31 NOTE — Discharge Summary (Signed)
 [facility]  Physician Discharge Summary   Patient ID: Vanessa Boyd MRN: [id number] DOB/AGE: Sep 12, 1941 83 y.o.  Admit date: 03/25/2024 Discharge date: 03/31/2024    PCP: Bertha Broad, MD  DISCHARGE DIAGNOSES:    Lower GI bleed   Acute post-hemorrhagic anemia   Diverticulosis of colon with hemorrhage   RECOMMENDATIONS FOR OUTPATIENT FOLLOW UP: Follow-up with PCP for evaluation of the scalp laceration   Home Health: Outpatient PT and OT Equipment/Devices: None  CODE STATUS: Full code  DISCHARGE CONDITION: fair  Diet recommendation: As before  INITIAL HISTORY: 83 y.o. female with medical history significant of HTN, HLD, hyperthyroidism, osteoporosis p/w BRBPR for a day.  She was hospitalized for further management.  She also experienced a mechanical fall at home hitting her head against the floor which resulted in a laceration.  This was sutured in the emergency department.   Consultants: Gastroenterology   Procedures: Suture of scalp laceration  HOSPITAL COURSE:   Hematochezia/acute blood loss anemia Most likely diverticular bleed.  Underwent CT angiogram at admission which did not show any active bleed. Required blood transfusion in the hospital.  Hemoglobin has stabilized.  Bleeding appears to have subsided.  Cleared by GI for discharge.   Essential hypertension   Osteoporosis  Noted.  On Prolia     GERD  Continue prilosec    T2DM HbA1c 5.3.  Holding metformin  and Ozempic.     HLD Noted Continue statin    H/o Hyperthryoidism Last TSH ~1 y ago 2.583 TSH normal    H/o Tremors  On PRN propranolol for this   Scalp laceration It appears that self absorbing sutures were utilized.  Follow-up with PCP.  Patient is stable.  Okay for discharge home today.   PERTINENT LABS:  The results of significant diagnostics from this hospitalization (including imaging, microbiology, ancillary and laboratory) are listed below for reference.      Labs:   Basic Metabolic Panel: Recent Labs  Lab 03/26/24 0458 03/27/24 0442 03/28/24 0606 03/29/24 0713 03/31/24 0501  NA 138 138 140 138 138  K 4.1 4.3 3.9 3.6 4.0  CL 108 109 110 107 107  CO2 24 23 26 27 26   GLUCOSE 115* 116* 112* 113* 106*  BUN 14 11 8 8 13   CREATININE 0.71 0.78 0.61 0.60 0.70  CALCIUM  8.1* 8.2* 8.8* 8.9 8.7*   Liver Function Tests: Recent Labs  Lab 03/25/24 0605  AST 24  ALT 18  ALKPHOS 37*  BILITOT 0.5  PROT 6.0*  ALBUMIN 3.4*    CBC: Recent Labs  Lab 03/25/24 0605 03/25/24 0609 03/28/24 0606 03/29/24 0713 03/29/24 1212 03/30/24 0521 03/31/24 0501  WBC 13.7*   < > 6.9 8.1 9.4 8.0 7.1  NEUTROABS 10.8*  --   --   --   --   --   --   HGB 10.5*   < > 8.5* 9.6* 9.6* 9.2* 9.1*  HCT 31.8*   < > 25.1* 29.0* 28.9* 28.5* 28.5*  MCV 98.5   < > 93.0 95.1 95.1 96.6 97.6  PLT 228   < > 190 271 278 295 296   < > = values in this interval not displayed.     IMAGING STUDIES CT Cervical Spine Wo Contrast Result Date: 03/25/2024 EXAM: CT CERVICAL SPINE WITHOUT CONTRAST 03/25/2024 07:10:10 AM TECHNIQUE: CT of the cervical was performed without the administration of intravenous contrast. Multiplanar reformatted images are provided for review. Automated exposure control, iterative reconstruction, and/or weight based adjustment of the mA/kV  was utilized to reduce the radiation dose to as low as reasonably achievable. COMPARISON: 10/17/2016 CLINICAL HISTORY: Neck trauma (Age >= 65y). fall FINDINGS: CERVICAL SPINE: BONES/ALIGNMENT: No acute fractures are present. 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 DEGENERATIVE CHANGES: Multilevel endplate degenerative changes demonstrate some progression since the prior study. Osseous foraminal narrowing is present on the left at C2-3 and is worse left than right at C3-4. Asymmetric right foraminal narrowing is present at C4-5. SOFT TISSUES: Atherosclerotic changes are present at the carotid bifurcations bilaterally as well  as the cavernous internal carotid arteries. Calcified right thyroid  nodule is stable. A heterogeneous left thyroid  nodule measures 19 mm, also stable. These lesions are not incidental given previous thyroid  ablation, no follow-up is recommended. IMPRESSION: 1. No acute fractures. 2. Multilevel endplate degenerative changes with some progression since the prior study. 3. Osseous foraminal narrowing on the left at C2-3 and worse left than right at C3-4. Asymmetric right foraminal narrowing at C4-5. 4. Congenital fusion at C2-3. Electronically signed by: Audree Leas MD 03/25/2024 07:27 AM EDT RP Workstation: [id number]   CT ANGIO GI BLEED Result Date: 03/25/2024 CLINICAL DATA:  Active GI bleed EXAM: CTA ABDOMEN AND PELVIS WITHOUT AND WITH CONTRAST TECHNIQUE: Multidetector CT imaging of the abdomen and pelvis was performed using the standard protocol during bolus administration of intravenous contrast. Multiplanar reconstructed images and MIPs were obtained and reviewed to evaluate the vascular anatomy. RADIATION DOSE REDUCTION: This exam was performed according to the departmental dose-optimization program which includes automated exposure control, adjustment of the mA and/or kV according to patient size and/or use of iterative reconstruction technique. CONTRAST:  75mL OMNIPAQUE  IOHEXOL  350 MG/ML SOLN COMPARISON:  None Available. FINDINGS: VASCULAR Aorta: Patent with mild atherosclerotic changes. Celiac: Patent. SMA: Patent. Renals: Patent. IMA: Patent. Inflow: Mild atherosclerotic changes, patent. Proximal Outflow: Patent. Veins: No obvious venous abnormality within the limitations of this arterial phase study. Review of the MIP images confirms the above findings. NON-VASCULAR Lower chest: Hiatal hernia. Hepatobiliary: No focal liver lesion. No intrahepatic biliary ductal dilatation. Gallbladder is grossly unremarkable. Pancreas: Unremarkable. No pancreatic ductal dilatation or surrounding inflammatory  changes. Spleen: Normal in size without focal abnormality. Adrenals/Urinary Tract: Adrenal glands are unremarkable. Kidneys are normal, without renal calculi, focal lesion, or hydronephrosis. Bladder is unremarkable. Stomach/Bowel: Hiatal hernia. No dilated loops of bowel are appreciated. Extensive diverticular changes are present in the colon. This is worst in the distal in sigmoid colon. The colon in this area is decompressed which limits evaluation for wall thickening. Lymphatic: No significant lymphadenopathy. Reproductive: Status post hysterectomy. No adnexal masses. Other: Nothing significant. Musculoskeletal: Degenerative changes in the imaged osseous structures. Postsurgical changes from posterior spinal fusion of the lumbar spine. IMPRESSION: 1. No evidence of active arterial extravasation into the GI tract. 2. Extensive colonic diverticulosis is present. There is limited evaluation of the distal colon which may demonstrate mild wall thickening and the thus possibly sequelae of underlying inflammatory change. Correlate with clinical symptoms and consider endoscopic evaluation if clinically appropriate. Electronically Signed   By: Reagan Camera M.D.   On: 03/25/2024 07:26   CT Maxillofacial Wo Contrast Result Date: 03/25/2024 EXAM: CT OF THE FACE WITHOUT CONTRAST 03/25/2024 07:10:10 AM TECHNIQUE: CT of the face was performed without the administration of intravenous contrast. Multiplanar reformatted images are provided for review. Automated exposure control, iterative reconstruction, and/or weight based adjustment of the mA/kV was utilized to reduce the radiation dose to as low as reasonably achievable. COMPARISON: CT head without contrast 10/17/2016. CLINICAL HISTORY:  Facial trauma, blunt. fall FINDINGS: FACIAL BONES: The maxilla, pterygoid plates and zygomatic arches are intact. The mandible is intact. The mandibular condyles are normally situated. The nasal bones and maxillary nasal processes are intact.  No underlying fracture is present. ORBITS: Bilateral lens replacements are noted. The globes and orbits are otherwise within normal limits. SINUSES AND MASTOIDS: The paranasal sinuses and mastoid air cells are well aerated. No acute fracture is seen. SOFT TISSUES: A left supraorbital scalp laceration and hematoma are present. No underlying fracture or foreign body is present. No other focal soft tissue injury is present. VASCULATURE: Atherosclerotic calcifications are present in the cavernous carotid arteries bilaterally and at the dural margin of both vertebral arteries. No hyperdense vessel is present. IMPRESSION: 1. Left supraorbital scalp laceration and hematoma without underlying fracture or foreign body. No other focal soft tissue injury. Electronically signed by: Audree Leas MD 03/25/2024 07:21 AM EDT RP Workstation: [id number]   CT Head Wo Contrast Result Date: 03/25/2024 EXAM: CT HEAD WITHOUT 03/25/2024 07:10:10 AM TECHNIQUE: CT of the head was performed without the administration of intravenous contrast. Automated exposure control, iterative reconstruction, and/or weight based adjustment of the mA/kV was utilized to reduce the radiation dose to as low as reasonably achievable. COMPARISON: None available. CLINICAL HISTORY: Head trauma, minor (Age >= 65y). fall FINDINGS: BRAIN AND VENTRICLES: There is no acute intracranial hemorrhage, mass effect or midline shift. No abnormal extra-axial fluid collection. The gray-white differentiation is maintained without an acute infarct. There is no hydrocephalus. Atherosclerotic calcifications are present in the cavernous carotid arteries bilaterally and at the dural margin of both vertebral arteries. No hyperdense vessel is present. ORBITS: The visualized portion of the orbits demonstrate no acute abnormality. SINUSES: The visualized paranasal sinuses and mastoid air cells demonstrate no acute abnormality. SOFT TISSUES AND SKULL: Left supraorbital scalp  laceration and hematoma are present. No underlying fracture or foreign body is present. IMPRESSION: 1. No acute intracranial abnormality. 2. Left supraorbital scalp laceration and hematoma without underlying fracture or foreign body. Electronically signed by: Audree Leas MD 03/25/2024 07:18 AM EDT RP Workstation: [id number]    DISCHARGE EXAMINATION: Vitals:   03/30/24 1615 03/30/24 2014 03/31/24 0539 03/31/24 0724  BP: (!) 125/59 130/64 115/60 (!) 137/49  Pulse: 78 96 70 71  Resp: 18 20 18 19   Temp: 98 F (36.7 C) 98.1 F (36.7 C) 98.3 F (36.8 C) 98.3 F (36.8 C)  TempSrc: Oral Oral Oral   SpO2: 100% 97% 97% 97%  Weight:      Height:       General appearance: Awake alert.  In no distress Resp: Clear to auscultation bilaterally.  Normal effort Cardio: S1-S2 is normal regular.  No S3-S4.  No rubs murmurs or bruit GI: Abdomen is soft.  Nontender nondistended.  Bowel sounds are present normal.  No masses organomegaly   DISPOSITION: Home  Discharge Instructions     Ambulatory referral to Occupational Therapy   Complete by: As directed    [facility]   Ambulatory referral to Physical Therapy   Complete by: As directed    [facility]   Call MD for:  difficulty breathing, headache or visual disturbances   Complete by: As directed    Call MD for:  extreme fatigue   Complete by: As directed    Call MD for:  persistant dizziness or light-headedness   Complete by: As directed    Call MD for:  persistant nausea and vomiting  Complete by: As directed    Call MD for:  severe uncontrolled pain   Complete by: As directed    Call MD for:  temperature >100.4   Complete by: As directed    Diet - low sodium heart healthy   Complete by: As directed    Discharge instructions   Complete by: As directed    Please be sure to follow-up with your primary care provider in 1 week.  Seek attention if bleeding recurs.  You were cared for by a  hospitalist during your hospital stay. If you have any questions about your discharge medications or the care you received while you were in the hospital after you are discharged, you can call the unit and asked to speak with the hospitalist on call if the hospitalist that took care of you is not available. Once you are discharged, your primary care physician will handle any further medical issues. Please note that NO REFILLS for any discharge medications will be authorized once you are discharged, as it is imperative that you return to your primary care physician (or establish a relationship with a primary care physician if you do not have one) for your aftercare needs so that they can reassess your need for medications and monitor your lab values. If you do not have a primary care physician, you can call [phone] for a physician referral.   Increase activity slowly   Complete by: As directed    No wound care   Complete by: As directed          Allergies as of 03/31/2024       Reactions   5-alpha Reductase Inhibitors Other (See Comments)   Unknown reaction   Codeine    REACTION: nausea/vomiting   Hydromorphone  Other (See Comments)   Hallucinations        Medication List     TAKE these medications    ALPRAZolam  0.25 MG tablet Commonly known as: XANAX  Take 0.25 mg by mouth 2 (two) times daily as needed.   ascorbic acid  500 MG tablet Commonly known as: VITAMIN C  Take 500 mg by mouth daily.   CENTRUM SILVER PO Take 1 tablet by mouth daily.   denosumab  60 MG/ML Soln injection Commonly known as: PROLIA  Inject 60 mg into the skin every 6 (six) months. Administer in upper arm, thigh, or abdomen   ferrous sulfate  325 (65 FE) MG tablet Take 1 tablet (325 mg total) by mouth daily with breakfast. Start taking on: April 01, 2024   gabapentin  600 MG tablet Commonly known as: NEURONTIN  Take 600 mg by mouth daily.   metFORMIN  500 MG 24 hr tablet Commonly known as:  GLUCOPHAGE -XR Take 500 mg by mouth daily with breakfast.   omeprazole  20 MG capsule Commonly known as: PRILOSEC TAKE 1 CAPSULE(20 MG) BY MOUTH DAILY   Ozempic (1 MG/DOSE) 4 MG/3ML Sopn Generic drug: Semaglutide (1 MG/DOSE) Inject 1 mg into the skin every Wednesday.   propranolol 10 MG tablet Commonly known as: INDERAL Take 10 mg by mouth daily as needed.   simvastatin  40 MG tablet Commonly known as: ZOCOR  Take 40 mg by mouth daily.   Vitamin D3 50 MCG (2000 UT) capsule Take 2,000 Units by mouth daily.          Follow-up Information     Bertha Broad, MD. Schedule an appointment as soon as possible for a visit in 1 week(s).   Specialty: Internal Medicine Why: post hospitalization follow up Contact information: [id number]  [address] [place] [address] [phone]         [facility] Follow up.   Why: Call to schedule first appointment. Contact information: [facility]  [phone]                TOTAL DISCHARGE TIME: 35 minutes  Vandora Jaskulski Lyndon Santiago  [facility] Pager on [email or web address]  03/31/2024, 11:07 AM

## 2024-03-31 NOTE — Progress Notes (Signed)
 PT Cancellation Note  Patient Details Name: Vanessa Boyd MRN: [id number] DOB: [birth year]   Cancelled Treatment:    Reason Eval/Treat Not Completed: PT screened, no needs identified, will sign off. Observed pt ambulating with OT without AD or assist. No LOB noted. Pt back to baseline. Pt with no acute PT needs.  Graceyn Fodor, PT, DPT Acute Rehabilitation Services Secure chat preferred Office #: [phone]    Jenna Moan 03/31/2024, 7:56 AM

## 2024-09-06 ENCOUNTER — Emergency Department (HOSPITAL_COMMUNITY)
Admission: EM | Admit: 2024-09-06 | Discharge: 2024-09-06 | Disposition: A | Discharge status: Home / Self Care | Attending: Emergency Medicine | Admitting: Emergency Medicine

## 2024-09-06 ENCOUNTER — Other Ambulatory Visit: Payer: Self-pay

## 2024-09-06 ENCOUNTER — Emergency Department (HOSPITAL_COMMUNITY)

## 2024-09-06 DIAGNOSIS — W0110XA Fall on same level from slipping, tripping and stumbling with subsequent striking against unspecified object, initial encounter: Secondary | ICD-10-CM | POA: Diagnosis not present

## 2024-09-06 DIAGNOSIS — F10120 Alcohol abuse with intoxication, uncomplicated: Secondary | ICD-10-CM | POA: Diagnosis present

## 2024-09-06 DIAGNOSIS — Y906 Blood alcohol level of 120-199 mg/100 ml: Secondary | ICD-10-CM | POA: Diagnosis not present

## 2024-09-06 DIAGNOSIS — S0990XA Unspecified injury of head, initial encounter: Secondary | ICD-10-CM | POA: Diagnosis present

## 2024-09-06 DIAGNOSIS — W19XXXA Unspecified fall, initial encounter: Secondary | ICD-10-CM

## 2024-09-06 DIAGNOSIS — Z79899 Other long term (current) drug therapy: Secondary | ICD-10-CM | POA: Diagnosis not present

## 2024-09-06 DIAGNOSIS — F10920 Alcohol use, unspecified with intoxication, uncomplicated: Secondary | ICD-10-CM

## 2024-09-06 DIAGNOSIS — H5702 Anisocoria: Secondary | ICD-10-CM | POA: Diagnosis present

## 2024-09-06 LAB — CBC WITH DIFFERENTIAL/PLATELET
Abs Immature Granulocytes: 0.02 K/uL (ref 0.00–0.07)
Basophils Absolute: 0 K/uL (ref 0.0–0.1)
Basophils Relative: 1 %
Eosinophils Absolute: 0.1 K/uL (ref 0.0–0.5)
Eosinophils Relative: 2 %
HCT: 38.9 % (ref 36.0–46.0)
Hemoglobin: 12.7 g/dL (ref 12.0–15.0)
Immature Granulocytes: 0 %
Lymphocytes Relative: 33 %
Lymphs Abs: 2.2 K/uL (ref 0.7–4.0)
MCH: 32.3 pg (ref 26.0–34.0)
MCHC: 32.6 g/dL (ref 30.0–36.0)
MCV: 99 fL (ref 80.0–100.0)
Monocytes Absolute: 0.5 K/uL (ref 0.1–1.0)
Monocytes Relative: 8 %
Neutro Abs: 3.7 K/uL (ref 1.7–7.7)
Neutrophils Relative %: 56 %
Platelets: 202 K/uL (ref 150–400)
RBC: 3.93 MIL/uL (ref 3.87–5.11)
RDW: 13.1 % (ref 11.5–15.5)
WBC: 6.6 K/uL (ref 4.0–10.5)
nRBC: 0 % (ref 0.0–0.2)

## 2024-09-06 LAB — BASIC METABOLIC PANEL WITH GFR
Anion gap: 13 (ref 5–15)
BUN: 12 mg/dL (ref 8–23)
CO2: 22 mmol/L (ref 22–32)
Calcium: 9.2 mg/dL (ref 8.9–10.3)
Chloride: 100 mmol/L (ref 98–111)
Creatinine, Ser: 0.62 mg/dL (ref 0.44–1.00)
GFR, Estimated: >60 mL/min (ref >60)
Glucose, Bld: 99 mg/dL (ref 70–99)
Potassium: 3.8 mmol/L (ref 3.5–5.1)
Sodium: 135 mmol/L (ref 135–145)

## 2024-09-06 LAB — CBG MONITORING, ED: Glucose-Capillary: 105 mg/dL — ABNORMAL HIGH (ref 70–99)

## 2024-09-06 LAB — ETHANOL: Alcohol, Ethyl (B): 179 mg/dL — ABNORMAL HIGH (ref <15)

## 2024-09-06 NOTE — ED Notes (Signed)
Patient ambulated in hallway independently with steady gait.

## 2024-09-06 NOTE — ED Triage Notes (Signed)
 Pt BIB EMS after falling on her way to the elevator after drinking at the bar since 1300. EMS noted unequal pupils, AOX2 and repetitive questioning. Unknown if she hit her head, no LOC but doesn't remember events. C collar in place.

## 2024-09-06 NOTE — ED Provider Notes (Signed)
  EMERGENCY DEPARTMENT AT [facility] Provider Note   CSN: [id number] Arrival date & time: 09/06/24  1745     Patient presents with: Fall and Alcohol Intoxication   Vanessa Boyd is a 83 y.o. female.    Fall  Alcohol Intoxication     83 year old female presenting to the emergency department with a chief complaint of fall in the setting of alcohol consumption.  The patient states that she was at a bar drinking too many glasses of wine when she lost her balance fell backwards and struck her head.  No loss of consciousness.  She denies any other injuries or complaints.  She was on her way to the elevator after having been drinking at a bar since 1300.  EMS had noticed unequal pupils and she was having repetitive questioning.  On arrival, the patient appears clinically sober, is GCS 15, ABC intact.  The patient arrives c-collar in place.  She denies any other injuries or complaints.  Prior to Admission medications   Medication Sig Start Date End Date Taking? Authorizing Provider  ALPRAZolam  (XANAX ) 0.25 MG tablet Take 0.25 mg by mouth 2 (two) times daily as needed.    [provider]  ascorbic acid  (VITAMIN C ) 500 MG tablet Take 500 mg by mouth daily.    [provider]  Cholecalciferol  (VITAMIN D3) 50 MCG (2000 UT) capsule Take 2,000 Units by mouth daily.     [provider]  denosumab  (PROLIA ) 60 MG/ML SOLN injection Inject 60 mg into the skin every 6 (six) months. Administer in upper arm, thigh, or abdomen    [provider]  ferrous sulfate  325 (65 FE) MG tablet Take 1 tablet (325 mg total) by mouth daily with breakfast. 04/01/24   Verdene Purchase, MD  gabapentin  (NEURONTIN ) 600 MG tablet Take 600 mg by mouth daily. 03/12/22   [provider]  metFORMIN  (GLUCOPHAGE -XR) 500 MG 24 hr tablet Take 500 mg by mouth daily with breakfast.     [provider]  Multiple Vitamins-Minerals (CENTRUM SILVER PO) Take 1 tablet  by mouth daily.      [provider]  omeprazole  (PRILOSEC) 20 MG capsule TAKE 1 CAPSULE(20 MG) BY MOUTH DAILY 02/24/24   Avram Lupita BRAVO, MD  OZEMPIC, 1 MG/DOSE, 4 MG/3ML SOPN Inject 1 mg into the skin every Wednesday. 01/07/24   [provider]  propranolol (INDERAL) 10 MG tablet Take 10 mg by mouth daily as needed. 02/24/24   [provider]  simvastatin  (ZOCOR ) 40 MG tablet Take 40 mg by mouth daily. 10/06/16   [provider]    Allergies: 5-alpha reductase inhibitors, Codeine, and Hydromorphone     Review of Systems  All other systems reviewed and are negative.   Updated Vital Signs BP (!) 151/82 (BP Location: Right Arm)   Pulse (!) 109   Temp 98.4 F (36.9 C) (Oral)   Resp 20   Ht 5' (1.524 m)   Wt 56.7 kg   SpO2 99%   BMI 24.41 kg/m   Physical Exam Vitals and nursing note reviewed.  Constitutional:      General: She is not in acute distress.    Appearance: She is well-developed.     Comments: GCS 15, ABC intact  HENT:     Head: Normocephalic and atraumatic.  Eyes:     Extraocular Movements: Extraocular movements intact.     Conjunctiva/sclera: Conjunctivae normal.     Pupils: Pupils are equal, round, and reactive to light.  Neck:     Comments: No midline tenderness to palpation of the cervical spine.  Range of motion intact Cardiovascular:     Rate and Rhythm: Normal rate and regular rhythm.     Heart sounds: No murmur heard. Pulmonary:     Effort: Pulmonary effort is normal. No respiratory distress.     Breath sounds: Normal breath sounds.  Chest:     Comments: Clavicles stable nontender to AP compression.  Chest wall stable and nontender to AP and lateral compression. Abdominal:     Palpations: Abdomen is soft.     Tenderness: There is no abdominal tenderness.     Comments: Pelvis stable to lateral compression  Musculoskeletal:     Cervical back: Neck supple.     Comments: No midline tenderness to palpation of the thoracic  or lumbar spine.  Extremities atraumatic with intact range of motion  Skin:    General: Skin is warm and dry.  Neurological:     Mental Status: She is alert.     Comments: Cranial nerves II through XII grossly intact.  Moving all 4 extremities spontaneously.  Sensation grossly intact all 4 extremities     (all labs ordered are listed, but only abnormal results are displayed) Labs Reviewed  ETHANOL - Abnormal; Notable for the following components:      Result Value   Alcohol, Ethyl (B) 179 (*)    All other components within normal limits  CBG MONITORING, ED - Abnormal; Notable for the following components:   Glucose-Capillary 105 (*)    All other components within normal limits  CBC WITH DIFFERENTIAL/PLATELET  BASIC METABOLIC PANEL WITH GFR  PROTIME-INR    EKG: EKG Interpretation Date/Time:  Sunday September 06 2024 17:58:04 EST Ventricular Rate:  104 PR Interval:  155 QRS Duration:  93 QT Interval:  318 QTC Calculation: 419 R Axis:   44  Text Interpretation: Sinus tachycardia Confirmed by Jerrol Agent ([id number]) on 09/06/2024 6:12:56 PM  Radiology: CT Cervical Spine Wo Contrast Result Date: 09/06/2024 CLINICAL DATA:  Status post fall. EXAM: CT CERVICAL SPINE WITHOUT CONTRAST TECHNIQUE: Multidetector CT imaging of the cervical spine was performed without intravenous contrast. Multiplanar CT image reconstructions were also generated. RADIATION DOSE REDUCTION: This exam was performed according to the departmental dose-optimization program which includes automated exposure control, adjustment of the mA and/or kV according to patient size and/or use of iterative reconstruction technique. COMPARISON:  March 25, 2024 FINDINGS: Alignment: Normal. Skull base and vertebrae: No acute fracture. No primary bone lesion or focal pathologic process. Soft tissues and spinal canal: No prevertebral fluid or swelling. No visible canal hematoma. Disc levels: Marked severity multilevel endplate sclerosis,  anterior osteophyte formation and posterior bony spurring are seen at the levels of C2-C3, C3-C4, C4-C5, C5-C6 and C6-C7. There is marked severity intervertebral disc space narrowing at C2-C3, C3-C4, C4-C5, C5-C6 and C6-C7. Marked severity bilateral multilevel facet joint hypertrophy is noted, left greater than right. Upper chest: Negative. Other: Multiple stable, partially calcified and noncalcified heterogeneous thyroid  nodules are seen within the right and left lobes of the thyroid  gland. IMPRESSION: 1. No acute fracture or subluxation of the cervical spine. 2. Marked severity multilevel degenerative changes, as described above. Electronically Signed   By: Suzen Dials M.D.   On: 09/06/2024 19:33   CT Head Wo Contrast Result Date: 09/06/2024 CLINICAL DATA:  Status post fall. EXAM: CT HEAD WITHOUT CONTRAST TECHNIQUE: Contiguous axial images were obtained from the base of the skull through the vertex without  intravenous contrast. RADIATION DOSE REDUCTION: This exam was performed according to the departmental dose-optimization program which includes automated exposure control, adjustment of the mA and/or kV according to patient size and/or use of iterative reconstruction technique. COMPARISON:  None Available. FINDINGS: Brain: There is generalized cerebral atrophy with widening of the extra-axial spaces and ventricular dilatation. There are areas of decreased attenuation within the white matter tracts of the supratentorial brain, consistent with microvascular disease changes. Vascular: Moderate to marked severity bilateral cavernous carotid artery calcification is noted. Skull: Normal. Negative for fracture or focal lesion. Sinuses/Orbits: No acute finding. Other: None. IMPRESSION: 1. No acute intracranial abnormality. 2. Generalized cerebral atrophy and microvascular disease changes of the supratentorial brain. Electronically Signed   By: Suzen Dials M.D.   On: 09/06/2024 19:30     Procedures    Medications Ordered in the ED - No data to display                                  Medical Decision Making Amount and/or Complexity of Data Reviewed Labs: ordered. Radiology: ordered.    83 year old female presenting to the emergency department with a chief complaint of fall in the setting of alcohol consumption.  The patient states that she was at a bar drinking too many glasses of wine when she lost her balance fell backwards and struck her head.  No loss of consciousness.  She denies any other injuries or complaints.  She was on her way to the elevator after having been drinking at a bar since 1300.  EMS had noticed unequal pupils and she was having repetitive questioning.  On arrival, the patient appears clinically sober, is GCS 15, ABC intact.  The patient arrives c-collar in place.  She denies any other injuries or complaints.  On arrival, the patient was mildly tachycardic heart rate 105 otherwise vitally stable.  Cleared clinically sober on my evaluation, ambulatory without unsteadiness of gait, appears to be back to mental baseline.  Sustained a mechanical fall in the setting of alcohol consumption today.  No loss of consciousness, she is not on anticoagulation, denies any pain or complaints.  CT head and cervical spine: IMPRESSION:  1. No acute intracranial abnormality.  2. Generalized cerebral atrophy and microvascular disease changes of  the supratentorial brain.   IMPRESSION:  1. No acute fracture or subluxation of the cervical spine.  2. Marked severity multilevel degenerative changes, as described  above.    Labs: Alcohol level 179, CBC without a leukocytosis or anemia, BMP unremarkable, CBG 105.  Patient appeared clinically sober on repeat assessment, was ambulatory in the emergency department with a steady gait.  Overall stable for discharge at this time, no other evidence of trauma on primary secondary survey, stable for outpatient follow-up.     Final  diagnoses:  Alcoholic intoxication without complication  Fall, initial encounter    ED Discharge Orders     None          Jerrol Agent, MD 09/06/24 2115

## 2024-09-06 NOTE — Discharge Instructions (Signed)
 CT imaging was negative for acute injury, your physical exam showed no other evidence of trauma and your laboratory evaluation was overall reassuring.

## 2024-09-17 ENCOUNTER — Encounter (HOSPITAL_COMMUNITY): Payer: Self-pay

## 2024-09-17 ENCOUNTER — Emergency Department (HOSPITAL_COMMUNITY)
Admission: EM | Admit: 2024-09-17 | Discharge: 2024-09-17 | Discharge status: Against Medical Advice | Attending: Emergency Medicine | Admitting: Emergency Medicine

## 2024-09-17 ENCOUNTER — Other Ambulatory Visit: Payer: Self-pay

## 2024-09-17 DIAGNOSIS — S0081XA Abrasion of other part of head, initial encounter: Secondary | ICD-10-CM | POA: Diagnosis present

## 2024-09-17 DIAGNOSIS — Z5321 Procedure and treatment not carried out due to patient leaving prior to being seen by health care provider: Secondary | ICD-10-CM | POA: Insufficient documentation

## 2024-09-17 DIAGNOSIS — W01198A Fall on same level from slipping, tripping and stumbling with subsequent striking against other object, initial encounter: Secondary | ICD-10-CM | POA: Diagnosis not present

## 2024-09-17 NOTE — ED Triage Notes (Signed)
 Pt had a mechanical fall and hit head, pt has an abrasion to right forehead. Denies LOC, no blood thinners, no blurred vision. Pt ambulatory. Axox4.

## 2024-09-17 NOTE — ED Notes (Signed)
 Pt states she is going to leave and expresses her frustration with long wait time

## 2024-09-18 ENCOUNTER — Emergency Department (HOSPITAL_BASED_OUTPATIENT_CLINIC_OR_DEPARTMENT_OTHER)

## 2024-09-18 ENCOUNTER — Other Ambulatory Visit: Payer: Self-pay

## 2024-09-18 ENCOUNTER — Encounter (HOSPITAL_BASED_OUTPATIENT_CLINIC_OR_DEPARTMENT_OTHER): Payer: Self-pay | Admitting: Emergency Medicine

## 2024-09-18 ENCOUNTER — Emergency Department (HOSPITAL_BASED_OUTPATIENT_CLINIC_OR_DEPARTMENT_OTHER)
Admission: EM | Admit: 2024-09-18 | Discharge: 2024-09-18 | Disposition: A | Discharge status: Home / Self Care | Source: Ambulatory Visit | Attending: Emergency Medicine | Admitting: Emergency Medicine

## 2024-09-18 DIAGNOSIS — W01198A Fall on same level from slipping, tripping and stumbling with subsequent striking against other object, initial encounter: Secondary | ICD-10-CM | POA: Insufficient documentation

## 2024-09-18 DIAGNOSIS — I10 Essential (primary) hypertension: Secondary | ICD-10-CM | POA: Insufficient documentation

## 2024-09-18 DIAGNOSIS — W19XXXA Unspecified fall, initial encounter: Secondary | ICD-10-CM

## 2024-09-18 DIAGNOSIS — S0990XA Unspecified injury of head, initial encounter: Secondary | ICD-10-CM | POA: Diagnosis present

## 2024-09-18 DIAGNOSIS — E119 Type 2 diabetes mellitus without complications: Secondary | ICD-10-CM | POA: Diagnosis not present

## 2024-09-18 DIAGNOSIS — S0081XA Abrasion of other part of head, initial encounter: Secondary | ICD-10-CM | POA: Insufficient documentation

## 2024-09-18 DIAGNOSIS — Z79899 Other long term (current) drug therapy: Secondary | ICD-10-CM | POA: Diagnosis not present

## 2024-09-18 DIAGNOSIS — Z7984 Long term (current) use of oral hypoglycemic drugs: Secondary | ICD-10-CM | POA: Diagnosis not present

## 2024-09-18 DIAGNOSIS — Z23 Encounter for immunization: Secondary | ICD-10-CM | POA: Insufficient documentation

## 2024-09-18 MED ORDER — TETANUS-DIPHTH-ACELL PERTUSSIS 5-2-15.5 LF-MCG/0.5 IM SUSP
0.5000 mL | Freq: Once | INTRAMUSCULAR | Status: AC
  Administered 2024-09-18: 0.5 mL via INTRAMUSCULAR
  Filled 2024-09-18: qty 0.5

## 2024-09-18 NOTE — Discharge Instructions (Signed)
 Thank you for letting us  evaluate you today.  Your CT imaging of your head did not show any bleeding.  Your neck did not appear to have any traumatic injury.  I do not see any other injuries on your physical exam.  Vital signs are stable.  We have updated your tetanus today.  Return Emergency Department if you experience any other head injury, loss of consciousness, altered mentation, dizziness at rest, worsening symptoms

## 2024-09-18 NOTE — ED Triage Notes (Signed)
 Pt via pov from home(independent living at [facility]) after a fall yesterday. She tripped over a hose and fell, hitting the front of her head. Pt states she is not on blood thinners, no LOC, no blurred vision. Pt a&o x 4; nad noted.

## 2024-09-18 NOTE — ED Provider Notes (Signed)
 [place] EMERGENCY DEPARTMENT AT [facility] Provider Note   CSN: [id number] Arrival date & time: 09/18/24  1244     Patient presents with: Vanessa Boyd is a 83 y.o. female with a past medical history of HTN, GIB, T2DM, diverticulosis presents emergency department from independent living at [facility] for evaluation of head strike following a fall yesterday.  She reports that she was walking into her friend's home when she tripped over a hose and fell hitting the front of her face on carpeted floor.  No thinners.  No complaints prior to injury.  Denies LOC, headache, vomiting, visual disturbances  {Add pertinent medical, surgical, social history, OB history to YEP:67052}  Fall       Prior to Admission medications   Medication Sig Start Date End Date Taking? Authorizing Provider  ALPRAZolam  (XANAX ) 0.25 MG tablet Take 0.25 mg by mouth 2 (two) times daily as needed.    [provider]  ascorbic acid  (VITAMIN C ) 500 MG tablet Take 500 mg by mouth daily.    [provider]  Cholecalciferol  (VITAMIN D3) 50 MCG (2000 UT) capsule Take 2,000 Units by mouth daily.     [provider]  denosumab  (PROLIA ) 60 MG/ML SOLN injection Inject 60 mg into the skin every 6 (six) months. Administer in upper arm, thigh, or abdomen    [provider]  ferrous sulfate  325 (65 FE) MG tablet Take 1 tablet (325 mg total) by mouth daily with breakfast. 04/01/24   Verdene Purchase, MD  gabapentin  (NEURONTIN ) 600 MG tablet Take 600 mg by mouth daily. 03/12/22   [provider]  metFORMIN  (GLUCOPHAGE -XR) 500 MG 24 hr tablet Take 500 mg by mouth daily with breakfast.     [provider]  Multiple Vitamins-Minerals (CENTRUM SILVER PO) Take 1 tablet by mouth daily.      [provider]  omeprazole  (PRILOSEC) 20 MG capsule TAKE 1 CAPSULE(20 MG) BY MOUTH DAILY 02/24/24   Avram Lupita BRAVO, MD  OZEMPIC, 1 MG/DOSE, 4 MG/3ML SOPN Inject 1 mg  into the skin every Wednesday. 01/07/24   [provider]  propranolol (INDERAL) 10 MG tablet Take 10 mg by mouth daily as needed. 02/24/24   [provider]  simvastatin  (ZOCOR ) 40 MG tablet Take 40 mg by mouth daily. 10/06/16   [provider]    Allergies: 5-alpha reductase inhibitors, Codeine, and Hydromorphone     Review of Systems  Skin:  Positive for wound.    Updated Vital Signs BP 132/77 (BP Location: Right Arm)   Pulse 80   Temp (!) 97.4 F (36.3 C) (Oral)   Resp 17   Ht 4' 11 (1.499 m)   Wt 59 kg   SpO2 98%   BMI 26.27 kg/m   Physical Exam Vitals and nursing note reviewed.  Constitutional:      General: She is not in acute distress.    Appearance: Normal appearance. She is not diaphoretic.  HENT:     Head: Normocephalic and atraumatic. No raccoon eyes or Battle's sign.      Comments: No hematoma nor TTP of cranium No crepitus to facial bones    Right Ear: External ear normal. No hemotympanum.     Left Ear: External ear normal. No hemotympanum.     Nose: Nose normal.     Right Nostril: No epistaxis or septal hematoma.     Left Nostril: No epistaxis or septal hematoma.     Mouth/Throat:  Mouth: Mucous membranes are moist. No injury or lacerations.  Eyes:     General: Lids are normal. Vision grossly intact. No visual field deficit.       Right eye: No discharge.        Left eye: No discharge.     Extraocular Movements: Extraocular movements intact.     Right eye: Normal extraocular motion and no nystagmus.     Left eye: Normal extraocular motion and no nystagmus.     Conjunctiva/sclera: Conjunctivae normal.     Pupils: Pupils are equal, round, and reactive to light.     Comments: No subconjunctival hemorrhage, hyphema, tear drop pupil, or fluid leakage bilaterally.  No EOM entrapment.  No nystagmus  Neck:     Vascular: No carotid bruit.  Cardiovascular:     Rate and Rhythm: Normal rate.     Pulses: Normal pulses.           Radial pulses are 2+ on the right side and 2+ on the left side.       Dorsalis pedis pulses are 2+ on the right side and 2+ on the left side.  Pulmonary:     Effort: Pulmonary effort is normal. No respiratory distress.     Breath sounds: Normal breath sounds. No wheezing.  Chest:     Chest wall: No tenderness.  Abdominal:     General: Bowel sounds are normal. There is no distension.     Palpations: Abdomen is soft.     Tenderness: There is no abdominal tenderness. There is no guarding or rebound.  Musculoskeletal:     Cervical back: Full passive range of motion without pain, normal range of motion and neck supple. No deformity, rigidity or bony tenderness. Normal range of motion.     Thoracic back: No deformity or bony tenderness. Normal range of motion.     Lumbar back: No deformity or bony tenderness. Normal range of motion.     Right hip: No bony tenderness or crepitus.     Left hip: No bony tenderness or crepitus.     Right lower leg: No edema.     Left lower leg: No edema.     Comments: No obvious deformity to joints or long bones Pelvis stable with no shortening or rotation of LE bilaterally  Skin:    General: Skin is warm and dry.     Capillary Refill: Capillary refill takes less than 2 seconds.     Coloration: Skin is not jaundiced or pale.  Neurological:     General: No focal deficit present.     Mental Status: She is alert and oriented to person, place, and time. Mental status is at baseline.     GCS: GCS eye subscore is 4. GCS verbal subscore is 5. GCS motor subscore is 6.     Cranial Nerves: Cranial nerves 2-12 are intact. No cranial nerve deficit, dysarthria or facial asymmetry.     Sensory: Sensation is intact. No sensory deficit.     Motor: Motor function is intact. No weakness, tremor, atrophy, abnormal muscle tone, seizure activity or pronator drift.     Coordination: Coordination is intact. Coordination normal. Finger-Nose-Finger Test and Heel to [facility] Test normal.      Gait: Gait is intact. Gait normal.     Deep Tendon Reflexes: Reflexes are normal and symmetric. Reflexes normal.     Comments: A&Ox3. following commands appropriately.  Motor 5/5 and sensation 2/2 BUE and BLE.  No visual deficits.  No  speech deficits.  Denies dizziness.     (all labs ordered are listed, but only abnormal results are displayed) Labs Reviewed - No data to display  EKG: None  Radiology: No results found.  {Document cardiac monitor, telemetry assessment procedure when appropriate:32947} Procedures   Medications Ordered in the ED  Tdap (ADACEL) injection 0.5 mL (0.5 mLs Intramuscular Given 09/18/24 1519)      {Click here for ABCD2, HEART and other calculators REFRESH Note before signing:1}                              Medical Decision Making Amount and/or Complexity of Data Reviewed Radiology: ordered.  Risk Prescription drug management.   Patient presents to the ED for concern of head injury following a fall, this involves an extensive number of treatment options, and is a complaint that carries with it a high risk of complications and morbidity.  The differential diagnosis includes ICH, laceration, abrasion, contusion, concussion   Co morbidities that complicate the patient evaluation  None   Additional history obtained:  Additional history obtained from Nursing   External records from outside source obtained and reviewed including triage RN note    Imaging Studies ordered:  I ordered imaging studies including CT head, cervical spine I independently visualized and interpreted imaging which showed *** I agree with the radiologist interpretation     Problem List / ED Course:  Head injury Physical exam notable for 2 abrasions above right eyebrow.  Not hemorrhaging.  No surrounding erythema nor signs of infection.  No crepitus to facial bones.  No nystagmus or signs of EOM entrapment.  No other signs of trauma on physical exam.  No ecchymosis  to chest, abdomen, back.  No chest pain or abdominal pain.  Low suspicion for intra-abdominal nor intrathoracic injury.  No midline spine tenderness. Denies complaints prior to injury.  Do not feel that lab work is required at this time as it sounds like the fall is mechanical in nature Neurological exam is normal with no speech nor visual disturbances.  Patient is able to walk around room without difficulty nor requiring assistance. CT imaging pending to rule out ICH following fall yesterday.  However, assuming CT imaging is negative, plan to discharge patient home to independent living   Reevaluation:  After the interventions noted above, I reevaluated the patient and found that they have :stayed the same   Dispostion:  After consideration of the diagnostic results and the patients response to treatment, I feel that the patent would benefit from outpatient management symptomatic treatment.   Discussed ED workup, disposition, return to ED precautions with patient who expresses understanding agrees with plan.  All questions answered to their satisfaction.  They are agreeable to plan.  Discharge instructions provided on paperwork  Final diagnoses:  Injury of head, initial encounter  Fall, initial encounter    ED Discharge Orders     None

## 2024-10-10 ENCOUNTER — Emergency Department (HOSPITAL_COMMUNITY)
Admission: EM | Admit: 2024-10-10 | Discharge: 2024-10-11 | Disposition: A | Attending: Emergency Medicine | Admitting: Emergency Medicine

## 2024-10-10 ENCOUNTER — Other Ambulatory Visit: Payer: Self-pay

## 2024-10-10 DIAGNOSIS — Z7984 Long term (current) use of oral hypoglycemic drugs: Secondary | ICD-10-CM | POA: Insufficient documentation

## 2024-10-10 DIAGNOSIS — I10 Essential (primary) hypertension: Secondary | ICD-10-CM | POA: Diagnosis not present

## 2024-10-10 DIAGNOSIS — R739 Hyperglycemia, unspecified: Secondary | ICD-10-CM

## 2024-10-10 DIAGNOSIS — E1165 Type 2 diabetes mellitus with hyperglycemia: Secondary | ICD-10-CM | POA: Insufficient documentation

## 2024-10-10 DIAGNOSIS — R002 Palpitations: Secondary | ICD-10-CM | POA: Diagnosis present

## 2024-10-10 NOTE — ED Provider Notes (Signed)
 " [name] [facility] AT [facility] Provider Note   CSN: [id number] Arrival date & time: 10/10/24  2324     Patient presents with: Palpitations   Vanessa Boyd is a 83 y.o. female.  {Add pertinent medical, surgical, social history, OB history to YEP:[id number]} The history is provided by the patient.  Palpitations  She has history of hypertension, diabetes, hyperlipidemia, Graves' disease and comes in because of an episode of palpitations.  She states that she felt funny and that her heart was racing.  This only lasted a few minutes.  She denies chest pain, heaviness, tightness, pressure.  She denies any dizziness or lightheadedness.  She denies nausea, vomiting, dyspnea, diaphoresis.  She states that she will drink 2 or 3 glasses of tea a day, did not have any more than normal.  She thinks she might of had 1 glass of wine and usually drinks 1 or 2 glasses of wine a day.  She denies taking any stimulants and has been compliant with all of her medications.    Prior to Admission medications  Medication Sig Start Date End Date Taking? Authorizing Provider  ALPRAZolam  (XANAX ) 0.25 MG tablet Take 0.25 mg by mouth 2 (two) times daily as needed.    [provider]  ascorbic acid  (VITAMIN C ) 500 MG tablet Take 500 mg by mouth daily.    [provider]  Cholecalciferol  (VITAMIN D3) 50 MCG (2000 UT) capsule Take 2,000 Units by mouth daily.     [provider]  denosumab  (PROLIA ) 60 MG/ML SOLN injection Inject 60 mg into the skin every 6 (six) months. Administer in upper arm, thigh, or abdomen    [provider]  ferrous sulfate  325 (65 FE) MG tablet Take 1 tablet (325 mg total) by mouth daily with breakfast. 04/01/24   Verdene Purchase, MD  gabapentin  (NEURONTIN ) 600 MG tablet Take 600 mg by mouth daily. 03/12/22   [provider]  metFORMIN  (GLUCOPHAGE -XR) 500 MG 24 hr tablet Take 500 mg by mouth daily with breakfast.     [provider]  Multiple Vitamins-Minerals (CENTRUM SILVER PO) Take 1 tablet by mouth daily.      [provider]  omeprazole  (PRILOSEC) 20 MG capsule TAKE 1 CAPSULE(20 MG) BY MOUTH DAILY 02/24/24   Avram Lupita BRAVO, MD  OZEMPIC, 1 MG/DOSE, 4 MG/3ML SOPN Inject 1 mg into the skin every Wednesday. 01/07/24   [provider]  propranolol (INDERAL) 10 MG tablet Take 10 mg by mouth daily as needed. 02/24/24   [provider]  simvastatin  (ZOCOR ) 40 MG tablet Take 40 mg by mouth daily. 10/06/16   [provider]    Allergies: 5-alpha reductase inhibitors, Codeine, and Hydromorphone     Review of Systems  Cardiovascular:  Positive for palpitations.  All other systems reviewed and are negative.   Updated Vital Signs BP (!) 122/59 (BP Location: Right Arm)   Pulse 92   Temp 97.6 F (36.4 C) (Oral)   Resp (!) 28   Ht 4' 11 (1.499 m)   Wt 59 kg   SpO2 98%   BMI 26.27 kg/m   Physical Exam Vitals and nursing note reviewed.   83 year old female, resting comfortably and in no acute distress. Vital signs are significant for elevated respiratory rate. Oxygen saturation is 98%, which is normal. Head is normocephalic and atraumatic. PERRLA, EOMI.  Lungs are clear without rales, wheezes, or rhonchi. Chest is nontender. Heart has regular rate and rhythm  with 1-2/6 systolic ejection murmur heard at the upper right sternal border. Abdomen is soft, flat, nontender. Extremities have no cyanosis or edema, full range of motion is present. Skin is warm and dry without rash. Neurologic: Mental status is normal, cranial nerves are intact, moves all extremities equally.  (all labs ordered are listed, but only abnormal results are displayed) Labs Reviewed - No data to display  EKG: None  Radiology: No results found.  Cardiac monitor shows normal sinus rhythm, per my interpretation.  Procedures   Medications Ordered in the ED - No data to display    {Click here  for ABCD2, HEART and other calculators REFRESH Note before signing:1}                              Medical Decision Making  Palpitations which were self-limited.  Differential diagnosis includes, but is not limited to, paroxysmal supraventricular tachycardia, paroxysmal atrial fibrillation, paroxysmal atrial flutter, sinus tachycardia.  I have ordered screening labs including TSH and magnesium  and have ordered an electrocardiogram.  I have reviewed her past records, and note echocardiogram on 05/04/2022 showing left ventricular ejection fraction 55-60% with indeterminant left ventricular diastolic parameters and mild aortic stenosis.  She had a nuclear stress test on 06/05/2022 which was normal.  {Document critical care time when appropriate  Document review of labs and clinical decision tools ie CHADS2VASC2, etc  Document your independent review of radiology images and any outside records  Document your discussion with family members, caretakers and with consultants  Document social determinants of health affecting pt's care  Document your decision making why or why not admission, treatments were needed:32947:::1}   Final diagnoses:  None    ED Discharge Orders     None        "

## 2024-10-10 NOTE — ED Triage Notes (Signed)
 Pt BIB [facility] from [place] d/t palpitations for 15 minutes.  Not having now.   BP 111/64 95 HR 95% RA CBG 198  20 G LFA  EMS gave 500 CC NS

## 2024-10-11 LAB — MAGNESIUM: Magnesium: 1.7 mg/dL (ref 1.7–2.4)

## 2024-10-11 LAB — CBC WITH DIFFERENTIAL/PLATELET
Abs Immature Granulocytes: 0.02 K/uL (ref 0.00–0.07)
Basophils Absolute: 0 K/uL (ref 0.0–0.1)
Basophils Relative: 1 %
Eosinophils Absolute: 0.2 K/uL (ref 0.0–0.5)
Eosinophils Relative: 4 %
HCT: 40.8 % (ref 36.0–46.0)
Hemoglobin: 13.6 g/dL (ref 12.0–15.0)
Immature Granulocytes: 0 %
Lymphocytes Relative: 28 %
Lymphs Abs: 1.5 K/uL (ref 0.7–4.0)
MCH: 33.3 pg (ref 26.0–34.0)
MCHC: 33.3 g/dL (ref 30.0–36.0)
MCV: 99.8 fL (ref 80.0–100.0)
Monocytes Absolute: 0.6 K/uL (ref 0.1–1.0)
Monocytes Relative: 12 %
Neutro Abs: 2.8 K/uL (ref 1.7–7.7)
Neutrophils Relative %: 55 %
Platelets: 172 K/uL (ref 150–400)
RBC: 4.09 MIL/uL (ref 3.87–5.11)
RDW: 12.5 % (ref 11.5–15.5)
WBC: 5.2 K/uL (ref 4.0–10.5)
nRBC: 0 % (ref 0.0–0.2)

## 2024-10-11 LAB — BASIC METABOLIC PANEL WITH GFR
Anion gap: 9 (ref 5–15)
BUN: 15 mg/dL (ref 8–23)
CO2: 26 mmol/L (ref 22–32)
Calcium: 8.9 mg/dL (ref 8.9–10.3)
Chloride: 105 mmol/L (ref 98–111)
Creatinine, Ser: 0.68 mg/dL (ref 0.44–1.00)
GFR, Estimated: >60 mL/min
Glucose, Bld: 125 mg/dL — ABNORMAL HIGH (ref 70–99)
Potassium: 4.1 mmol/L (ref 3.5–5.1)
Sodium: 139 mmol/L (ref 135–145)

## 2024-10-11 LAB — TSH: TSH: 2.09 u[IU]/mL (ref 0.350–4.500)

## 2024-10-11 MED ORDER — MAGNESIUM SULFATE 2 GM/50ML IV SOLN
2.0000 g | Freq: Once | INTRAVENOUS | Status: AC
  Administered 2024-10-11: 2 g via INTRAVENOUS
  Filled 2024-10-11: qty 50

## 2024-10-11 NOTE — Discharge Instructions (Addendum)
 Please follow-up with the cardiology office regarding your episode of palpitations.  If you have recurrence, you are welcome to return to the emergency department for reevaluation.

## 2024-10-13 ENCOUNTER — Ambulatory Visit: Attending: Cardiology | Admitting: Cardiology

## 2024-10-13 ENCOUNTER — Ambulatory Visit

## 2024-10-13 ENCOUNTER — Encounter: Payer: Self-pay | Admitting: Cardiology

## 2024-10-13 VITALS — BP 112/68 | HR 68 | Ht 60.0 in | Wt 129.6 lb

## 2024-10-13 DIAGNOSIS — I10 Essential (primary) hypertension: Secondary | ICD-10-CM

## 2024-10-13 DIAGNOSIS — I350 Nonrheumatic aortic (valve) stenosis: Secondary | ICD-10-CM | POA: Diagnosis not present

## 2024-10-13 DIAGNOSIS — I2510 Atherosclerotic heart disease of native coronary artery without angina pectoris: Secondary | ICD-10-CM

## 2024-10-13 DIAGNOSIS — R002 Palpitations: Secondary | ICD-10-CM

## 2024-10-13 DIAGNOSIS — E782 Mixed hyperlipidemia: Secondary | ICD-10-CM

## 2024-10-13 NOTE — Patient Instructions (Addendum)
 Medication Instructions:  NO CHANGES *If you need a refill on your cardiac medications before your next appointment, please call your pharmacy*  Lab Work: NO LABS If you have labs (blood work) drawn today and your tests are completely normal, you will receive your results only by: MyChart Message (if you have MyChart) OR A paper copy in the mail If you have any lab test that is abnormal or we need to change your treatment, we will call you to review the results.  Testing/Procedures:[address] Your physician has requested that you have an echocardiogram. Echocardiography is a painless test that uses sound waves to create images of your heart. It provides your doctor with information about the size and shape of your heart and how well your hearts chambers and valves are working. This procedure takes approximately one hour. There are no restrictions for this procedure. Please do NOT wear cologne, perfume, aftershave, or lotions (deodorant is allowed). Please arrive 15 minutes prior to your appointment time.  Please note: We ask at that you not bring children with you during ultrasound (echo/ vascular) testing. Due to room size and safety concerns, children are not allowed in the ultrasound rooms during exams. Our front office staff cannot provide observation of children in our lobby area while testing is being conducted. An adult accompanying a patient to their appointment will only be allowed in the ultrasound room at the discretion of the ultrasound technician under special circumstances. We apologize for any inconvenience.   Follow-Up: At [facility], you and your health needs are our priority.  As part of our continuing mission to provide you with exceptional heart care, our providers are all part of one team.  This team includes your primary Cardiologist (physician) and Advanced Practice Providers or APPs (Physician Assistants and Nurse Practitioners) who all work together to  provide you with the care you need, when you need it.  Your next appointment:   16 week(s)  Provider:   Kardie Tobb, DO  Other Instructions ZIO XT- Long Term Monitor Instructions  Your physician has requested you wear a ZIO patch monitor for 14 days.  This is a single patch monitor. [employer] supplies one patch monitor per enrollment. Additional stickers are not available. Please do not apply patch if you will be having a Nuclear Stress Test,  Echocardiogram, Cardiac CT, MRI, or Chest Xray during the period you would be wearing the  monitor. The patch cannot be worn during these tests. You cannot remove and re-apply the  ZIO XT patch monitor.  Your ZIO patch monitor will be mailed 3 day USPS to your address on file. It may take 3-5 days  to receive your monitor after you have been enrolled.  Once you have received your monitor, please review the enclosed instructions. Your monitor  has already been registered assigning a specific monitor serial # to you.  Billing and Patient Assistance Program Information  We have supplied [employer] with any of your insurance information on file for billing purposes. [employer] offers a sliding scale Patient Assistance Program for patients that do not have  insurance, or whose insurance does not completely cover the cost of the ZIO monitor.  You must apply for the Patient Assistance Program to qualify for this discounted rate.  To apply, please call [employer] at [phone], select option 4, select option 2, ask to apply for  Patient Assistance Program. Meredeth will ask your household income, and how many people  are in your household. They will  quote your out-of-pocket cost based on that information.  [employer] will also be able to set up a 62-month, interest-free payment plan if needed.  Applying the monitor   Shave hair from upper left chest.  Hold abrader disc by orange tab. Rub abrader in 40 strokes over the upper left chest as  indicated in your  monitor instructions.  Clean area with 4 enclosed alcohol pads. Let dry.  Apply patch as indicated in monitor instructions. Patch will be placed under collarbone on left  side of chest with arrow pointing upward.  Rub patch adhesive wings for 2 minutes. Remove white label marked 1. Remove the white  label marked 2. Rub patch adhesive wings for 2 additional minutes.  While looking in a mirror, press and release button in center of patch. A small green light will  flash 3-4 times. This will be your only indicator that the monitor has been turned on.  Do not shower for the first 24 hours. You may shower after the first 24 hours.  Press the button if you feel a symptom. You will hear a small click. Record Date, Time and  Symptom in the Patient Logbook.  When you are ready to remove the patch, follow instructions on the last 2 pages of Patient  Logbook. Stick patch monitor onto the last page of Patient Logbook.  Place Patient Logbook in the blue and white box. Use locking tab on box and tape box closed  securely. The blue and white box has prepaid postage on it. Please place it in the mailbox as  soon as possible. Your physician should have your test results approximately 7 days after the  monitor has been mailed back to [facility].  Call [facility] Customer Care at [phone] if you have questions regarding  your ZIO XT patch monitor. Call them immediately if you see an orange light blinking on your  monitor.  If your monitor falls off in less than 4 days, contact our Monitor department at [phone].  If your monitor becomes loose or falls off after 4 days call [employer] at [phone] for  suggestions on securing your monitor

## 2024-10-13 NOTE — Progress Notes (Unsigned)
 Enrolled patient for a 14 day Zio XT  monitor to be mailed to patients home

## 2024-10-13 NOTE — Progress Notes (Signed)
 " Cardiology Office Note:    Date:  10/13/2024   ID:  Amonie Wisser Biederman, DOB 01-19-1941, MRN [id number]  PCP:  Yolande Toribio MATSU, MD  Cardiologist:  None  Electrophysiologist:  None   Referring MD: Raford Lenis, MD      History of Present Illness:    Vanessa Boyd is a 83 y.o. female with a hx of hypertension, hyperlipidemia, coronary artery calcification seen on coronary CT calcium  score back in 2023, mild to moderate aortic stenosis.  She is here to reestablish care.  She was seen several years ago giving tachycardic episode.  At that time she did see Dr. Delford.  Her tachycardia episode had resolved therefore they talked about her getting an echocardiogram she had an echocardiogram which showed mild to moderate aortic stenosis, she had a coronary calcium  score which did show coronary artery calcification.  Since she had not followed with our office.  She was seen in the emergency department on October 18, 2024 at that time she presented due to significant palpitations.  She was worked up with no reason for admission and asked to see cardiology.  She shares with me that she was experiencing heart racing over the past week and abrupt onset last for minutes at a time.  He felt significantly funny and fluttery.  She called 911 and EMS came.  We discussed her medical condition she is surprised that her type 2 diabetes on her chart for Graves' disease we discussed that she claims that she was not told by any of these issues.  She lives at [facility].  Past Medical History:  Diagnosis Date   Abnormal bone density screening    Diverticulitis    Graves' disease    Hyperlipidemia    Hypertension    Hyperthyroidism    Hypoglycemia, unspecified    IBS (irritable bowel syndrome)    Obesity    Osteoporosis    Palpitations    Post-menopausal    Spinal stenosis 2017   Tachycardia    Tobacco use     Past Surgical History:  Procedure Laterality Date   ABDOMINAL  HYSTERECTOMY     APPENDECTOMY     BACK SURGERY  05/2021   COLONOSCOPY WITH PROPOFOL  N/A 07/27/2022   Procedure: COLONOSCOPY WITH PROPOFOL ;  Surgeon: Federico Rosario BROCKS, MD;  Location: [facility] ENDOSCOPY;  Service: Gastroenterology;  Laterality: N/A;   EYE SURGERY     bilateral cataract surgery   OOPHORECTOMY     orif left elbow     SHOULDER OPEN ROTATOR CUFF REPAIR     TONSILLECTOMY      Current Medications: Active Medications[1]   Allergies:   5-alpha reductase inhibitors, Codeine, and Hydromorphone    Social History   Socioeconomic History   Marital status: Widowed    Spouse name: Not on file   Number of children: 3   Years of education: 30   Highest education level: Not on file  Occupational History   Occupation: travel agent    Comment: retired  Tobacco Use   Smoking status: Former    Current packs/day: 0.00    Types: Cigarettes    Start date: 10/22/1978    Quit date: 10/22/1998    Years since quitting: 25.9   Smokeless tobacco: Never  Vaping Use   Vaping status: Never Used  Substance and Sexual Activity   Alcohol use: Yes    Alcohol/week: 2.0 standard drinks of alcohol    Types: 2 Standard drinks or equivalent per week  Comment: occasionally   Drug use: No   Sexual activity: Not Currently  Other Topics Concern   Not on file  Social History Narrative   Married '62 - widowed '85; 3 sons - '63, '64, '66; 6 grandchildren. [employer]G. Worked as a firefighter. Lives alone. No pets. Hobbies - walking, gym, needle point. Has a good support network. End- of- Life: OK for CPR, no prolonged ventilator support, no prolong heroic or futile measures.    Social Drivers of Health   Tobacco Use: Medium Risk (10/13/2024)   Patient History    Smoking Tobacco Use: Former    Smokeless Tobacco Use: Never    Passive Exposure: Not on file  Financial Resource Strain: Not on file  Food Insecurity: No Food Insecurity (03/25/2024)   Hunger Vital Sign    Worried About Running Out of Food in the Last  Year: Never true    Ran Out of Food in the Last Year: Never true  Transportation Needs: No Transportation Needs (03/25/2024)   PRAPARE - Administrator, Civil Service (Medical): No    Lack of Transportation (Non-Medical): No  Physical Activity: Not on file  Stress: Not on file  Social Connections: Patient Declined (03/26/2024)   Social Connection and Isolation Panel    Frequency of Communication with Friends and Family: Patient declined    Frequency of Social Gatherings with Friends and Family: Patient declined    Attends Religious Services: Patient declined    Database Administrator or Organizations: Patient declined    Attends Banker Meetings: Patient declined    Marital Status: Patient declined  Depression (PHQ2-9): Not on file  Alcohol Screen: Not on file  Housing: Low Risk (03/25/2024)   Housing Stability Vital Sign    Unable to Pay for Housing in the Last Year: No    Number of Times Moved in the Last Year: 0    Homeless in the Last Year: No  Utilities: Not At Risk (03/25/2024)   AHC Utilities    Threatened with loss of utilities: No  Health Literacy: Not on file     Family History: The patient's family history includes COPD in her mother; Hyperlipidemia in her mother and sister; Hypertension in her sister. There is no history of Diabetes, Heart disease, Colon cancer, Rectal cancer, Stomach cancer, or Esophageal cancer.  ROS:   Review of Systems  Constitution: Negative for decreased appetite, fever and weight gain.  HENT: Negative for congestion, ear discharge, hoarse voice and sore throat.   Eyes: Negative for discharge, redness, vision loss in right eye and visual halos.  Cardiovascular: Reports palpitations.  Negative for chest pain, dyspnea on exertion, leg swelling, orthopnea  Respiratory: Negative for cough, hemoptysis, shortness of breath and snoring.   Endocrine: Negative for heat intolerance and polyphagia.  Hematologic/Lymphatic: Negative for  bleeding problem. Does not bruise/bleed easily.  Skin: Negative for flushing, nail changes, rash and suspicious lesions.  Musculoskeletal: Negative for arthritis, joint pain, muscle cramps, myalgias, neck pain and stiffness.  Gastrointestinal: Negative for abdominal pain, bowel incontinence, diarrhea and excessive appetite.  Genitourinary: Negative for decreased libido, genital sores and incomplete emptying.  Neurological: Negative for brief paralysis, focal weakness, headaches and loss of balance.  Psychiatric/Behavioral: Negative for altered mental status, depression and suicidal ideas.  Allergic/Immunologic: Negative for HIV exposure and persistent infections.    EKGs/Labs/Other Studies Reviewed:    The following studies were reviewed today:   EKG: EKG from ED visit reviewed sinus rhythm.  Recent Labs: 03/25/2024: ALT 18 10/10/2024: BUN 15; Creatinine, Ser 0.68; Hemoglobin 13.6; Magnesium  1.7; Platelets 172; Potassium 4.1; Sodium 139; TSH 2.090  Recent Lipid Panel No results found for: CHOL, TRIG, HDL, CHOLHDL, VLDL, LDLCALC, LDLDIRECT  Physical Exam:    VS:  BP 112/68   Pulse 68   Ht 5' (1.524 m)   Wt 129 lb 9.6 oz (58.8 kg)   SpO2 95%   BMI 25.31 kg/m     Wt Readings from Last 3 Encounters:  10/13/24 129 lb 9.6 oz (58.8 kg)  10/10/24 130 lb 1.1 oz (59 kg)  09/18/24 130 lb 1.1 oz (59 kg)     GEN: Well nourished, well developed in no acute distress HEENT: Normal NECK: No JVD; No carotid bruits LYMPHATICS: No lymphadenopathy CARDIAC: S1S2 noted,RRR, no murmurs, rubs, gallops RESPIRATORY:  Clear to auscultation without rales, wheezing or rhonchi  ABDOMEN: Soft, non-tender, non-distended, +bowel sounds, no guarding. EXTREMITIES: No edema, No cyanosis, no clubbing MUSCULOSKELETAL:  No deformity  SKIN: Warm and dry NEUROLOGIC:  Alert and oriented x 3, non-focal PSYCHIATRIC:  Normal affect, good insight  ASSESSMENT:    1. Palpitations   2. Aortic valve  stenosis, etiology of cardiac valve disease unspecified   3. Coronary artery calcification seen on CAT scan   4. Mixed hyperlipidemia   5. Essential hypertension    PLAN:    Palpitations-Will place a monitor on the patient for 14 days to rule out any arrhythmia.  Known mild to moderate aortic stenosis will repeat echocardiogram to assess her aortic valve.  This is being managed by his primary care doctor.  No adjustments for antidiabetic medications were made today.   Hyperlipidemia - continue with current statin medication.  Peripheral artery disease is noted on her chart she denies any symptoms or any knowledge of this.  Coronary artery calcification seen on CT scan done in 2023 continue current dose of simvastatin .  She tells me a lipid profile was done by her PCP and that was noted to be normal LDL goal should be less than 70 in this patient.  Will request her lab work.   The patient is in agreement with the above plan. The patient left the office in stable condition.  The patient will follow up in   Medication Adjustments/Labs and Tests Ordered: Current medicines are reviewed at length with the patient today.  Concerns regarding medicines are outlined above.  Orders Placed This Encounter  Procedures   LONG TERM MONITOR (3-14 DAYS)   ECHOCARDIOGRAM COMPLETE   No orders of the defined types were placed in this encounter.   Patient Instructions  Medication Instructions:  NO CHANGES *If you need a refill on your cardiac medications before your next appointment, please call your pharmacy*  Lab Work: NO LABS If you have labs (blood work) drawn today and your tests are completely normal, you will receive your results only by: MyChart Message (if you have MyChart) OR A paper copy in the mail If you have any lab test that is abnormal or we need to change your treatment, we will call you to review the results.  Testing/Procedures:[address] Your physician has  requested that you have an echocardiogram. Echocardiography is a painless test that uses sound waves to create images of your heart. It provides your doctor with information about the size and shape of your heart and how well your hearts chambers and valves are working. This procedure takes approximately one hour. There are no restrictions for this  procedure. Please do NOT wear cologne, perfume, aftershave, or lotions (deodorant is allowed). Please arrive 15 minutes prior to your appointment time.  Please note: We ask at that you not bring children with you during ultrasound (echo/ vascular) testing. Due to room size and safety concerns, children are not allowed in the ultrasound rooms during exams. Our front office staff cannot provide observation of children in our lobby area while testing is being conducted. An adult accompanying a patient to their appointment will only be allowed in the ultrasound room at the discretion of the ultrasound technician under special circumstances. We apologize for any inconvenience.   Follow-Up: At [facility], you and your health needs are our priority.  As part of our continuing mission to provide you with exceptional heart care, our providers are all part of one team.  This team includes your primary Cardiologist (physician) and Advanced Practice Providers or APPs (Physician Assistants and Nurse Practitioners) who all work together to provide you with the care you need, when you need it.  Your next appointment:   16 week(s)  Provider:   Kelse Ploch, DO  Other Instructions ZIO XT- Long Term Monitor Instructions  Your physician has requested you wear a ZIO patch monitor for 14 days.  This is a single patch monitor. [employer] supplies one patch monitor per enrollment. Additional stickers are not available. Please do not apply patch if you will be having a Nuclear Stress Test,  Echocardiogram, Cardiac CT, MRI, or Chest Xray during the period you would be  wearing the  monitor. The patch cannot be worn during these tests. You cannot remove and re-apply the  ZIO XT patch monitor.  Your ZIO patch monitor will be mailed 3 day USPS to your address on file. It may take 3-5 days  to receive your monitor after you have been enrolled.  Once you have received your monitor, please review the enclosed instructions. Your monitor  has already been registered assigning a specific monitor serial # to you.  Billing and Patient Assistance Program Information  We have supplied [employer] with any of your insurance information on file for billing purposes. [employer] offers a sliding scale Patient Assistance Program for patients that do not have  insurance, or whose insurance does not completely cover the cost of the ZIO monitor.  You must apply for the Patient Assistance Program to qualify for this discounted rate.  To apply, please call [employer] at [phone], select option 4, select option 2, ask to apply for  Patient Assistance Program. Meredeth will ask your household income, and how many people  are in your household. They will quote your out-of-pocket cost based on that information.  [employer] will also be able to set up a 110-month, interest-free payment plan if needed.  Applying the monitor   Shave hair from upper left chest.  Hold abrader disc by orange tab. Rub abrader in 40 strokes over the upper left chest as  indicated in your monitor instructions.  Clean area with 4 enclosed alcohol pads. Let dry.  Apply patch as indicated in monitor instructions. Patch will be placed under collarbone on left  side of chest with arrow pointing upward.  Rub patch adhesive wings for 2 minutes. Remove white label marked 1. Remove the white  label marked 2. Rub patch adhesive wings for 2 additional minutes.  While looking in a mirror, press and release button in center of patch. A small green light will  flash 3-4 times. This will be your  only indicator that the  monitor has been turned on.  Do not shower for the first 24 hours. You may shower after the first 24 hours.  Press the button if you feel a symptom. You will hear a small click. Record Date, Time and  Symptom in the Patient Logbook.  When you are ready to remove the patch, follow instructions on the last 2 pages of Patient  Logbook. Stick patch monitor onto the last page of Patient Logbook.  Place Patient Logbook in the blue and white box. Use locking tab on box and tape box closed  securely. The blue and white box has prepaid postage on it. Please place it in the mailbox as  soon as possible. Your physician should have your test results approximately 7 days after the  monitor has been mailed back to [facility].  Call [facility] Customer Care at [phone] if you have questions regarding  your ZIO XT patch monitor. Call them immediately if you see an orange light blinking on your  monitor.  If your monitor falls off in less than 4 days, contact our Monitor department at [phone].  If your monitor becomes loose or falls off after 4 days call [employer] at [phone] for  suggestions on securing your monitor          Adopting a Healthy Lifestyle.  Know what a healthy weight is for you (roughly BMI <25) and aim to maintain this   Aim for 7+ servings of fruits and vegetables daily   65-80+ fluid ounces of water or unsweet tea for healthy kidneys   Limit to max 1 drink of alcohol per day; avoid smoking/tobacco   Limit animal fats in diet for cholesterol and heart health - choose grass fed whenever available   Avoid highly processed foods, and foods high in saturated/trans fats   Aim for low stress - take time to unwind and care for your mental health   Aim for 150 min of moderate intensity exercise weekly for heart health, and weights twice weekly for bone health   Aim for 7-9 hours of sleep daily   When it comes to diets, agreement about the perfect plan isnt  easy to find, even among the experts. Experts at the [facility] [employer] developed an idea known as the Healthy Eating Plate. Just imagine a plate divided into logical, healthy portions.   The emphasis is on diet quality:   Load up on vegetables and fruits - one-half of your plate: Aim for color and variety, and remember that potatoes dont count.   Go for whole grains - one-quarter of your plate: Whole wheat, barley, wheat berries, quinoa, oats, brown rice, and foods made with them. If you want pasta, go with whole wheat pasta.   Protein power - one-quarter of your plate: Fish, chicken, beans, and nuts are all healthy, versatile protein sources. Limit red meat.   The diet, however, does go beyond the plate, offering a few other suggestions.   Use healthy plant oils, such as olive, canola, soy, corn, sunflower and peanut. Check the labels, and avoid partially hydrogenated oil, which have unhealthy trans fats.   If youre thirsty, drink water. Coffee and tea are good in moderation, but skip sugary drinks and limit milk and dairy products to one or two daily servings.   The type of carbohydrate in the diet is more important than the amount. Some sources of carbohydrates, such as vegetables, fruits, whole grains, and beans-are healthier than others.  Finally, stay active  Signed, Clotilde Loth, DO  10/13/2024 2:52 PM    [facility]    [1]  Current Meds  Medication Sig   ALPRAZolam  (XANAX ) 0.25 MG tablet Take 0.25 mg by mouth 2 (two) times daily as needed.   ascorbic acid  (VITAMIN C ) 500 MG tablet Take 500 mg by mouth daily.   Cholecalciferol  (VITAMIN D3) 50 MCG (2000 UT) capsule Take 2,000 Units by mouth daily.    denosumab  (PROLIA ) 60 MG/ML SOLN injection Inject 60 mg into the skin every 6 (six) months. Administer in upper arm, thigh, or abdomen   ferrous sulfate  325 (65 FE) MG tablet Take 1 tablet (325 mg total) by mouth daily with breakfast.    gabapentin  (NEURONTIN ) 600 MG tablet Take 600 mg by mouth daily.   metFORMIN  (GLUCOPHAGE -XR) 500 MG 24 hr tablet Take 500 mg by mouth daily with breakfast.    Multiple Vitamins-Minerals (CENTRUM SILVER PO) Take 1 tablet by mouth daily.     omeprazole  (PRILOSEC) 20 MG capsule TAKE 1 CAPSULE(20 MG) BY MOUTH DAILY   OZEMPIC, 1 MG/DOSE, 4 MG/3ML SOPN Inject 1 mg into the skin every Wednesday.   propranolol (INDERAL) 10 MG tablet Take 10 mg by mouth daily as needed.   simvastatin  (ZOCOR ) 40 MG tablet Take 40 mg by mouth daily.   "

## 2024-11-13 DIAGNOSIS — R002 Palpitations: Secondary | ICD-10-CM | POA: Diagnosis not present

## 2024-11-23 ENCOUNTER — Ambulatory Visit (HOSPITAL_COMMUNITY)
